# Patient Record
Sex: Male | Born: 1935 | Race: Black or African American | Hispanic: No | Marital: Married | Smoking: Former smoker
Health system: Southern US, Community
[De-identification: ages and names within clinical notes are randomized; demographics above are authoritative.]

## PROBLEM LIST (undated history)

## (undated) DIAGNOSIS — H5440 Blindness, one eye, unspecified eye: Secondary | ICD-10-CM

## (undated) DIAGNOSIS — D126 Benign neoplasm of colon, unspecified: Secondary | ICD-10-CM

## (undated) DIAGNOSIS — Z9289 Personal history of other medical treatment: Secondary | ICD-10-CM

## (undated) DIAGNOSIS — F329 Major depressive disorder, single episode, unspecified: Secondary | ICD-10-CM

## (undated) DIAGNOSIS — J189 Pneumonia, unspecified organism: Secondary | ICD-10-CM

## (undated) DIAGNOSIS — F419 Anxiety disorder, unspecified: Secondary | ICD-10-CM

## (undated) DIAGNOSIS — C61 Malignant neoplasm of prostate: Secondary | ICD-10-CM

## (undated) DIAGNOSIS — E785 Hyperlipidemia, unspecified: Secondary | ICD-10-CM

## (undated) DIAGNOSIS — I10 Essential (primary) hypertension: Secondary | ICD-10-CM

## (undated) DIAGNOSIS — M1990 Unspecified osteoarthritis, unspecified site: Secondary | ICD-10-CM

## (undated) DIAGNOSIS — K5900 Constipation, unspecified: Secondary | ICD-10-CM

## (undated) DIAGNOSIS — K219 Gastro-esophageal reflux disease without esophagitis: Secondary | ICD-10-CM

## (undated) DIAGNOSIS — E119 Type 2 diabetes mellitus without complications: Secondary | ICD-10-CM

## (undated) DIAGNOSIS — R008 Other abnormalities of heart beat: Secondary | ICD-10-CM

## (undated) DIAGNOSIS — F32A Depression, unspecified: Secondary | ICD-10-CM

## (undated) HISTORY — DX: Blindness, one eye, unspecified eye: H54.40

## (undated) HISTORY — PX: EXPLORATORY LAPAROTOMY: SUR591

## (undated) HISTORY — DX: Hyperlipidemia, unspecified: E78.5

## (undated) HISTORY — DX: Benign neoplasm of colon, unspecified: D12.6

## (undated) HISTORY — PX: COLONOSCOPY W/ BIOPSIES AND POLYPECTOMY: SHX1376

## (undated) HISTORY — DX: Anxiety disorder, unspecified: F41.9

## (undated) HISTORY — DX: Major depressive disorder, single episode, unspecified: F32.9

## (undated) HISTORY — DX: Depression, unspecified: F32.A

## (undated) HISTORY — DX: Essential (primary) hypertension: I10

## (undated) HISTORY — DX: Malignant neoplasm of prostate: C61

## (undated) HISTORY — PX: EYE SURGERY: SHX253

## (undated) HISTORY — DX: Other abnormalities of heart beat: R00.8

## (undated) SURGERY — TRABECULECTOMY
Anesthesia: Monitor Anesthesia Care | Laterality: Left

---
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
## 1973-01-22 DIAGNOSIS — Z9289 Personal history of other medical treatment: Secondary | ICD-10-CM

## 1973-01-22 HISTORY — DX: Personal history of other medical treatment: Z92.89

## 2003-01-23 DIAGNOSIS — C61 Malignant neoplasm of prostate: Secondary | ICD-10-CM

## 2003-01-23 HISTORY — DX: Malignant neoplasm of prostate: C61

## 2003-12-09 ENCOUNTER — Ambulatory Visit: Payer: Self-pay | Admitting: Internal Medicine

## 2004-06-14 ENCOUNTER — Ambulatory Visit: Payer: Self-pay | Admitting: Internal Medicine

## 2004-09-04 ENCOUNTER — Ambulatory Visit: Admission: RE | Admit: 2004-09-04 | Discharge: 2004-11-22 | Payer: Self-pay | Admitting: Radiation Oncology

## 2004-09-20 ENCOUNTER — Encounter: Admission: RE | Admit: 2004-09-20 | Discharge: 2004-09-20 | Payer: Self-pay | Admitting: Urology

## 2004-10-16 ENCOUNTER — Ambulatory Visit (HOSPITAL_COMMUNITY): Admission: RE | Admit: 2004-10-16 | Discharge: 2004-10-16 | Payer: Self-pay | Admitting: Urology

## 2004-10-16 ENCOUNTER — Ambulatory Visit (HOSPITAL_BASED_OUTPATIENT_CLINIC_OR_DEPARTMENT_OTHER): Admission: RE | Admit: 2004-10-16 | Discharge: 2004-10-16 | Payer: Self-pay | Admitting: Urology

## 2004-11-10 ENCOUNTER — Ambulatory Visit: Payer: Self-pay | Admitting: Internal Medicine

## 2004-11-27 ENCOUNTER — Encounter: Admission: RE | Admit: 2004-11-27 | Discharge: 2004-12-07 | Payer: Self-pay | Admitting: Internal Medicine

## 2005-10-16 ENCOUNTER — Ambulatory Visit: Payer: Self-pay | Admitting: Internal Medicine

## 2005-11-07 ENCOUNTER — Ambulatory Visit: Payer: Self-pay | Admitting: Internal Medicine

## 2005-12-31 ENCOUNTER — Ambulatory Visit: Payer: Self-pay | Admitting: Internal Medicine

## 2005-12-31 LAB — CONVERTED CEMR LAB
Chol/HDL Ratio, serum: 2.4
Cholesterol: 127 mg/dL (ref 0–200)
Creatinine,U: 124.2 mg/dL
HDL: 52.6 mg/dL (ref >39.0)
Hgb A1c MFr Bld: 7 % — ABNORMAL HIGH (ref 4.6–6.0)

## 2006-01-04 ENCOUNTER — Ambulatory Visit: Payer: Self-pay | Admitting: Internal Medicine

## 2006-01-16 ENCOUNTER — Ambulatory Visit: Payer: Self-pay | Admitting: Gastroenterology

## 2006-01-18 ENCOUNTER — Ambulatory Visit: Payer: Self-pay | Admitting: Gastroenterology

## 2006-01-18 ENCOUNTER — Encounter (INDEPENDENT_AMBULATORY_CARE_PROVIDER_SITE_OTHER): Payer: Self-pay | Admitting: Specialist

## 2006-08-15 ENCOUNTER — Ambulatory Visit: Payer: Self-pay | Admitting: Internal Medicine

## 2006-08-15 DIAGNOSIS — R634 Abnormal weight loss: Secondary | ICD-10-CM

## 2006-08-15 LAB — CONVERTED CEMR LAB
HDL goal, serum: 40 mg/dL
LDL Goal: 160 mg/dL

## 2006-08-19 ENCOUNTER — Encounter: Payer: Self-pay | Admitting: Internal Medicine

## 2006-08-21 LAB — CONVERTED CEMR LAB
ALT: 15 units/L (ref 0–53)
BUN: 15 mg/dL (ref 6–23)
Creatinine, Ser: 1.1 mg/dL (ref 0.4–1.5)
Creatinine,U: 145.5 mg/dL
Eosinophils Absolute: 0.1 10*3/uL (ref 0.0–0.6)
Free T4: 0.9 ng/dL (ref 0.6–1.6)
Hemoglobin: 13.6 g/dL (ref 13.0–17.0)
Hgb A1c MFr Bld: 6.9 % — ABNORMAL HIGH (ref 4.6–6.0)
MCV: 90.1 fL (ref 78.0–100.0)
Platelets: 140 10*3/uL — ABNORMAL LOW (ref 150–400)
Potassium: 4.3 meq/L (ref 3.5–5.1)
RBC: 4.58 M/uL (ref 4.22–5.81)
TSH: 0.78 microintl units/mL (ref 0.35–5.50)
WBC: 3.2 10*3/uL — ABNORMAL LOW (ref 4.5–10.5)

## 2006-08-26 ENCOUNTER — Encounter (INDEPENDENT_AMBULATORY_CARE_PROVIDER_SITE_OTHER): Payer: Self-pay | Admitting: *Deleted

## 2006-08-28 ENCOUNTER — Encounter: Payer: Self-pay | Admitting: Internal Medicine

## 2006-09-05 ENCOUNTER — Ambulatory Visit: Payer: Self-pay | Admitting: Internal Medicine

## 2006-09-05 LAB — CONVERTED CEMR LAB: LDL Goal: 100 mg/dL

## 2006-09-16 ENCOUNTER — Ambulatory Visit: Payer: Self-pay | Admitting: Internal Medicine

## 2006-09-16 ENCOUNTER — Emergency Department (HOSPITAL_COMMUNITY): Admission: EM | Admit: 2006-09-16 | Discharge: 2006-09-16 | Payer: Self-pay | Admitting: Emergency Medicine

## 2006-09-20 ENCOUNTER — Ambulatory Visit: Payer: Self-pay

## 2006-09-20 ENCOUNTER — Encounter: Payer: Self-pay | Admitting: Internal Medicine

## 2006-10-22 ENCOUNTER — Encounter: Payer: Self-pay | Admitting: Internal Medicine

## 2006-10-23 ENCOUNTER — Encounter: Payer: Self-pay | Admitting: Internal Medicine

## 2006-11-25 ENCOUNTER — Ambulatory Visit: Payer: Self-pay | Admitting: Internal Medicine

## 2006-11-25 LAB — CONVERTED CEMR LAB
ALT: 16 units/L (ref 0–53)
AST: 26 units/L (ref 0–37)
Direct LDL: 146.4 mg/dL
HDL: 59.5 mg/dL (ref >39.0)
Hgb A1c MFr Bld: 6.8 % — ABNORMAL HIGH (ref 4.6–6.0)
Total CHOL/HDL Ratio: 3.6

## 2006-12-02 ENCOUNTER — Ambulatory Visit: Payer: Self-pay | Admitting: Internal Medicine

## 2007-01-23 HISTORY — PX: REFRACTIVE SURGERY: SHX103

## 2007-01-31 ENCOUNTER — Telehealth (INDEPENDENT_AMBULATORY_CARE_PROVIDER_SITE_OTHER): Payer: Self-pay | Admitting: *Deleted

## 2007-02-05 ENCOUNTER — Encounter: Payer: Self-pay | Admitting: Internal Medicine

## 2007-02-25 ENCOUNTER — Ambulatory Visit: Payer: Self-pay | Admitting: Internal Medicine

## 2007-02-25 LAB — CONVERTED CEMR LAB
HDL: 58.1 mg/dL (ref >39.0)
LDL Cholesterol: 66 mg/dL (ref 0–99)
Total CHOL/HDL Ratio: 2.4
Triglycerides: 65 mg/dL (ref 0–149)
VLDL: 13 mg/dL (ref 0–40)

## 2007-03-04 ENCOUNTER — Ambulatory Visit: Payer: Self-pay | Admitting: Internal Medicine

## 2007-03-04 DIAGNOSIS — E782 Mixed hyperlipidemia: Secondary | ICD-10-CM

## 2007-03-13 ENCOUNTER — Encounter (INDEPENDENT_AMBULATORY_CARE_PROVIDER_SITE_OTHER): Payer: Self-pay | Admitting: *Deleted

## 2007-03-13 LAB — CONVERTED CEMR LAB
Creatinine,U: 138.2 mg/dL
Hgb A1c MFr Bld: 6.4 % — ABNORMAL HIGH (ref 4.6–6.0)
Microalb, Ur: 1.5 mg/dL (ref 0.0–1.9)

## 2007-04-08 ENCOUNTER — Telehealth (INDEPENDENT_AMBULATORY_CARE_PROVIDER_SITE_OTHER): Payer: Self-pay | Admitting: *Deleted

## 2007-05-21 ENCOUNTER — Telehealth (INDEPENDENT_AMBULATORY_CARE_PROVIDER_SITE_OTHER): Payer: Self-pay | Admitting: *Deleted

## 2007-05-22 ENCOUNTER — Ambulatory Visit: Payer: Self-pay | Admitting: Internal Medicine

## 2007-09-01 ENCOUNTER — Encounter (INDEPENDENT_AMBULATORY_CARE_PROVIDER_SITE_OTHER): Payer: Self-pay | Admitting: *Deleted

## 2007-09-01 ENCOUNTER — Ambulatory Visit: Payer: Self-pay | Admitting: Internal Medicine

## 2007-09-07 LAB — CONVERTED CEMR LAB
Albumin: 4.1 g/dL (ref 3.5–5.2)
Alkaline Phosphatase: 84 units/L (ref 39–117)
Bilirubin, Direct: 0.1 mg/dL (ref 0.0–0.3)
Eosinophils Absolute: 0.1 10*3/uL (ref 0.0–0.7)
Hemoglobin: 13.4 g/dL (ref 13.0–17.0)
MCV: 90.5 fL (ref 78.0–100.0)
Monocytes Absolute: 0.4 10*3/uL (ref 0.1–1.0)
Monocytes Relative: 9.7 % (ref 3.0–12.0)
Neutro Abs: 2.6 10*3/uL (ref 1.4–7.7)
Neutrophils Relative %: 68.3 % (ref 43.0–77.0)
RDW: 13.8 % (ref 11.5–14.6)
Total Bilirubin: 0.9 mg/dL (ref 0.3–1.2)

## 2007-09-08 ENCOUNTER — Encounter (INDEPENDENT_AMBULATORY_CARE_PROVIDER_SITE_OTHER): Payer: Self-pay | Admitting: *Deleted

## 2007-09-17 ENCOUNTER — Encounter: Payer: Self-pay | Admitting: Internal Medicine

## 2007-11-17 ENCOUNTER — Ambulatory Visit: Payer: Self-pay | Admitting: Internal Medicine

## 2008-02-02 ENCOUNTER — Ambulatory Visit: Payer: Self-pay | Admitting: Internal Medicine

## 2008-04-05 ENCOUNTER — Ambulatory Visit: Payer: Self-pay | Admitting: Internal Medicine

## 2008-04-11 LAB — CONVERTED CEMR LAB
BUN: 18 mg/dL (ref 6–23)
Creatinine, Ser: 1.1 mg/dL (ref 0.4–1.5)
Direct LDL: 153.6 mg/dL
Microalb, Ur: 0.6 mg/dL (ref 0.0–1.9)
Total CHOL/HDL Ratio: 3.8
VLDL: 13 mg/dL (ref 0–40)

## 2008-04-13 ENCOUNTER — Ambulatory Visit: Payer: Self-pay | Admitting: Internal Medicine

## 2008-06-22 ENCOUNTER — Ambulatory Visit: Payer: Self-pay | Admitting: Internal Medicine

## 2008-06-22 LAB — CONVERTED CEMR LAB
AST: 18 units/L (ref 0–37)
Alkaline Phosphatase: 96 units/L (ref 39–117)
Bilirubin, Direct: 0 mg/dL (ref 0.0–0.3)
HDL: 64.9 mg/dL (ref >39.00)
Hgb A1c MFr Bld: 6.7 % — ABNORMAL HIGH (ref 4.6–6.5)
Total Bilirubin: 0.9 mg/dL (ref 0.3–1.2)
Total CHOL/HDL Ratio: 3
Total Protein: 7.4 g/dL (ref 6.0–8.3)
Triglycerides: 76 mg/dL (ref 0.0–149.0)
VLDL: 15.2 mg/dL (ref 0.0–40.0)

## 2008-06-25 ENCOUNTER — Ambulatory Visit: Payer: Self-pay | Admitting: Internal Medicine

## 2008-06-25 LAB — CONVERTED CEMR LAB: BUN: 16 mg/dL (ref 6–23)

## 2008-06-28 ENCOUNTER — Encounter (INDEPENDENT_AMBULATORY_CARE_PROVIDER_SITE_OTHER): Payer: Self-pay | Admitting: *Deleted

## 2008-06-29 ENCOUNTER — Ambulatory Visit: Payer: Self-pay | Admitting: Cardiology

## 2008-07-01 ENCOUNTER — Encounter (INDEPENDENT_AMBULATORY_CARE_PROVIDER_SITE_OTHER): Payer: Self-pay | Admitting: *Deleted

## 2008-09-28 ENCOUNTER — Ambulatory Visit: Payer: Self-pay | Admitting: Internal Medicine

## 2008-10-03 LAB — CONVERTED CEMR LAB: Creatinine,U: 110.8 mg/dL

## 2008-10-04 ENCOUNTER — Encounter (INDEPENDENT_AMBULATORY_CARE_PROVIDER_SITE_OTHER): Payer: Self-pay | Admitting: *Deleted

## 2008-10-05 ENCOUNTER — Ambulatory Visit: Payer: Self-pay | Admitting: Internal Medicine

## 2008-10-05 DIAGNOSIS — Z8546 Personal history of malignant neoplasm of prostate: Secondary | ICD-10-CM | POA: Insufficient documentation

## 2008-10-05 DIAGNOSIS — Z860100 Personal history of colon polyps, unspecified: Secondary | ICD-10-CM | POA: Insufficient documentation

## 2008-10-10 LAB — CONVERTED CEMR LAB
ALT: 13 units/L (ref 0–53)
Alkaline Phosphatase: 83 units/L (ref 39–117)
BUN: 16 mg/dL (ref 6–23)
Bilirubin, Direct: 0 mg/dL (ref 0.0–0.3)
Creatinine, Ser: 1 mg/dL (ref 0.4–1.5)
Eosinophils Absolute: 0.1 10*3/uL (ref 0.0–0.7)
Free T4: 1.2 ng/dL (ref 0.6–1.6)
Hemoglobin: 13.4 g/dL (ref 13.0–17.0)
Lymphs Abs: 0.8 10*3/uL (ref 0.7–4.0)
MCHC: 32.1 g/dL (ref 30.0–36.0)
MCV: 91.3 fL (ref 78.0–100.0)
Neutrophils Relative %: 55 % (ref 43.0–77.0)
Potassium: 4.4 meq/L (ref 3.5–5.1)
RBC: 4.57 M/uL (ref 4.22–5.81)
RDW: 13 % (ref 11.5–14.6)
TSH: 0.61 microintl units/mL (ref 0.35–5.50)
Total Bilirubin: 0.8 mg/dL (ref 0.3–1.2)
Total Protein: 7.3 g/dL (ref 6.0–8.3)

## 2008-10-11 ENCOUNTER — Encounter (INDEPENDENT_AMBULATORY_CARE_PROVIDER_SITE_OTHER): Payer: Self-pay | Admitting: *Deleted

## 2008-10-19 ENCOUNTER — Ambulatory Visit: Payer: Self-pay | Admitting: Internal Medicine

## 2008-10-19 LAB — CONVERTED CEMR LAB
OCCULT 1: NEGATIVE
OCCULT 2: NEGATIVE
OCCULT 3: NEGATIVE

## 2008-10-20 ENCOUNTER — Encounter (INDEPENDENT_AMBULATORY_CARE_PROVIDER_SITE_OTHER): Payer: Self-pay | Admitting: *Deleted

## 2008-10-27 ENCOUNTER — Ambulatory Visit: Payer: Self-pay | Admitting: Internal Medicine

## 2009-03-25 ENCOUNTER — Ambulatory Visit: Payer: Self-pay | Admitting: Internal Medicine

## 2009-04-05 LAB — CONVERTED CEMR LAB
Basophils Relative: 1 % (ref 0.0–3.0)
Eosinophils Absolute: 0.1 10*3/uL (ref 0.0–0.7)
Eosinophils Relative: 3.3 % (ref 0.0–5.0)
HCT: 41.5 % (ref 39.0–52.0)
Hemoglobin: 12.9 g/dL — ABNORMAL LOW (ref 13.0–17.0)
Lymphocytes Relative: 25.7 % (ref 12.0–46.0)
MCV: 91.9 fL (ref 78.0–100.0)
Platelets: 127 10*3/uL — ABNORMAL LOW (ref 150.0–400.0)
RBC: 4.52 M/uL (ref 4.22–5.81)
RDW: 13.4 % (ref 11.5–14.6)
WBC: 3 10*3/uL — ABNORMAL LOW (ref 4.5–10.5)

## 2009-10-25 ENCOUNTER — Ambulatory Visit: Payer: Self-pay | Admitting: Internal Medicine

## 2009-10-28 LAB — CONVERTED CEMR LAB
Basophils Absolute: 0 10*3/uL (ref 0.0–0.1)
Basophils Relative: 1 % (ref 0.0–3.0)
Eosinophils Absolute: 0.2 10*3/uL (ref 0.0–0.7)
Folate: >20 ng/mL
Hgb A1c MFr Bld: 6.7 % — ABNORMAL HIGH (ref 4.6–6.5)
MCHC: 33.3 g/dL (ref 30.0–36.0)
MCV: 91.1 fL (ref 78.0–100.0)
Monocytes Absolute: 0.3 10*3/uL (ref 0.1–1.0)
Neutro Abs: 1.9 10*3/uL (ref 1.4–7.7)
Platelets: 122 10*3/uL — ABNORMAL LOW (ref 150.0–400.0)
Saturation Ratios: 18.9 % — ABNORMAL LOW (ref 20.0–50.0)
Transferrin: 241.5 mg/dL (ref 212.0–360.0)
Vitamin B-12: 892 pg/mL (ref 211–911)

## 2009-11-04 ENCOUNTER — Ambulatory Visit: Payer: Self-pay | Admitting: Internal Medicine

## 2009-11-04 DIAGNOSIS — D61818 Other pancytopenia: Secondary | ICD-10-CM

## 2009-11-04 DIAGNOSIS — E119 Type 2 diabetes mellitus without complications: Secondary | ICD-10-CM | POA: Insufficient documentation

## 2010-02-21 NOTE — Assessment & Plan Note (Signed)
Summary: Follow-up on Labs/scm   Vital Signs:  Patient profile:   77 year old male Weight:      172.6 pounds BMI:     25.58 Pulse rate:   72 / minute Resp:     16 per minute BP sitting:   118 / 60  (left arm) Cuff size:   large  Vitals Entered By: Shonna Chock CMA (November 04, 2009 4:42 PM) CC: Follow-up on labs (copy given), Type 2 diabetes mellitus follow-up, Heartburn   CC:  Follow-up on labs (copy given), Type 2 diabetes mellitus follow-up, and Heartburn.  History of Present Illness: Type 2 Diabetes Mellitus Follow-Up      This is a 77 year old man who presents for Type 2 diabetes mellitus follow-up.  The patient reports blurred vision due to Glaucoma and weight loss of 15# purposely through exercise, but denies polyuria, polydipsia, self managed hypoglycemia, and numbness of extremities.  The patient denies the following symptoms: neuropathic pain, chest pain, vomiting, orthostatic symptoms, poor wound healing, intermittent claudication, vision loss, and foot ulcer.  Since the last visit the patient reports poor dietary compliance, exercising regularly( see above), and not monitoring blood glucose.  Since the last visit, the patient reports having had eye care by an Ophthalmologist ( he is seen every 3 months for glaucoma).   A1c is stable @ 6.7%. Anemia:      The patient also presents  for anemia evaluation.  HCT was 38.9  on 10/25/2009. Seriallly from 2008 to now it has been 41.3, 39.8(2009), 41.7(2010), & 41.5 (20101)The patient denies acid reflux, epigastric pain, and trouble swallowing.  The patient denies the following alarm features: melena, hematemesis, and involuntary weight loss >5%.  He has had colonoscopies with polypectomy X 2(due 2012). Also serial CBCs have revealed chronically low WBC: 3200(2008), 1610(9604), 2700(2010)& 3000(2010) with normal differentials. Platelet counts during same period: 140,000; 119,000;110,000 & 127, 000 . Now platelet  # 122,000. B12, folate acid  & iron stores are normal.  Allergies: 1)  ! Amaryl (Glimepiride)  Review of Systems General:  Denies chills and sweats. ENT:  Denies nosebleeds. Resp:  Denies coughing up blood. GU:  Denies hematuria. Heme:  Denies abnormal bruising and bleeding.  Physical Exam  General:  Appears younger than age,well-nourished,in no acute distress; alert,appropriate and cooperative throughout examination Lungs:  Normal respiratory effort, chest expands symmetrically. Lungs are clear to auscultation, no crackles or wheezes. Heart:  regular rhythm, no murmur, no gallop, no rub, no JVD, no HJR, and bradycardia.  S4 with slurring Abdomen:  Bowel sounds positive,abdomen soft and non-tender without masses, organomegaly or hernias noted. Midline op scars Pulses:  R and L carotid,radial,dorsalis pedis and posterior tibial pulses are full and equal bilaterally Extremities:  No clubbing, cyanosis, edema. Deformed R great nail. Pes planus Neurologic:  alert & oriented X3 and sensation intact to light touch over feet.   Skin:  Intact without suspicious lesions or rashes Cervical Nodes:  No lymphadenopathy noted Axillary Nodes:  No palpable lymphadenopathy Psych:  memory intact for recent and remote, normally interactive, and good eye contact.     Impression & Recommendations:  Problem # 1:  DIABETES MELLITUS (ICD-250.00)  His updated medication list for this problem includes:    Metformin Hcl 1000 Mg Tabs (Metformin hcl) .Marland Kitchen... Take one tab bid    Aspirin Adult Low Strength 81 Mg Tbec (Aspirin) .Marland Kitchen... As needed    Lisinopril 10 Mg Tabs (Lisinopril) .Marland Kitchen... 1 once daily (report lip/tongue swelling)  Problem # 2:  PANCYTOPENIA (ICD-284.1)  Complete Medication List: 1)  Lumigan 0.03 % Soln (Bimatoprost) .... Use daily 2)  Cosopt 2-0.5 % Soln (Dorzolamide-timolol) .... Use daily 3)  Alphagan P 0.1 % Soln (Brimonidine tartrate) .Marland Kitchen.. 1 gtt in each eye two times a day 4)  Metformin Hcl 1000 Mg Tabs (Metformin  hcl) .... Take one tab bid 5)  Multivitamin  6)  Active C  7)  Viagra 100 Mg Tabs (Sildenafil citrate) .... Take as directed 8)  Tylenol 2 Tabs Bid  9)  Aspirin Adult Low Strength 81 Mg Tbec (Aspirin) .... As needed 10)  Multiple Vitamin Tabs (Multiple vitamin) .Marland Kitchen.. 1 by mouth once daily 11)  Vitamin D 1000 Unit Tabs (Cholecalciferol) .Marland Kitchen.. 1 by mouth once daily 12)  Vitamin C 1000 Mg Tabs (Ascorbic acid) .Marland Kitchen.. 1 by mouth once daily 13)  Vitamin B-6 100 Mg Tabs (Pyridoxine hcl) .Marland Kitchen.. 1 by mouth once daily 14)  Fish Oil 300 Mg Caps (Omega-3 fatty acids) .Marland Kitchen.. 1 by mouth once daily 15)  Excedrin Extra Strength 250-250-65 Mg Tabs (Aspirin-acetaminophen-caffeine) .... As needed 16)  Pravastatin Sodium 40 Mg Tabs (Pravastatin sodium) .Marland Kitchen.. 1 at bedtime 17)  Lisinopril 10 Mg Tabs (Lisinopril) .Marland Kitchen.. 1 once daily (report lip/tongue swelling)  Patient Instructions: 1)  Please schedule a follow-up appointment in 6 months. CBC & dif (284.1); 2)  Bun,Creat,K+(995.20); 3)  HbgA1C (250.00) 4)  Urine Microalbumin (250.00) 5)  Check your blood sugars regularly. If your readings are usually above : 150 or below 90 ON AVERAGE  you should contact our office. Please have a CBC & dif done with any acute infection.  Prescriptions: METFORMIN HCL 1000 MG  TABS (METFORMIN HCL) take one tab bid  #180 Each x 1   Entered and Authorized by:   Marga Melnick MD   Signed by:   Marga Melnick MD on 11/04/2009   Method used:   Print then Give to Patient   RxID:   [id number]    Orders Added: 1)  Est. Patient Level IV [56213]

## 2010-02-23 ENCOUNTER — Telehealth (INDEPENDENT_AMBULATORY_CARE_PROVIDER_SITE_OTHER): Payer: Self-pay | Admitting: *Deleted

## 2010-03-01 NOTE — Progress Notes (Signed)
Summary: NEEDS COLONOSCOPY  Phone Note Call from Patient Call back at Home Phone [phone]   Caller: Patient Summary of Call: Patient called to report that he gets a colonoscopy every five years and said 12/2009 marked his five year anniversary----Dr Russella Dar in this area is the doctor that performed the last colonoscopy---please set up a colonoscopy appointment and call him at [phone]  *****NEEDS APPOINTMENT ON A TUESDAY OR THURSDAY**** Initial call taken by: Jerolyn Shin,  February 23, 2010 9:23 AM  Follow-up for Phone Call        Since patient has had a colonscopy there before he can set up own appt. Patient is aware of this and phone number was given for patient to sch appt. He will call this morning to do so.  Follow-up by: Harold Barban,  February 23, 2010 9:28 AM

## 2010-03-07 ENCOUNTER — Encounter: Payer: Self-pay | Admitting: Internal Medicine

## 2010-03-07 ENCOUNTER — Ambulatory Visit (INDEPENDENT_AMBULATORY_CARE_PROVIDER_SITE_OTHER): Payer: Medicare Other | Admitting: Internal Medicine

## 2010-03-07 DIAGNOSIS — J069 Acute upper respiratory infection, unspecified: Secondary | ICD-10-CM

## 2010-03-07 DIAGNOSIS — J029 Acute pharyngitis, unspecified: Secondary | ICD-10-CM

## 2010-03-07 DIAGNOSIS — J209 Acute bronchitis, unspecified: Secondary | ICD-10-CM

## 2010-03-15 NOTE — Assessment & Plan Note (Signed)
Summary: SORE THROAT,COUGH/RH......   Vital Signs:  Patient profile:   77 year old male Weight:      176 pounds BMI:     26.08 Temp:     98.6 degrees F oral Pulse rate:   76 / minute Resp:     15 per minute BP sitting:   120 / 76  (left arm) Cuff size:   large  Vitals Entered By: Shonna Chock CMA (March 07, 2010 4:38 PM) CC: Sore throat(wores at night) and cough x 3 days, symptoms off/on x 2 weeks , URI symptoms   CC:  Sore throat(wores at night) and cough x 3 days, symptoms off/on x 2 weeks , and URI symptoms.  History of Present Illness:    Onset 2 weeks ago as ST which has progressed; he now reports dry cough, but denies purulent nasal discharge.  The patient denies fever (but taking antipyretics for the ST), dyspnea, and wheezing.  The patient also reports headache. in am  The patient denies the following risk factors for Strep sinusitis: bilateral facial pain, tooth pain, and tender adenopathy.  Rx: OTC cough drops,Tylenol, ASA  Current Medications (verified): 1)  Lumigan 0.03 %  Soln (Bimatoprost) .... Use Daily 2)  Cosopt 2-0.5 %  Soln (Dorzolamide-Timolol) .... Use Daily 3)  Alphagan P 0.1 %  Soln (Brimonidine Tartrate) .Marland Kitchen.. 1 Gtt in Each Eye Two Times A Day 4)  Metformin Hcl 1000 Mg  Tabs (Metformin Hcl) .... Take One Tab Bid 5)  Multivitamin 6)  Active C 7)  Viagra 100 Mg  Tabs (Sildenafil Citrate) .... Take As Directed 8)  Tylenol 2 Tabs Bid 9)  Aspirin Adult Low Strength 81 Mg  Tbec (Aspirin) .... As Needed 10)  Multiple Vitamin   Tabs (Multiple Vitamin) .Marland Kitchen.. 1 By Mouth Once Daily 11)  Vitamin D 1000 Unit  Tabs (Cholecalciferol) .Marland Kitchen.. 1 By Mouth Once Daily 12)  Vitamin C 1000 Mg  Tabs (Ascorbic Acid) .Marland Kitchen.. 1 By Mouth Once Daily 13)  Vitamin B-6 100 Mg  Tabs (Pyridoxine Hcl) .Marland Kitchen.. 1 By Mouth Once Daily 14)  Fish Oil 300 Mg  Caps (Omega-3 Fatty Acids) .Marland Kitchen.. 1 By Mouth Once Daily 15)  Excedrin Extra Strength 250-250-65 Mg Tabs (Aspirin-Acetaminophen-Caffeine) .... As  Needed  Allergies: 1)  ! Amaryl (Glimepiride)  Physical Exam  General:  well-nourished,in no acute distress; alert,appropriate and cooperative throughout examination Ears:  External ear exam shows no significant lesions or deformities.  Otoscopic examination reveals clear canals, tympanic membranes are intact bilaterally without bulging, retraction, inflammation or discharge. Hearing is grossly normal bilaterally. Nose:  External nasal examination shows no deformity or inflammation. Nasal mucosa are pink and moist without lesions or exudates. Mouth:  Oral mucosa and oropharynx without lesions or exudates.   marked pharyngeal erythema.   Lungs:  Normal respiratory effort, chest expands symmetrically. Lungs are clear to auscultation, no crackles or wheezes. Skin:  Intact without suspicious lesions or rashes. Skin damp  Cervical Nodes:  No lymphadenopathy noted Axillary Nodes:  No palpable lymphadenopathy   Impression & Recommendations:  Problem # 1:  BRONCHITIS-ACUTE (ICD-466.0)  His updated medication list for this problem includes:    Azithromycin 250 Mg Tabs (Azithromycin) .Marland Kitchen... As per pack    Hydromet 5-1.5 Mg/4ml Syrp (Hydrocodone-homatropine) .Marland Kitchen... 1 tsp every 6 hrs as needed  Problem # 2:  URI (ICD-465.9)  His updated medication list for this problem includes:    Aspirin Adult Low Strength 81 Mg Tbec (Aspirin) .Marland Kitchen... As needed  Hydromet 5-1.5 Mg/67ml Syrp (Hydrocodone-homatropine) .Marland Kitchen... 1 tsp every 6 hrs as needed  Complete Medication List: 1)  Lumigan 0.03 % Soln (Bimatoprost) .... Use daily 2)  Cosopt 2-0.5 % Soln (Dorzolamide-timolol) .... Use daily 3)  Alphagan P 0.1 % Soln (Brimonidine tartrate) .Marland Kitchen.. 1 gtt in each eye two times a day 4)  Metformin Hcl 1000 Mg Tabs (Metformin hcl) .... Take one tab bid 5)  Multivitamin  6)  Active C  7)  Viagra 100 Mg Tabs (Sildenafil citrate) .... Take as directed 8)  Tylenol 2 Tabs Bid  9)  Aspirin Adult Low Strength 81 Mg Tbec  (Aspirin) .... As needed 10)  Multiple Vitamin Tabs (Multiple vitamin) .Marland Kitchen.. 1 by mouth once daily 11)  Vitamin D 1000 Unit Tabs (Cholecalciferol) .Marland Kitchen.. 1 by mouth once daily 12)  Vitamin C 1000 Mg Tabs (Ascorbic acid) .Marland Kitchen.. 1 by mouth once daily 13)  Vitamin B-6 100 Mg Tabs (Pyridoxine hcl) .Marland Kitchen.. 1 by mouth once daily 14)  Fish Oil 300 Mg Caps (Omega-3 fatty acids) .Marland Kitchen.. 1 by mouth once daily 15)  Excedrin Extra Strength 250-250-65 Mg Tabs (Aspirin-acetaminophen-caffeine) .... As needed 16)  Azithromycin 250 Mg Tabs (Azithromycin) .... As per pack 17)  Hydromet 5-1.5 Mg/81ml Syrp (Hydrocodone-homatropine) .Marland Kitchen.. 1 tsp every 6 hrs as needed  Other Orders: Rapid Strep (13086)  Patient Instructions: 1)  Neti pot once daily as needed for head congestion. 2)  Drink as much NON dairy  fluid as you can tolerate for the next few days. Prescriptions: HYDROMET 5-1.5 MG/5ML SYRP (HYDROCODONE-HOMATROPINE) 1 tsp every 6 hrs as needed  #120 cc x 0   Entered and Authorized by:   Marga Melnick MD   Signed by:   Marga Melnick MD on 03/07/2010   Method used:   Printed then faxed to ...       [facility] [phone]* (retail)       [address]       [place], [place]  [address]       Ph: [phone]       Fax: [phone]   RxID:   [id number] AZITHROMYCIN 250 MG TABS (AZITHROMYCIN) as per pack  #1 x 0   Entered and Authorized by:   Marga Melnick MD   Signed by:   Marga Melnick MD on 03/07/2010   Method used:   Electronically to        [facility] [phone]* (retail)       [address]       [place], [place]  [address]       Ph: [phone]       Fax: [phone]   RxID:   [id number]    Orders Added: 1)  Rapid Strep [83151] 2)  Est. Patient Level III [76160]    Laboratory Results    Other Tests  Rapid Strep: negative
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
## 2010-06-09 NOTE — Op Note (Signed)
NAMEMANUS, WEEDMAN             ACCOUNT NO.:  [id number]   MEDICAL RECORD NO.:  [id number]          PATIENT TYPE:  AMB   LOCATION:  [facility]                         FACILITY:  [facility]   PHYSICIAN:  Lindaann Slough, M.D.  DATE OF BIRTH:  1933/04/11   DATE OF PROCEDURE:  10/16/2004  DATE OF DISCHARGE:                                 OPERATIVE REPORT   PREOPERATIVE DIAGNOSIS:  Carcinoma of prostate.   POSTOPERATIVE DIAGNOSIS:  Carcinoma of prostate.   PROCEDURE DONE:  I-125 seeds implantation and cystoscopy.   SURGEON:  1.  Dr. Brunilda Payor  2.  Dr. Dayton Scrape   ANESTHESIA:  General.   INDICATION:  The patient is a 77 year old male, who was found by biopsy to  have adenocarcinoma of prostate, Gleason score 6.  PSA on June 27, 2004, was  4.17.  Treatment options were discussed with the patient, and he chose to  have seeds implantation.  He is scheduled today for the procedure.   Under general anesthesia, the patient was prepped and draped and placed in  the dorsal lithotomy position.  The planning was done intraoperatively.  When Dr. Dayton Scrape was satisfied with the dosimetry, a total of 27 needles were  passed to the prostate, and a total of 60 seeds were implanted.  The dosage  per seed is 0.52 mCi per seed.  There is a good seed distribution.  Then the  Foley catheter that was previously placed in the bladder was removed.  A  flexible cystoscope was then inserted in the bladder.  There is no evidence  of seeds in the urethra nor in the bladder.  The bladder mucosa is normal.  There is no stone or tumor in the bladder, and the ureteral orifices are in  normal position and shape with clear efflux.  The cystoscope was then  removed.  A #16 Foley catheter was then inserted in the bladder.   The patient tolerated the procedure well and left the OR in satisfactory  condition to postanesthesia care unit.      Lindaann Slough, M.D.  Electronically Signed     MN/MEDQ  D:  10/16/2004  T:   10/16/2004  Job:  [id number]   cc:   Maryln Gottron, M.D.  Fax: [phone]

## 2010-06-09 NOTE — Letter (Signed)
January 04, 2006    Venita Lick. Russella Dar, MD, FACG  [address]  [place], [place] [address]   RE:  Dylan Orozco, Dylan Orozco  MRN:  [id number]  /  DOB:  November 26, 1933   Dear Judie Petit:   Would you be kind enough to see Dr. Lois Huxley for rectal bleeding?  He  had been out of town and had changed his diet significantly, and  developed significant constipation.  He finally employed the [employer]  enema, and produced 20 pounds of stool.  This was associated with  blood on and in the stool as well.   You performed a colonoscopy on him in October 2004, which revealed  internal hemorrhoids.   Bowel sounds were active with no evidence of surgical abdomen.  There  was faint pink mucoid material @ rectal exam  strongly positive on  hemoccult testing . His vital signs were stable with a pulse of 68,  respiratory rate of 14, and blood pressure 120/88.  Hemoglobin was 13.8.   At this visit, he also asked to review his diabetic status and lipids.  Labs from December the 12th were reviewed.  His lipids are well-  controlled, but diabetes is poorly controlled with a  hemoglobin A1c of 7.  His metformin was increased to 500 mg twice a day,  and his dietary non-compliance and it's associated risk discussed.   I appreciate you evaluating him.    Sincerely,      Titus Dubin. Alwyn Ren, MD,FACP,FCCP  Electronically Signed    [facility]  DD: 01/05/2006  DT: 01/05/2006  Job #: [id number]

## 2010-09-13 ENCOUNTER — Other Ambulatory Visit: Payer: Self-pay | Admitting: Internal Medicine

## 2010-09-13 NOTE — Telephone Encounter (Signed)
Patient Instructions: 1)  Please schedule a follow-up appointment in 6 months. CBC & dif (284.1); 2)  Bun,Creat,K+(995.20); 3)  HbgA1C (250.00) 4)  Urine Microalbumin (250.00) Copied from 11/04/2009

## 2010-09-15 ENCOUNTER — Other Ambulatory Visit: Payer: Self-pay | Admitting: Internal Medicine

## 2010-09-18 ENCOUNTER — Other Ambulatory Visit: Payer: Self-pay | Admitting: Internal Medicine

## 2010-09-18 NOTE — Telephone Encounter (Signed)
Patient Instructions: 1)  Please schedule a follow-up appointment in 6 months. CBC & dif (284.1); 2)  Bun,Creat,K+(995.20); 3)  HbgA1C (250.00) 4)  Urine Microalbumin (250.00)

## 2010-11-03 LAB — CBC
Hemoglobin: 13.1
MCHC: 32.1
MCV: 89.2
Platelets: 130 — ABNORMAL LOW
RBC: 4.57
RDW: 12.5
WBC: 3.6 — ABNORMAL LOW

## 2010-11-03 LAB — POCT CARDIAC MARKERS
Myoglobin, poc: 64.6
Operator id: 4708

## 2010-11-03 LAB — BASIC METABOLIC PANEL
Creatinine, Ser: 1.12
GFR calc non Af Amer: >60
Glucose, Bld: 105 — ABNORMAL HIGH
Potassium: 4.4

## 2010-11-03 LAB — PROTIME-INR
INR: 0.9
Prothrombin Time: 12.7

## 2010-11-03 LAB — DIFFERENTIAL
Basophils Relative: 2 — ABNORMAL HIGH
Eosinophils Relative: 6 — ABNORMAL HIGH
Lymphs Abs: 0.9
Neutro Abs: 2.1
Neutrophils Relative %: 57

## 2010-11-03 LAB — CK TOTAL AND CKMB (NOT AT ARMC): Relative Index: INVALID

## 2010-11-03 LAB — TROPONIN I: Troponin I: 0.02

## 2010-11-23 ENCOUNTER — Ambulatory Visit (INDEPENDENT_AMBULATORY_CARE_PROVIDER_SITE_OTHER): Payer: Medicare Other

## 2010-11-23 DIAGNOSIS — Z23 Encounter for immunization: Secondary | ICD-10-CM

## 2011-01-24 ENCOUNTER — Encounter: Payer: Self-pay | Admitting: Internal Medicine

## 2011-01-24 ENCOUNTER — Ambulatory Visit (INDEPENDENT_AMBULATORY_CARE_PROVIDER_SITE_OTHER): Payer: Medicare Other | Admitting: Internal Medicine

## 2011-01-24 DIAGNOSIS — E119 Type 2 diabetes mellitus without complications: Secondary | ICD-10-CM

## 2011-01-24 DIAGNOSIS — D61818 Other pancytopenia: Secondary | ICD-10-CM

## 2011-01-24 DIAGNOSIS — H401190 Primary open-angle glaucoma, unspecified eye, stage unspecified: Secondary | ICD-10-CM | POA: Insufficient documentation

## 2011-01-24 DIAGNOSIS — J209 Acute bronchitis, unspecified: Secondary | ICD-10-CM

## 2011-01-24 DIAGNOSIS — Z8546 Personal history of malignant neoplasm of prostate: Secondary | ICD-10-CM

## 2011-01-24 MED ORDER — HYDROCODONE-HOMATROPINE 5-1.5 MG/5ML PO SYRP: 5.0000 mL | ORAL_SOLUTION | Freq: Four times a day (QID) | ORAL | Status: AC | PRN

## 2011-01-24 MED ORDER — AZITHROMYCIN 250 MG PO TABS: ORAL_TABLET | ORAL | Status: AC

## 2011-01-24 NOTE — Assessment & Plan Note (Signed)
A1c &  lipids have not been checked recently. His last A1c was 6.7 in October 2011.

## 2011-01-24 NOTE — Progress Notes (Signed)
  Subjective:    Patient ID: Dylan Orozco, male    DOB: 1933-05-20, 78 y.o.   MRN: [id number]  HPI COUGH Onset:12/25 day as rhinitis & sneezing Trigger:ill tablemate Course:to severe cough 12/26 Treatment/efficacy:dayquil with some benefit Associated symptoms/signs:  URI symptoms: No facial pain, frontal headaches, nasal purulence, dental pain,sorethroat, or ear ache/otic discharge Extrinsic symptoms: No itchy eyes, sneezing, or angioedema now Infectious symptoms: No fever, chills, sweats, and purulent secretions Chest symptoms: No pleuritic pain, sputum production, hemoptysis, or wheezing. DOE GI symptoms: No dyspepsia, dysphagia, reflux symptoms Occupational/environmental exposures:no Smoking history:quit 1986 ACE inhibitor administration:no Past medical history/family history pulmonary disease: bro had asthma    Review of Systems     Objective:   Physical Exam General appearance:good health ;well nourished; no acute distress or increased work of breathing is present.  No  lymphadenopathy about the head, neck, or axilla noted.   Eyes: No conjunctival inflammation or lid edema is present.   Ears:  External ear exam shows no significant lesions or deformities.  Otoscopic examination reveals some wax bilaterally Nose:  External nasal examination shows no deformity or inflammation. Nasal mucosa are pink and moist without lesions or exudates. No septal dislocation or deviation.No obstruction to airflow.   Oral exam: Dental hygiene is good; lips and gums are healthy appearing.There is no oropharyngeal erythema or exudate noted.     Heart:  Normal rate and regular rhythm. S1 and S2 normal without gallop, click, rub or other extra sounds. Grade 1/2 systolic murmur  Lungs:Chest clear to auscultation; no wheezes, rhonchi,rales ,or rubs present.No increased work of breathing.    Extremities:  No cyanosis, edema  noted . Slight clubbing; pes planus   Skin: Warm & dry          Assessment & Plan:   #1 acute bronchitis Plan: See orders and recommendations

## 2011-01-24 NOTE — Assessment & Plan Note (Signed)
In October 2011 white blood count was 3200; hematocrit 38.9; and platelet count 122,000.

## 2011-01-24 NOTE — Patient Instructions (Signed)
Please  schedule fasting Labs : BMET,Lipids, hepatic panel, CBC & dif, TSH, A1c, urine microalbumin. PLEASE BRING THESE INSTRUCTIONS TO FOLLOW UP  LAB APPOINTMENT.This will guarantee correct labs are drawn, eliminating need for repeat blood sampling ( needle sticks ! ). Diagnoses /Codes: 250.00, 211.3,272.4

## 2011-01-25 ENCOUNTER — Other Ambulatory Visit: Payer: Self-pay | Admitting: Internal Medicine

## 2011-01-25 DIAGNOSIS — D126 Benign neoplasm of colon, unspecified: Secondary | ICD-10-CM

## 2011-01-25 DIAGNOSIS — E785 Hyperlipidemia, unspecified: Secondary | ICD-10-CM

## 2011-01-25 DIAGNOSIS — E119 Type 2 diabetes mellitus without complications: Secondary | ICD-10-CM

## 2011-01-26 ENCOUNTER — Other Ambulatory Visit: Payer: Self-pay | Admitting: Internal Medicine

## 2011-01-26 ENCOUNTER — Other Ambulatory Visit (INDEPENDENT_AMBULATORY_CARE_PROVIDER_SITE_OTHER): Payer: Medicare Other

## 2011-01-26 DIAGNOSIS — D126 Benign neoplasm of colon, unspecified: Secondary | ICD-10-CM

## 2011-01-26 DIAGNOSIS — E785 Hyperlipidemia, unspecified: Secondary | ICD-10-CM

## 2011-01-26 DIAGNOSIS — E119 Type 2 diabetes mellitus without complications: Secondary | ICD-10-CM

## 2011-01-26 LAB — CBC WITH DIFFERENTIAL/PLATELET
Basophils Absolute: 0 10*3/uL (ref 0.0–0.1)
Basophils Relative: 0.7 % (ref 0.0–3.0)
Eosinophils Absolute: 0.1 10*3/uL (ref 0.0–0.7)
Hemoglobin: 13.1 g/dL (ref 13.0–17.0)
Lymphs Abs: 1.2 10*3/uL (ref 0.7–4.0)
MCHC: 33.2 g/dL (ref 30.0–36.0)
MCV: 90 fl (ref 78.0–100.0)
Monocytes Absolute: 0.3 10*3/uL (ref 0.1–1.0)
Neutro Abs: 1.5 10*3/uL (ref 1.4–7.7)
RBC: 4.38 Mil/uL (ref 4.22–5.81)
RDW: 14.3 % (ref 11.5–14.6)

## 2011-01-26 LAB — LIPID PANEL
HDL: 57 mg/dL (ref >39.00)
VLDL: 10.2 mg/dL (ref 0.0–40.0)

## 2011-01-26 LAB — BASIC METABOLIC PANEL
BUN: 17 mg/dL (ref 6–23)
CO2: 27 mEq/L (ref 19–32)
Calcium: 9.1 mg/dL (ref 8.4–10.5)
GFR: 92.41 mL/min (ref >60.00)
Glucose, Bld: 113 mg/dL — ABNORMAL HIGH (ref 70–99)
Potassium: 4.2 mEq/L (ref 3.5–5.1)
Sodium: 142 mEq/L (ref 135–145)

## 2011-01-26 LAB — LDL CHOLESTEROL, DIRECT: Direct LDL: 144 mg/dL

## 2011-01-26 LAB — MICROALBUMIN / CREATININE URINE RATIO: Microalb, Ur: 0.9 mg/dL (ref 0.0–1.9)

## 2011-01-26 LAB — HEPATIC FUNCTION PANEL
Alkaline Phosphatase: 109 U/L (ref 39–117)
Bilirubin, Direct: 0 mg/dL (ref 0.0–0.3)
Total Bilirubin: 0.6 mg/dL (ref 0.3–1.2)

## 2011-02-02 ENCOUNTER — Encounter: Payer: Self-pay | Admitting: Gastroenterology

## 2011-02-05 ENCOUNTER — Ambulatory Visit (INDEPENDENT_AMBULATORY_CARE_PROVIDER_SITE_OTHER): Payer: Medicare Other | Admitting: Internal Medicine

## 2011-02-05 ENCOUNTER — Encounter: Payer: Self-pay | Admitting: Internal Medicine

## 2011-02-05 DIAGNOSIS — E119 Type 2 diabetes mellitus without complications: Secondary | ICD-10-CM

## 2011-02-05 DIAGNOSIS — E782 Mixed hyperlipidemia: Secondary | ICD-10-CM

## 2011-02-05 MED ORDER — PRAVASTATIN SODIUM 20 MG PO TABS: 20.0000 mg | ORAL_TABLET | Freq: Every day | ORAL | Status: DC

## 2011-02-05 MED ORDER — RAMIPRIL 2.5 MG PO CAPS: 2.5000 mg | ORAL_CAPSULE | Freq: Every day | ORAL | Status: DC

## 2011-02-05 NOTE — Patient Instructions (Addendum)
Please review Dr Gildardo Griffes book Eat, Drink & Be Healthy for dietary cholesterol information. .  Blood Pressure Goal  Ideally is an AVERAGE < 135/85. This AVERAGE should be calculated from @ least 5-7 BP readings taken @ different times of day on different days of week. You should not respond to isolated BP readings , but rather the AVERAGE for that week.  Please  schedule fasting Labs in 10-11 weeks : BMET,Lipids, hepatic panel, A1c,CK. PLEASE BRING THESE INSTRUCTIONS TO FOLLOW UP  LAB APPOINTMENT.This will guarantee correct labs are drawn, eliminating need for repeat blood sampling ( needle sticks ! ). Diagnoses /Codes: 250.00,272.4,995.20.  Ramipril is  an agent at that helps protect the kidneys from diabetes damage as well as helping control BP. Please stop it immediately if you have any numbness tingling or swelling of lips or tongue. This is an extremely rare adverse reaction.  Use either the prescription cough syrup or Robitussin-DM at bedtime as needed. Report fever, shortness of breath, or purulent secretions as discussed.

## 2011-02-05 NOTE — Assessment & Plan Note (Addendum)
Adequate control as documented by A1c of 6.9%.  He has no past medical history of hypertension. He should be on an ACE inhibitor prophylactically. Blood pressure goals discussed.

## 2011-02-05 NOTE — Assessment & Plan Note (Signed)
Pravastatin 20 mg at bedtime recommended with followup labs after 10 weeks.

## 2011-02-05 NOTE — Progress Notes (Signed)
  Subjective:    Patient ID: Dylan Orozco, male    DOB: 1933-07-05, 78 y.o.   MRN: [id number]  HPI Dyslipidemia assessment:    Family history of premature CAD/ MI: brother MI @ 47  Nutrition: no, increased egg intke .  Exercise: walking stairs 10-15X 3-4 X/ week . Diabetes : A1c 6.9% . HTN: usually 135/75-80. Smoking history  : 1995 .    Meds compliance: He is unsure why he is no longer: a statin   Lab results reviewed & risk discussed    Review of Systems ROS: fatigue: no; chest pain : no ;claudication: no; palpitations:no; abd pain/bowel changes: pain only with constipation ; myalgias:no;  syncope : no ; memory loss:no;skin changes: no.  He has some residual nonproductive cough at bedtime. He denies any fever, chills, sweats, or purulent secretions.     Objective:   Physical Exam He appears healthy and well-nourished; he is in no acute distress  No carotid bruits are present.  Heart rhythm and rate are normal with no significant murmurs or gallops. S4 with slurring at  LSB     Chest is clear with no increased work of breathing  There is no evidence of aortic aneurysm or renal artery bruits  He has no clubbing or edema.   Pedal pulses are intact   No ischemic skin changes are present         Assessment & Plan:

## 2011-03-06 ENCOUNTER — Ambulatory Visit (AMBULATORY_SURGERY_CENTER): Payer: Medicare Other | Admitting: *Deleted

## 2011-03-06 VITALS — Ht 69.5 in | Wt 170.0 lb

## 2011-03-06 DIAGNOSIS — Z1211 Encounter for screening for malignant neoplasm of colon: Secondary | ICD-10-CM

## 2011-03-06 MED ORDER — PEG-KCL-NACL-NASULF-NA ASC-C 100 G PO SOLR: ORAL | Status: DC

## 2011-03-20 ENCOUNTER — Ambulatory Visit (AMBULATORY_SURGERY_CENTER): Payer: Medicare Other | Admitting: Gastroenterology

## 2011-03-20 ENCOUNTER — Encounter: Payer: Self-pay | Admitting: Gastroenterology

## 2011-03-20 DIAGNOSIS — D126 Benign neoplasm of colon, unspecified: Secondary | ICD-10-CM

## 2011-03-20 DIAGNOSIS — Z860100 Personal history of colon polyps, unspecified: Secondary | ICD-10-CM

## 2011-03-20 DIAGNOSIS — Z1211 Encounter for screening for malignant neoplasm of colon: Secondary | ICD-10-CM

## 2011-03-20 DIAGNOSIS — Z800 Family history of malignant neoplasm of digestive organs: Secondary | ICD-10-CM

## 2011-03-20 MED ORDER — DEXTROSE 5 % IV SOLN: INTRAVENOUS | Status: DC

## 2011-03-20 NOTE — Op Note (Signed)
[facility] [address] [place], [place]  [address]  COLONOSCOPY PROCEDURE REPORT  PATIENT:  Dylan Orozco, Dylan Orozco  MR#:  [id number] BIRTHDATE:  1933/09/07, 77 yrs. old  GENDER:  male ENDOSCOPIST:  Judie Petit T. Russella Dar, MD, [facility]  PROCEDURE DATE:  03/20/2011 PROCEDURE:  Colonoscopy with snare polypectomy ASA CLASS:  Class II INDICATIONS:  1) surveillance and high-risk screening  2) family history of colon cancer: sister at 75  3) history of pre-cancerous (adenomatous) colon polyps: 2007 MEDICATIONS:   These medications were titrated to patient response per physician's verbal order, Fentanyl 75 mcg IV, Versed 7 mg IV DESCRIPTION OF PROCEDURE:   After the risks benefits and alternatives of the procedure were thoroughly explained, informed consent was obtained.  Digital rectal exam was performed and revealed no abnormalities.   The LB [id number]-[id number] endoscope was introduced through the anus and advanced to the cecum, which was identified by both the appendix and ileocecal valve, without limitations.  The quality of the prep was adequate, using MoviPrep.  The instrument was then slowly withdrawn as the colon was fully examined. <<PROCEDUREIMAGES>> FINDINGS:  A sessile polyp was found in the mid transverse colon. It was 6 mm in size. Polyp was snared without cautery. Retrieval was successful.  A sessile polyp was found in the descending colon. It was 5 mm in size. Polyp was snared without cautery. Retrieval was successful.  Otherwise normal colonoscopy without other polyps, masses, vascular ectasias, or inflammatory changes. Retroflexed views in the rectum revealed no abnormalities.    The time to cecum =  4.5  minutes. The scope was then withdrawn (time =  13  min) from the patient and the procedure completed.  COMPLICATIONS:  None  ENDOSCOPIC IMPRESSION: 1) 6 mm sessile polyp in the mid transverse colon 2) 5 mm sessile polyp in the descending colon  RECOMMENDATIONS: 1)  Await pathology results 2) Repeat Colonoscopy in 5 years with a more extensive bowel prep  Myrtie Leuthold T. Russella Dar, MD, [facility]  n. eSIGNED:   Venita Lick. Karthik Whittinghill at 03/20/2011 09:40 AM  Hillary Bow, [phone]

## 2011-03-20 NOTE — Patient Instructions (Signed)
YOU HAD AN ENDOSCOPIC PROCEDURE TODAY AT THE [facility]: Refer to the procedure report that was given to you for any specific questions about what was found during the examination.  If the procedure report does not answer your questions, please call your gastroenterologist to clarify.  If you requested that your care partner not be given the details of your procedure findings, then the procedure report has been included in a sealed envelope for you to review at your convenience later.  YOU SHOULD EXPECT: Some feelings of bloating in the abdomen. Passage of more gas than usual.  Walking can help get rid of the air that was put into your GI tract during the procedure and reduce the bloating. If you had a lower endoscopy (such as a colonoscopy or flexible sigmoidoscopy) you may notice spotting of blood in your stool or on the toilet paper. If you underwent a bowel prep for your procedure, then you may not have a normal bowel movement for a few days.  DIET: Your first meal following the procedure should be a light meal and then it is ok to progress to your normal diet.  A half-sandwich or bowl of soup is an example of a good first meal.  Heavy or fried foods are harder to digest and may make you feel nauseous or bloated.  Likewise meals heavy in dairy and vegetables can cause extra gas to form and this can also increase the bloating.  Drink plenty of fluids but you should avoid alcoholic beverages for 24 hours.  ACTIVITY: Your care partner should take you home directly after the procedure.  You should plan to take it easy, moving slowly for the rest of the day.  You can resume normal activity the day after the procedure however you should NOT DRIVE or use heavy machinery for 24 hours (because of the sedation medicines used during the test).    SYMPTOMS TO REPORT IMMEDIATELY: A gastroenterologist can be reached at any hour.  During normal business hours, 8:30 AM to 5:00 PM Monday through Friday,  call [phone].  After hours and on weekends, please call the GI answering service at [phone] who will take a message and have the physician on call contact you.   Following lower endoscopy (colonoscopy or flexible sigmoidoscopy):  Excessive amounts of blood in the stool  Significant tenderness or worsening of abdominal pains  Swelling of the abdomen that is new, acute  Fever of 100F or higher  Following upper endoscopy (EGD)  Vomiting of blood or coffee ground material  New chest pain or pain under the shoulder blades  Painful or persistently difficult swallowing  New shortness of breath  Fever of 100F or higher  Black, tarry-looking stools  FOLLOW UP: If any biopsies were taken you will be contacted by phone or by letter within the next 1-3 weeks.  Call your gastroenterologist if you have not heard about the biopsies in 3 weeks.  Our staff will call the home number listed on your records the next business day following your procedure to check on you and address any questions or concerns that you may have at that time regarding the information given to you following your procedure. This is a courtesy call and so if there is no answer at the home number and we have not heard from you through the emergency physician on call, we will assume that you have returned to your regular daily activities without incident.  SIGNATURES/CONFIDENTIALITY: You and/or your care   partner have signed paperwork which will be entered into your electronic medical record.  These signatures attest to the fact that that the information above on your After Visit Summary has been reviewed and is understood.  Full responsibility of the confidentiality of this discharge information lies with you and/or your care-partner.   Teaching materials Given:  Polyps  Please follow all discharge instructions given to you by the recovery room nurse. If you have any questions or problems after discharge please call us  at one of the numbers above. You will receive a phone call in the am to see how you are doing and answer any questions you may have. Thank you for choosing [facility] for your health care needs.

## 2011-03-20 NOTE — Progress Notes (Signed)
Patient did not experience any of the following events: a burn prior to discharge; a fall within the facility; wrong site/side/patient/procedure/implant event; or a hospital transfer or hospital admission upon discharge from the facility. (G8907) Patient did not have preoperative order for IV antibiotic SSI prophylaxis. (G8918)  

## 2011-03-21 ENCOUNTER — Telehealth: Payer: Self-pay | Admitting: *Deleted

## 2011-03-21 NOTE — Telephone Encounter (Signed)
  Follow up Call-  Call back number 03/20/2011  Post procedure Call Back phone  # [phone]  Permission to leave phone message Yes     Patient questions:  Do you have a fever, pain , or abdominal swelling? no Pain Score  0 *  Have you tolerated food without any problems? yes  Have you been able to return to your normal activities? yes  Do you have any questions about your discharge instructions: Diet   no Medications  no Follow up visit  no  Do you have questions or concerns about your Care? no  Actions: * If pain score is 4 or above: No action needed, pain <4.

## 2011-03-22 ENCOUNTER — Telehealth: Payer: Self-pay | Admitting: Internal Medicine

## 2011-03-22 NOTE — Telephone Encounter (Signed)
Patient had rectal pain last night "in the middle of the night".  He reports did not last long.  Denies any other GI complaints.  He is having BMs, eating well, denies diarrhea, constipation, rectal bleeding, or abdominal pain.  He is advised can try sitz bath if rectal pain returns.  He is asked to call back for any questions or continued problems.

## 2011-03-22 NOTE — Telephone Encounter (Signed)
I agree

## 2011-03-26 ENCOUNTER — Encounter: Payer: Self-pay | Admitting: Gastroenterology

## 2011-04-16 ENCOUNTER — Other Ambulatory Visit (INDEPENDENT_AMBULATORY_CARE_PROVIDER_SITE_OTHER): Payer: Medicare Other

## 2011-04-16 DIAGNOSIS — E785 Hyperlipidemia, unspecified: Secondary | ICD-10-CM

## 2011-04-16 DIAGNOSIS — E119 Type 2 diabetes mellitus without complications: Secondary | ICD-10-CM

## 2011-04-16 DIAGNOSIS — T887XXA Unspecified adverse effect of drug or medicament, initial encounter: Secondary | ICD-10-CM

## 2011-04-16 DIAGNOSIS — Z79899 Other long term (current) drug therapy: Secondary | ICD-10-CM

## 2011-04-16 LAB — BASIC METABOLIC PANEL
CO2: 27 mEq/L (ref 19–32)
Calcium: 9.3 mg/dL (ref 8.4–10.5)
Glucose, Bld: 117 mg/dL — ABNORMAL HIGH (ref 70–99)
Potassium: 4.2 mEq/L (ref 3.5–5.1)
Sodium: 141 mEq/L (ref 135–145)

## 2011-04-16 LAB — CK: Total CK: 55 U/L (ref 7–232)

## 2011-04-16 LAB — LIPID PANEL
HDL: 65.5 mg/dL (ref >39.00)
Total CHOL/HDL Ratio: 3
VLDL: 12 mg/dL (ref 0.0–40.0)

## 2011-04-16 LAB — HEMOGLOBIN A1C: Hgb A1c MFr Bld: 6.8 % — ABNORMAL HIGH (ref 4.6–6.5)

## 2011-04-16 LAB — HEPATIC FUNCTION PANEL: Total Bilirubin: 0.3 mg/dL (ref 0.3–1.2)

## 2011-05-12 ENCOUNTER — Other Ambulatory Visit: Payer: Self-pay | Admitting: Internal Medicine

## 2011-06-05 ENCOUNTER — Other Ambulatory Visit: Payer: Self-pay | Admitting: Internal Medicine

## 2011-06-06 ENCOUNTER — Other Ambulatory Visit: Payer: Self-pay | Admitting: Internal Medicine

## 2011-07-04 ENCOUNTER — Encounter: Payer: Self-pay | Admitting: Internal Medicine

## 2011-07-04 ENCOUNTER — Ambulatory Visit (INDEPENDENT_AMBULATORY_CARE_PROVIDER_SITE_OTHER): Payer: Medicare Other | Admitting: Internal Medicine

## 2011-07-04 ENCOUNTER — Telehealth: Payer: Self-pay | Admitting: Internal Medicine

## 2011-07-04 VITALS — BP 108/66 | HR 65 | Temp 97.5°F | Resp 14 | Ht 68.75 in | Wt 161.5 lb

## 2011-07-04 DIAGNOSIS — R42 Dizziness and giddiness: Secondary | ICD-10-CM

## 2011-07-04 DIAGNOSIS — E119 Type 2 diabetes mellitus without complications: Secondary | ICD-10-CM

## 2011-07-04 DIAGNOSIS — E782 Mixed hyperlipidemia: Secondary | ICD-10-CM

## 2011-07-04 DIAGNOSIS — R1310 Dysphagia, unspecified: Secondary | ICD-10-CM

## 2011-07-04 DIAGNOSIS — K5900 Constipation, unspecified: Secondary | ICD-10-CM

## 2011-07-04 DIAGNOSIS — I959 Hypotension, unspecified: Secondary | ICD-10-CM

## 2011-07-04 LAB — CBC WITH DIFFERENTIAL/PLATELET
Basophils Relative: 1.1 % (ref 0.0–3.0)
Eosinophils Absolute: 0.1 10*3/uL (ref 0.0–0.7)
Lymphs Abs: 0.7 10*3/uL (ref 0.7–4.0)
MCHC: 32.2 g/dL (ref 30.0–36.0)
MCV: 91.1 fl (ref 78.0–100.0)
Monocytes Absolute: 0.3 10*3/uL (ref 0.1–1.0)
Neutrophils Relative %: 61.3 % (ref 43.0–77.0)
Platelets: 112 10*3/uL — ABNORMAL LOW (ref 150.0–400.0)

## 2011-07-04 LAB — BASIC METABOLIC PANEL
BUN: 16 mg/dL (ref 6–23)
Calcium: 9.1 mg/dL (ref 8.4–10.5)
GFR: 86.36 mL/min (ref >60.00)
Potassium: 4.1 mEq/L (ref 3.5–5.1)
Sodium: 141 mEq/L (ref 135–145)

## 2011-07-04 LAB — HEMOGLOBIN A1C: Hgb A1c MFr Bld: 6.7 % — ABNORMAL HIGH (ref 4.6–6.5)

## 2011-07-04 MED ORDER — RANITIDINE HCL 150 MG PO TABS: 150.0000 mg | ORAL_TABLET | Freq: Two times a day (BID) | ORAL | Status: DC

## 2011-07-04 NOTE — Assessment & Plan Note (Signed)
Ramipril was stopped because of dizziness. His creatinine was 1.0 and GFR 96.77 in March of this year. His blood pressure remains low of the ACE inhibitor.

## 2011-07-04 NOTE — Telephone Encounter (Signed)
Went over AVS with patient prior to sending to lab; patient has already left office; in the future, we would need to know information as this before patient is told Ok to leave. Thanks/SLS

## 2011-07-04 NOTE — Assessment & Plan Note (Addendum)
He stopped  pravastatin because of constipation. On the medication his lipids were at goal. Specifically his LDL was 89. Prior to the pravastatin it was 144.

## 2011-07-04 NOTE — Telephone Encounter (Signed)
Patient is in lab and wants to know if Dr.Hopper intends to take him off of the Ramipril all together, please review and advise.  Thanks

## 2011-07-04 NOTE — Patient Instructions (Addendum)
Miralax every 3 rd day as needed for constipation. Please try to go on My Chart within the next 24 hours to allow me to release the results directly to you.

## 2011-07-04 NOTE — Progress Notes (Signed)
Subjective:    Patient ID: Dylan Orozco, male    DOB: 1933-06-14, 78 y.o.   MRN: [id number]  HPI He describes constipation for the past 3 weeks unresponsive to Metamucil and only partially responsive to fleets enema. He also has had anorexia with decreased appetite for several weeks with an associated weight loss of 7 #. He stopped his pravastatin questioning that it was causing constipation. He also stopped his ramipril because of dizziness. He is unsure whether the ramipril  or one of his glaucoma eyedrops caused dizziness  Past medical history/family history/social history were all reviewed and updated. Adenomatous polyp April 2013    Review of Systems HYPERTENSION: Disease Monitoring: Blood pressure -122/79 average Chest pain, palpitations- no       Dyspnea- no Medications: Compliance- see above ; dizziness better off ACE-I  Syncope- no    Edema- minor  DIABETES: Disease Monitoring: Blood Sugar-  FBS not checked , checked when dizzy  Polyuria/phagia/dipsia- no       Visual problems- blurred Medications: Compliance- yes  Hypoglycemic symptoms- random glucose 65 @ 11:15 am fasting  HYPERLIPIDEMIA: Disease Monitoring: See symptoms for Hypertension Medications: Compliance- see above Abd pain, bowel changes- no melena , rectal bleeding, diarrhea . He denies significant dyspepsia but does have pill dysphagia intermittently  Muscle aches- no  Nausea/Vomiting:   Hematemesis:no Fever/Chills:no Dysuria/ hematuria/pyuria: no         Objective:   Physical Exam Gen.: Thin but healthy and well-nourished in appearance. Alert, appropriate and cooperative throughout exam. Head: Normocephalic without obvious abnormalities;  Head shaven Eyes: No corneal or conjunctival inflammation noted. Pupils equal round reactive to light and accommodation. Fundal exam is benign without hemorrhages, exudate, papilledema. Extraocular motion intact. Anisocoria, right pupil larger than the  left. Ptosis on the right. Unsustained lateral nystagmus. Ears: External  ear exam reveals no significant lesions or deformities. Canals with wax bilaterally . Nose: External nasal exam reveals no deformity or inflammation. Nasal mucosa are pink and moist. No lesions or exudates noted.  Mouth: Oral mucosa and oropharynx reveal no lesions or exudates. Teeth in good repair. Neck: No deformities, masses, or tenderness noted. Range of motion slightly reduced bilaterally.  Lungs: Normal respiratory effort; chest expands symmetrically. Lungs are clear to auscultation without rales, wheezes, or increased work of breathing. Heart: Normal rate and rhythm. Normal S1 and S2. No gallop, click, or rub. S4 w/o murmur. Abdomen: Bowel sounds normal; abdomen soft and nontender. No masses, organomegaly or hernias noted.                                                                                 Musculoskeletal/extremities:  No  cyanosis, edema, or deformity noted. Clubbing suggested. Tone & strength  normal.Joints normal. Nail health  good. Vascular: Carotid, radial artery, dorsalis pedis and  posterior tibial pulses are full and equal. No bruits present. Neurologic: Alert and oriented x3. Deep tendon reflexes symmetrical and 0-1/2 + @ knees. Gait is slow and slightly broad-based. Romberg testing and finger to nose testing normal         Skin: Intact without suspicious lesions or rashes. Lymph: No cervical, axillary lymphadenopathy present. Psych: Mood and affect are  normal. Normally interactive                                                                                       Assessment & Plan:  #1 constipation; I doubt this is from the statin. MiraLax every third day would be recommended.  #2 dizziness in the context of relative hypotension of ACE inhibitor.  #3 anorexia  #4 pill dysphagia  #5 see problem update & assessments  Plan: See orders and recommendations

## 2011-09-06 ENCOUNTER — Telehealth: Payer: Self-pay | Admitting: Internal Medicine

## 2011-09-06 DIAGNOSIS — H409 Unspecified glaucoma: Secondary | ICD-10-CM

## 2011-09-06 NOTE — Telephone Encounter (Signed)
Pt stated he has been seeing a specialist for a while in [place] for Glaucoma but wanted to go somewhere closer, so that specialist referred him to another Glaucoma specialist in [place] BUT the [place] Specialist is requiring a referral from patients Primary Care Provider  Patient has appt on 8.26.13 @ 930am Dr. Chalmers Guest of [facility]  CB# dor pt [phone]

## 2011-09-07 NOTE — Telephone Encounter (Signed)
Pt aware.

## 2011-09-07 NOTE — Telephone Encounter (Signed)
Referral OK

## 2011-11-07 ENCOUNTER — Ambulatory Visit (INDEPENDENT_AMBULATORY_CARE_PROVIDER_SITE_OTHER): Payer: Medicare Other | Admitting: *Deleted

## 2011-11-07 DIAGNOSIS — Z23 Encounter for immunization: Secondary | ICD-10-CM

## 2011-11-20 ENCOUNTER — Encounter (HOSPITAL_COMMUNITY): Payer: Self-pay | Admitting: Pharmacy Technician

## 2011-11-26 ENCOUNTER — Ambulatory Visit (HOSPITAL_BASED_OUTPATIENT_CLINIC_OR_DEPARTMENT_OTHER): Admit: 2011-11-26 | Payer: Self-pay | Admitting: Ophthalmology

## 2011-11-26 ENCOUNTER — Encounter (HOSPITAL_BASED_OUTPATIENT_CLINIC_OR_DEPARTMENT_OTHER): Payer: Self-pay

## 2011-11-26 SURGERY — INSERTION OF MINI SHUNT
Anesthesia: Monitor Anesthesia Care | Site: Eye | Laterality: Left

## 2011-11-27 ENCOUNTER — Other Ambulatory Visit: Payer: Self-pay | Admitting: Ophthalmology

## 2011-11-27 MED ORDER — TETRACAINE HCL 0.5 % OP SOLN: 1.0000 [drp] | OPHTHALMIC | Status: DC

## 2011-11-28 ENCOUNTER — Encounter (HOSPITAL_COMMUNITY)
Admission: RE | Admit: 2011-11-28 | Discharge: 2011-11-28 | Disposition: A | Discharge status: Home / Self Care | Payer: Medicare Other | Source: Ambulatory Visit | Attending: Ophthalmology | Admitting: Ophthalmology

## 2011-11-28 ENCOUNTER — Encounter (HOSPITAL_COMMUNITY): Payer: Self-pay

## 2011-11-28 HISTORY — DX: Personal history of other medical treatment: Z92.89

## 2011-11-28 HISTORY — DX: Unspecified osteoarthritis, unspecified site: M19.90

## 2011-11-28 HISTORY — DX: Constipation, unspecified: K59.00

## 2011-11-28 HISTORY — DX: Gastro-esophageal reflux disease without esophagitis: K21.9

## 2011-11-28 LAB — BASIC METABOLIC PANEL
BUN: 20 mg/dL (ref 6–23)
CO2: 18 mEq/L — ABNORMAL LOW (ref 19–32)
Chloride: 111 mEq/L (ref 96–112)
Glucose, Bld: 190 mg/dL — ABNORMAL HIGH (ref 70–99)
Potassium: 4 mEq/L (ref 3.5–5.1)

## 2011-11-28 LAB — CBC
HCT: 42 % (ref 39.0–52.0)
Hemoglobin: 13.5 g/dL (ref 13.0–17.0)
MCHC: 32.1 g/dL (ref 30.0–36.0)
WBC: 3.5 10*3/uL — ABNORMAL LOW (ref 4.0–10.5)

## 2011-11-28 LAB — SURGICAL PCR SCREEN: Staphylococcus aureus: NEGATIVE

## 2011-11-28 NOTE — Pre-Procedure Instructions (Signed)
20 STU MINOTTI  11/28/2011   Your procedure is scheduled on:  Monday December 03, 2011  Report to [facility] at 8:15 AM.  Call this number if you have problems the morning of surgery: [phone]   Remember:   Do not eat food or drink :After Midnight.      Take these medicines the morning of surgery with A SIP OF WATER: zantac,    Do not wear jewelry, make-up or nail polish.  Do not wear lotions, powders, or perfumes.  Do not shave 48 hours prior to surgery. Men may shave face and neck.  Do not bring valuables to the hospital.  Contacts, dentures or bridgework may not be worn into surgery.  Leave suitcase in the car. After surgery it may be brought to your room.  For patients admitted to the hospital, checkout time is 11:00 AM the day of discharge.   Patients discharged the day of surgery will not be allowed to drive home.  Name and phone number of your driver: family / friend  Special Instructions: Shower using CHG 2 nights before surgery and the night before surgery.  If you shower the day of surgery use CHG.  Use special wash - you have one bottle of CHG for all showers.  You should use approximately 1/3 of the bottle for each shower.   Please read over the following fact sheets that you were given: Pain Booklet, Coughing and Deep Breathing, MRSA Information and Surgical Site Infection Prevention

## 2011-11-28 NOTE — Pre-Procedure Instructions (Signed)
20 Dylan Orozco  11/28/2011   Your procedure is scheduled on:  Monday December 03, 2011  Report to [facility] at 8:15 AM.  Call this number if you have problems the morning of surgery: [phone]   Remember:   Do not eat food or drink :After Midnight.      Take these medicines the morning of surgery with A SIP OF WATER: Zantac.   Do not wear jewelry, make-up or nail polish.  Do not wear lotions, powders, or perfumes.  Do not shave 48 hours prior to surgery. Men may shave face and neck.  Do not bring valuables to the hospital.  Contacts, dentures or bridgework may not be worn into surgery.  Leave suitcase in the car. After surgery it may be brought to your room.  For patients admitted to the hospital, checkout time is 11:00 AM the day of discharge.   Patients discharged the day of surgery will not be allowed to drive home.  Name and phone number of your driver: family / friend  Special Instructions: Shower using CHG 2 nights before surgery and the night before surgery.  If you shower the day of surgery use CHG.  Use special wash - you have one bottle of CHG for all showers.  You should use approximately 1/3 of the bottle for each shower.   Please read over the following fact sheets that you were given: Pain Booklet, Coughing and Deep Breathing, MRSA Information and Surgical Site Infection Prevention

## 2011-12-02 MED ORDER — GATIFLOXACIN 0.5 % OP SOLN
1.0000 [drp] | OPHTHALMIC | Status: AC
  Administered 2011-12-03 (×3): 1 [drp] via OPHTHALMIC

## 2011-12-03 ENCOUNTER — Ambulatory Visit (HOSPITAL_COMMUNITY): Payer: Medicare Other | Admitting: Anesthesiology

## 2011-12-03 ENCOUNTER — Ambulatory Visit (HOSPITAL_COMMUNITY)
Admission: RE | Admit: 2011-12-03 | Discharge: 2011-12-03 | Disposition: A | Discharge status: Home / Self Care | Payer: Medicare Other | Source: Ambulatory Visit | Attending: Ophthalmology | Admitting: Ophthalmology

## 2011-12-03 ENCOUNTER — Encounter (HOSPITAL_COMMUNITY)
Admission: RE | Disposition: A | Discharge status: Home / Self Care | Payer: Self-pay | Source: Ambulatory Visit | Attending: Ophthalmology

## 2011-12-03 ENCOUNTER — Encounter (HOSPITAL_COMMUNITY): Payer: Self-pay | Admitting: Anesthesiology

## 2011-12-03 ENCOUNTER — Other Ambulatory Visit: Payer: Self-pay | Admitting: Ophthalmology

## 2011-12-03 ENCOUNTER — Encounter (HOSPITAL_COMMUNITY): Payer: Self-pay | Admitting: *Deleted

## 2011-12-03 ENCOUNTER — Encounter (HOSPITAL_COMMUNITY): Payer: Self-pay | Admitting: Certified Registered Nurse Anesthetist

## 2011-12-03 DIAGNOSIS — H2510 Age-related nuclear cataract, unspecified eye: Secondary | ICD-10-CM | POA: Insufficient documentation

## 2011-12-03 DIAGNOSIS — H4011X0 Primary open-angle glaucoma, stage unspecified: Secondary | ICD-10-CM | POA: Insufficient documentation

## 2011-12-03 DIAGNOSIS — M129 Arthropathy, unspecified: Secondary | ICD-10-CM | POA: Insufficient documentation

## 2011-12-03 DIAGNOSIS — I10 Essential (primary) hypertension: Secondary | ICD-10-CM | POA: Insufficient documentation

## 2011-12-03 DIAGNOSIS — H409 Unspecified glaucoma: Secondary | ICD-10-CM | POA: Insufficient documentation

## 2011-12-03 DIAGNOSIS — E119 Type 2 diabetes mellitus without complications: Secondary | ICD-10-CM | POA: Insufficient documentation

## 2011-12-03 DIAGNOSIS — K219 Gastro-esophageal reflux disease without esophagitis: Secondary | ICD-10-CM | POA: Insufficient documentation

## 2011-12-03 DIAGNOSIS — Z01812 Encounter for preprocedural laboratory examination: Secondary | ICD-10-CM | POA: Insufficient documentation

## 2011-12-03 HISTORY — PX: MINI SHUNT INSERTION: SHX5337

## 2011-12-03 LAB — GLUCOSE, CAPILLARY: Glucose-Capillary: 153 mg/dL — ABNORMAL HIGH (ref 70–99)

## 2011-12-03 SURGERY — INSERTION OF MINI SHUNT
Anesthesia: Monitor Anesthesia Care | Site: Eye | Laterality: Left | Wound class: Clean

## 2011-12-03 MED ORDER — HYALURONIDASE OVINE 200 UNIT/ML IJ SOLN
INTRAMUSCULAR | Status: DC | PRN
  Administered 2011-12-03: 150 [IU] via SUBCUTANEOUS

## 2011-12-03 MED ORDER — LACTATED RINGERS IV SOLN: INTRAVENOUS | Status: DC

## 2011-12-03 MED ORDER — LIDOCAINE HCL 2 % IJ SOLN
INTRAMUSCULAR | Status: AC
  Filled 2011-12-03: qty 20

## 2011-12-03 MED ORDER — TETRACAINE HCL 0.5 % OP SOLN
OPHTHALMIC | Status: AC
  Filled 2011-12-03: qty 2

## 2011-12-03 MED ORDER — FENTANYL CITRATE 0.05 MG/ML IJ SOLN: 25.0000 ug | INTRAMUSCULAR | Status: DC | PRN

## 2011-12-03 MED ORDER — PROPOFOL 10 MG/ML IV BOLUS
INTRAVENOUS | Status: DC | PRN
  Administered 2011-12-03: 50 mg via INTRAVENOUS
  Administered 2011-12-03: 20 mg via INTRAVENOUS

## 2011-12-03 MED ORDER — HYDROCODONE-ACETAMINOPHEN 5-500 MG PO TABS: 1.0000 | ORAL_TABLET | Freq: Four times a day (QID) | ORAL | Status: DC | PRN

## 2011-12-03 MED ORDER — SODIUM CHLORIDE 0.9 % IV SOLN
INTRAVENOUS | Status: DC | PRN
  Administered 2011-12-03: 10:00:00 via INTRAVENOUS

## 2011-12-03 MED ORDER — GATIFLOXACIN 0.5 % OP SOLN
OPHTHALMIC | Status: AC
  Administered 2011-12-03: 1 [drp] via OPHTHALMIC
  Filled 2011-12-03: qty 2.5

## 2011-12-03 MED ORDER — SODIUM CHLORIDE 0.9 % IV SOLN
INTRAVENOUS | Status: DC
  Administered 2011-12-03: 10:00:00 via INTRAVENOUS

## 2011-12-03 MED ORDER — PROVISC 10 MG/ML IO SOLN
INTRAOCULAR | Status: DC | PRN
  Administered 2011-12-03: .85 mL via INTRAOCULAR

## 2011-12-03 MED ORDER — BUPIVACAINE HCL (PF) 0.75 % IJ SOLN
INTRAMUSCULAR | Status: DC | PRN
  Administered 2011-12-03: 10 mL

## 2011-12-03 MED ORDER — FLUORESCEIN SODIUM 1 MG OP STRP
ORAL_STRIP | OPHTHALMIC | Status: DC | PRN
  Administered 2011-12-03: 1 via OPHTHALMIC

## 2011-12-03 MED ORDER — EPINEPHRINE HCL 1 MG/ML IJ SOLN
INTRAMUSCULAR | Status: AC
  Filled 2011-12-03: qty 1

## 2011-12-03 MED ORDER — BSS IO SOLN
INTRAOCULAR | Status: DC | PRN
  Administered 2011-12-03: 80 mL via INTRAOCULAR

## 2011-12-03 MED ORDER — MITOMYCIN-C INJECTION USE IN OR ONLY (0.4 MG/ML)
1.0000 mL | INTRAVENOUS | Status: AC
  Administered 2011-12-03: 0.5 mL via OPHTHALMIC
  Filled 2011-12-03: qty 1

## 2011-12-03 MED ORDER — TRIAMCINOLONE ACETONIDE 40 MG/ML IJ SUSP
INTRAMUSCULAR | Status: AC
  Filled 2011-12-03: qty 1

## 2011-12-03 MED ORDER — ONDANSETRON HCL 4 MG/2ML IJ SOLN: 4.0000 mg | Freq: Four times a day (QID) | INTRAMUSCULAR | Status: DC | PRN

## 2011-12-03 MED ORDER — TRIAMCINOLONE ACETONIDE 40 MG/ML IJ SUSP
INTRAMUSCULAR | Status: DC | PRN
  Administered 2011-12-03: .1 mL

## 2011-12-03 MED ORDER — ACETYLCHOLINE CHLORIDE 1:100 IO SOLR
INTRAOCULAR | Status: AC
  Filled 2011-12-03: qty 1

## 2011-12-03 MED ORDER — LIDOCAINE-EPINEPHRINE 2 %-1:100000 IJ SOLN
INTRAMUSCULAR | Status: AC
  Filled 2011-12-03: qty 1

## 2011-12-03 MED ORDER — ATROPINE SULFATE 1 % OP SOLN
OPHTHALMIC | Status: AC
  Filled 2011-12-03: qty 2

## 2011-12-03 MED ORDER — OXYCODONE HCL 5 MG/5ML PO SOLN: 5.0000 mg | Freq: Once | ORAL | Status: DC | PRN

## 2011-12-03 MED ORDER — LIDOCAINE-EPINEPHRINE 2 %-1:100000 IJ SOLN
INTRAMUSCULAR | Status: DC | PRN
  Administered 2011-12-03: 10 mL

## 2011-12-03 MED ORDER — OXYCODONE HCL 5 MG PO TABS: 5.0000 mg | ORAL_TABLET | Freq: Once | ORAL | Status: DC | PRN

## 2011-12-03 MED ORDER — BSS IO SOLN
INTRAOCULAR | Status: AC
  Filled 2011-12-03: qty 500

## 2011-12-03 MED ORDER — TOBRAMYCIN-DEXAMETHASONE 0.3-0.1 % OP OINT
TOPICAL_OINTMENT | OPHTHALMIC | Status: AC
  Filled 2011-12-03: qty 3.5

## 2011-12-03 MED ORDER — HEMOSTATIC AGENTS (NO CHARGE) OPTIME
TOPICAL | Status: DC | PRN
  Administered 2011-12-03: 1 via TOPICAL

## 2011-12-03 MED ORDER — TOBRAMYCIN 0.3 % OP OINT
TOPICAL_OINTMENT | OPHTHALMIC | Status: DC | PRN
  Administered 2011-12-03: 1 via OPHTHALMIC

## 2011-12-03 MED ORDER — BSS IO SOLN
INTRAOCULAR | Status: DC | PRN
  Administered 2011-12-03: 15 mL via INTRAOCULAR

## 2011-12-03 SURGICAL SUPPLY — 44 items
APPLICATOR COTTON TIP 6IN STRL (MISCELLANEOUS) ×2 IMPLANT
APPLICATOR DR MATTHEWS STRL (MISCELLANEOUS) IMPLANT
BLADE EYE CATARACT 19 1.4 BEAV (BLADE) ×2 IMPLANT
BLADE STAB KNIFE 45DEG (BLADE) ×2 IMPLANT
BLADE SURG 15 STRL LF DISP TIS (BLADE) IMPLANT
BLADE SURG 15 STRL SS (BLADE)
CANISTER SUCTION 2500CC (MISCELLANEOUS) IMPLANT
CLOTH BEACON ORANGE TIMEOUT ST (SAFETY) ×2 IMPLANT
CORDS BIPOLAR (ELECTRODE) ×2 IMPLANT
DRAPE OPHTHALMIC 40X48 W POUCH (DRAPES) ×2 IMPLANT
DRAPE RETRACTOR (MISCELLANEOUS) ×2 IMPLANT
ERASER HMR WETFIELD 23G BP (MISCELLANEOUS) ×2 IMPLANT
GLOVE BIO SURGEON STRL SZ8 (GLOVE) ×2 IMPLANT
GLOVE BIO SURGEON STRL SZ8.5 (GLOVE) ×2 IMPLANT
GLOVE ECLIPSE 7.0 STRL STRAW (GLOVE) ×2 IMPLANT
GOWN PREVENTION PLUS XLARGE (GOWN DISPOSABLE) ×2 IMPLANT
GOWN STRL NON-REIN LRG LVL3 (GOWN DISPOSABLE) ×2 IMPLANT
KIT ROOM TURNOVER OR (KITS) ×2 IMPLANT
KNIFE GRIESHABER SHARP 2.5MM (MISCELLANEOUS) ×2 IMPLANT
MARKER SKIN DUAL TIP RULER LAB (MISCELLANEOUS) IMPLANT
NEEDLE 22X1 1/2 (OR ONLY) (NEEDLE) IMPLANT
NEEDLE 25GX 5/8IN NON SAFETY (NEEDLE) ×2 IMPLANT
NEEDLE HYPO 23GX1 LL BLUE HUB (NEEDLE) IMPLANT
NEEDLE HYPO 30X.5 LL (NEEDLE) IMPLANT
NS IRRIG 1000ML POUR BTL (IV SOLUTION) ×2 IMPLANT
PACK CATARACT CUSTOM (CUSTOM PROCEDURE TRAY) ×2 IMPLANT
PAD ARMBOARD 7.5X6 YLW CONV (MISCELLANEOUS) ×4 IMPLANT
SHUNT EXPRS GLAUCOMA MINI P200 (Intraocular Lens) ×2 IMPLANT
SPEAR EYE SURG WECK-CEL (MISCELLANEOUS) IMPLANT
SPECIMEN JAR SMALL (MISCELLANEOUS) IMPLANT
SPONGE SURGIFOAM ABS GEL 12-7 (HEMOSTASIS) ×2 IMPLANT
SUT ETHILON 10 0 CS140 6 (SUTURE) IMPLANT
SUT ETHILON 9 0 BV100 4 (SUTURE) ×2 IMPLANT
SUT ETHILON 9 0 TG140 8 (SUTURE) IMPLANT
SUT MERSILENE 6 0 S14 DA (SUTURE) IMPLANT
SUT SILK 6 0 G 6 (SUTURE) ×2 IMPLANT
SUT VICRYL 8 0 TG140 8 (SUTURE) ×2 IMPLANT
SUT VICRYL 9-0 (SUTURE) IMPLANT
SYR 50ML SLIP (SYRINGE) ×2 IMPLANT
SYR TB 1ML LUER SLIP (SYRINGE) IMPLANT
TOWEL OR 17X24 6PK STRL BLUE (TOWEL DISPOSABLE) ×4 IMPLANT
TUBE CONNECTING 12X1/4 (SUCTIONS) ×2 IMPLANT
WATER STERILE IRR 1000ML POUR (IV SOLUTION) ×2 IMPLANT
WIPE INSTRUMENT VISIWIPE 73X73 (MISCELLANEOUS) ×2 IMPLANT

## 2011-12-03 NOTE — H&P (Signed)
  History and physical  Chief complaint: Very blurred vision left eye with uncontrolled elevated intraocular pressure.  Review of systems: Negative  Exam Visual acuity OD 20/50, OS hand motion  Motility: Full versions Pupils: 4 mm normal reaction +4 Marcus Gunn pupil left eye  Slit-lamp examination Conjunctiva: Quiet each eye +1 injection OS  Cornea arcus each eye with mild microcystic edema OS  anterior chamber: Deep and quiet each eye  Lens 1-2 nuclear sclerosis OD, 2+ nuclear sclerosis OS  Intraocular pressure OD 20 mmHg, OS 44 mmHg  Fundus optic nerve OD 45-50% cupping macula flat periphery normal, OS temporal round loss very thin remaining nasal rim macula clear periphery normal  HEENT: Neck supple throat clear  Lungs: clear to os the patient and percussion  Heart: Regular in rate no murmurs  Abdomen: Normal bowel sounds soft  Extremities: Normal Skin normal Neurologic: nerves II through XII intact  Assessment: Uncontrolled primary open-angle glaucoma with severe vision loss. The risks and benefits of glaucoma surgery have been explained to the patient as well as a very poor prognosis of any visual recovery in chart the pressure remained very high with difficulty taken all the medications plan: Recommend express mini tube with mitomycin-C left eye to control the glaucoma.  Rory Samauri Kellenberger Junior M.D.

## 2011-12-03 NOTE — Transfer of Care (Signed)
Immediate Anesthesia Transfer of Care Note  Patient: Dylan Orozco  Procedure(s) Performed: Procedure(s) (LRB) with comments: INSERTION OF MINI SHUNT (Left) - trabeculectomy with insertion of mini shunt and mitomycin C.  Patient Location: Short Stay  Anesthesia Type:MAC  Level of Consciousness: awake, alert , oriented and patient cooperative  Airway & Oxygen Therapy: Patient Spontanous Breathing  Post-op Assessment: Report given to PACU RN  Post vital signs: Reviewed and stable  Complications: No apparent anesthesia complications

## 2011-12-03 NOTE — Preoperative (Signed)
Beta Blockers   Reason not to administer Beta Blockers:Not Applicable 

## 2011-12-03 NOTE — Progress Notes (Signed)
Dr. Harlon Flor called regarding pain prescription. States he will call same in to pharmacy.

## 2011-12-03 NOTE — Anesthesia Preprocedure Evaluation (Signed)
Anesthesia Evaluation  Patient identified by MRN, date of birth, ID band Patient awake    Reviewed: Allergy & Precautions, H&P , NPO status , Patient's Chart, lab work & pertinent test results  Airway Mallampati: II  Neck ROM: full    Dental   Pulmonary former smoker,          Cardiovascular hypertension,     Neuro/Psych    GI/Hepatic GERD-  ,  Endo/Other  diabetes, Type 2  Renal/GU      Musculoskeletal  (+) Arthritis -,   Abdominal   Peds  Hematology   Anesthesia Other Findings   Reproductive/Obstetrics                           Anesthesia Physical Anesthesia Plan  ASA: III  Anesthesia Plan: MAC   Post-op Pain Management:    Induction: Intravenous  Airway Management Planned: Nasal Cannula  Additional Equipment:   Intra-op Plan:   Post-operative Plan:   Informed Consent: I have reviewed the patients History and Physical, chart, labs and discussed the procedure including the risks, benefits and alternatives for the proposed anesthesia with the patient or authorized representative who has indicated his/her understanding and acceptance.     Plan Discussed with: CRNA and Surgeon  Anesthesia Plan Comments:         Anesthesia Quick Evaluation

## 2011-12-03 NOTE — Anesthesia Postprocedure Evaluation (Signed)
Anesthesia Post Note  Patient: Dylan Orozco  Procedure(s) Performed: Procedure(s) (LRB): INSERTION OF MINI SHUNT (Left)  Anesthesia type: MAC  Patient location: PACU  Post pain: Pain level controlled and Adequate analgesia  Post assessment: Post-op Vital signs reviewed, Patient's Cardiovascular Status Stable and Respiratory Function Stable  Last Vitals:  Filed Vitals:   12/03/11 1148  BP: 159/97  Pulse: 57  Temp: 36.5 C  Resp: 16    Post vital signs: Reviewed and stable  Level of consciousness: awake, alert  and oriented  Complications: No apparent anesthesia complications

## 2011-12-03 NOTE — Op Note (Signed)
Preoperative diagnosis: Uncontrolled glaucoma left eye Postoperative diagnosis: Same Procedure express glaucoma shunt device with mitomycin-C left eye Complications: None Anesthesia: 2% Xylocaine with epinephrine a 50-50 mixture 0.75% Marcaine with ampule Wydase Procedure the patient was taken to the operating room his face was prepped and draped in the usual sterile fashion after the aforementioned local anesthetic agent had been given with a peribulbar block and pressure had been applied to the eye. Following this with the microscope in position in the surgeon sitting at the 12:00 position a 6-0 nylon suture was passed through clear cornea to infraducted the eye fixate with a hemostat following this using a Hoskins forceps and a shot Wescott scissors incision was made at the superior limbus extending with a blunt Wescott scissors posteriorly bleeding was controlled with cautery a Tooke blade was used to recess T9 fibers a 45 blade was used to fashion a limbal half thickness scleral flap the flap was elevated using degrees however forceps and the 5700 blade following this mitomycin-C 0.4 mg per cc was placed on a Gelfoam sponge and allowed to stay under the conjunctiva for 2 minutes. The sponge was removed and the eye was irrigated with 40 cc of balanced salt solution. Following this a paracentesis tract was formed at the 3:30 position injecting BSS into the anterior chamber. A 25-gauge needle was attached to Provisc the scleral flap was elevated and the anterior chamber was entered under the scleral flap at the limbus with the tip entering the eye the anterior chamber remained formed small amount Provisc was injected in the eye then the mini shunt was examined and noted to have no defects the mini shunt is an express glaucoma filtration device lot #[id number] SN #[id number] it was injected and placed in the anterior chamber without any occlusion 3 interrupted 10-0 nylon sutures were placed to secure the scleral  flap and 9-0 Vicryl on a BV 100 needle was used to suture the conjunctiva BSS was injected in the anterior chamber the bleb elevated and was Seidel negative. Conjunctival injection of Kenalog 4 mg was given in the inferior nasal quadrant. Topical TobraDex ointment was applied to the eye a patch and Fox U. were placed and the patient returned to recovery area stable condition [employer].D.

## 2011-12-04 ENCOUNTER — Encounter (HOSPITAL_COMMUNITY): Payer: Self-pay | Admitting: Ophthalmology

## 2012-01-11 ENCOUNTER — Encounter: Payer: Self-pay | Admitting: Internal Medicine

## 2012-01-11 ENCOUNTER — Ambulatory Visit (INDEPENDENT_AMBULATORY_CARE_PROVIDER_SITE_OTHER): Payer: Medicare Other | Admitting: Internal Medicine

## 2012-01-11 VITALS — BP 128/80 | HR 72 | Temp 98.0°F | Wt 162.4 lb

## 2012-01-11 DIAGNOSIS — H6120 Impacted cerumen, unspecified ear: Secondary | ICD-10-CM

## 2012-01-11 DIAGNOSIS — J209 Acute bronchitis, unspecified: Secondary | ICD-10-CM

## 2012-01-11 DIAGNOSIS — H6123 Impacted cerumen, bilateral: Secondary | ICD-10-CM

## 2012-01-11 DIAGNOSIS — H9190 Unspecified hearing loss, unspecified ear: Secondary | ICD-10-CM

## 2012-01-11 MED ORDER — AZITHROMYCIN 250 MG PO TABS: ORAL_TABLET | ORAL | Status: DC

## 2012-01-11 NOTE — Progress Notes (Signed)
  Subjective:    Patient ID: Dylan Orozco, male    DOB: [birth year], 78 y.o.   MRN: [id number]  HPI  Symptoms began 10-14 days ago as pain & itching in his ears. Using Q-tips to remove wax actually aggravated the symptoms. He has noted hearing acuity loss  He also noted some right lateral maxillary sinus discomfort as well as sore throat. He has had white sputum production; it becomes more gray later in the day.  No Rx to date    Review of Systems He denies associated fever, chills, sweats, or frontal headache. He's had no purulent nasal discharge     Objective:   Physical Exam General appearance:good health ;well nourished; no acute distress or increased work of breathing is present.  No  lymphadenopathy about the head, neck, or axilla noted.   Eyes: No conjunctival inflammation or lid edema is present.   Ears:  External ear exam shows no significant lesions or deformities.  Otoscopic examination reveals impactions bilaterally . Decreased hearing R > L  Nose:  External nasal examination shows no deformity or inflammation. Nasal mucosa are pink and moist without lesions or exudates. No septal dislocation or deviation.No obstruction to airflow.   Oral exam: Dental hygiene is good; lips and gums are healthy appearing.There is no oropharyngeal erythema or exudate noted.   Neck:  No deformities,  masses, or tenderness noted.      Heart:  Normal rate and regular rhythm. S1 and S2 normal without gallop,  click, rub or other extra sounds. Grade 1/2 over 6 systolic murmur   Lungs:Chest clear to auscultation; no wheezes, rhonchi,rales ,or rubs present.No increased work of breathing.  Decreased BS  Extremities:  No cyanosis, edema, or clubbing  noted    Skin: Warm & dry           Assessment & Plan:  #1 acute bronchitis w/o bronchospasm Plan: See orders and recommendations

## 2012-01-11 NOTE — Patient Instructions (Addendum)
Please do not use Q-tips as we discussed. Should wax build up occur, please put 2-3 drops of mineral oil in the ear at night and cover the canal with a  cotton ball. In the morning fill the canal with hydrogen peroxide & leave  for 10-15 minutes. Following this shower and use the thinnest washrag available to wick out the wax.  

## 2012-01-12 ENCOUNTER — Other Ambulatory Visit: Payer: Self-pay | Admitting: Internal Medicine

## 2012-05-23 ENCOUNTER — Encounter: Payer: Self-pay | Admitting: Podiatry

## 2012-05-23 ENCOUNTER — Ambulatory Visit (INDEPENDENT_AMBULATORY_CARE_PROVIDER_SITE_OTHER): Payer: Medicare Other | Admitting: Podiatry

## 2012-05-23 VITALS — BP 144/86 | HR 58 | Ht 69.5 in | Wt 162.0 lb

## 2012-05-23 DIAGNOSIS — M25579 Pain in unspecified ankle and joints of unspecified foot: Secondary | ICD-10-CM

## 2012-05-23 DIAGNOSIS — B351 Tinea unguium: Secondary | ICD-10-CM

## 2012-05-23 NOTE — Progress Notes (Signed)
Subjective: 79 y.o. year old male patient presents complaining of painful nails.  HgA1C was 6.8 last December 2013.  Patient requests toe nails trimmed. Right great toe nail is very thick and painful.   Patient Summary List & History reviewed for allergies, medications, medical problems and surgical history.  Review of Systems - General ROS: negative for - chills, fatigue, fever, night sweats, sleep disturbance, weight gain or weight loss Ophthalmic ROS: Lost left eye sight from Glaucoma. ENT ROS: negative Allergy and Immunology ROS: Hayfever with pollens. Endocrine ROS: negative Respiratory ROS: no cough, shortness of breath, or wheezing Cardiovascular ROS: no chest pain or dyspnea on exertion Gastrointestinal ROS: no abdominal pain, change in bowel habits, or black or bloody stools Genito-Urinary ROS: no dysuria, trouble voiding, or hematuria Musculoskeletal ROS: Stiff joint in body in the morning when getting up.   Objective: Dermatologic:  Thick deformed nail right hallux. Hypertrophic nail with mycosis x 10.  Vascular: Posterior tibial pulses palpable bilateral. Dorsalis pedis pulses not palpable bilateral. Both feet are cool on forefoot area.  No trophic change or color change or edema noted.  Orthopedic:  Pes planus with ankle pronation bilateral. Mild contracture lesser digits bilateral.  Neurologic:  All sensory testing elicit normal response (Monofilament, Vibratory, and ankle DTR).  Assessment: Dystrophic mycotic nails x 10. NIDDM under control.  Treatment: All mycotic nails debrided.  Return in 3 months or sooner as needed.

## 2012-07-23 ENCOUNTER — Other Ambulatory Visit (INDEPENDENT_AMBULATORY_CARE_PROVIDER_SITE_OTHER): Payer: Medicare Other

## 2012-07-23 DIAGNOSIS — E1110 Type 2 diabetes mellitus with ketoacidosis without coma: Secondary | ICD-10-CM

## 2012-07-23 DIAGNOSIS — E1310 Other specified diabetes mellitus with ketoacidosis without coma: Secondary | ICD-10-CM

## 2012-08-22 ENCOUNTER — Ambulatory Visit (INDEPENDENT_AMBULATORY_CARE_PROVIDER_SITE_OTHER): Payer: Medicare Other | Admitting: Podiatry

## 2012-08-22 VITALS — BP 169/95 | HR 66 | Ht 69.0 in | Wt 162.0 lb

## 2012-08-22 DIAGNOSIS — B351 Tinea unguium: Secondary | ICD-10-CM

## 2012-08-22 DIAGNOSIS — M25579 Pain in unspecified ankle and joints of unspecified foot: Secondary | ICD-10-CM

## 2012-08-22 NOTE — Progress Notes (Signed)
79 year old presents requesting toe nails trimmed.  Recently lost left eye sight from Glaucoma secondary to diabetes. His recent HgA1c was 6.8.  Objective: Pedal pulses are not palpable. Thick dystrophic nails x 10. No new problems.  Assessment: Onychomycosis x 10. Type II DM under control.  Plan: Palliation prn. Debrided all nails x 10.

## 2012-08-27 ENCOUNTER — Other Ambulatory Visit: Payer: Self-pay

## 2012-09-02 ENCOUNTER — Ambulatory Visit (INDEPENDENT_AMBULATORY_CARE_PROVIDER_SITE_OTHER): Payer: Medicare Other | Admitting: Internal Medicine

## 2012-09-02 ENCOUNTER — Encounter: Payer: Self-pay | Admitting: Internal Medicine

## 2012-09-02 VITALS — BP 162/90 | HR 77 | Temp 98.3°F | Resp 12 | Ht 69.0 in | Wt 165.0 lb

## 2012-09-02 DIAGNOSIS — Z860100 Personal history of colon polyps, unspecified: Secondary | ICD-10-CM

## 2012-09-02 DIAGNOSIS — E119 Type 2 diabetes mellitus without complications: Secondary | ICD-10-CM

## 2012-09-02 DIAGNOSIS — Z23 Encounter for immunization: Secondary | ICD-10-CM

## 2012-09-02 DIAGNOSIS — E782 Mixed hyperlipidemia: Secondary | ICD-10-CM

## 2012-09-02 DIAGNOSIS — Z0000 Encounter for general adult medical examination without abnormal findings: Secondary | ICD-10-CM

## 2012-09-02 DIAGNOSIS — I10 Essential (primary) hypertension: Secondary | ICD-10-CM

## 2012-09-02 LAB — LIPID PANEL
Cholesterol: 223 mg/dL — ABNORMAL HIGH (ref 0–200)
Total CHOL/HDL Ratio: 3
Triglycerides: 56 mg/dL (ref 0.0–149.0)
VLDL: 11.2 mg/dL (ref 0.0–40.0)

## 2012-09-02 LAB — HEPATIC FUNCTION PANEL
ALT: 20 U/L (ref 0–53)
AST: 29 U/L (ref 0–37)
Bilirubin, Direct: 0 mg/dL (ref 0.0–0.3)
Total Bilirubin: 0.6 mg/dL (ref 0.3–1.2)

## 2012-09-02 LAB — BASIC METABOLIC PANEL
BUN: 14 mg/dL (ref 6–23)
Calcium: 9.5 mg/dL (ref 8.4–10.5)
Creatinine, Ser: 1.2 mg/dL (ref 0.4–1.5)
GFR: 77.66 mL/min (ref >60.00)
Glucose, Bld: 176 mg/dL — ABNORMAL HIGH (ref 70–99)

## 2012-09-02 LAB — CBC WITH DIFFERENTIAL/PLATELET
Basophils Absolute: 0 10*3/uL (ref 0.0–0.1)
Eosinophils Relative: 2.6 % (ref 0.0–5.0)
Monocytes Relative: 8.8 % (ref 3.0–12.0)
Neutrophils Relative %: 57.3 % (ref 43.0–77.0)
Platelets: 106 10*3/uL — ABNORMAL LOW (ref 150.0–400.0)
RDW: 13.9 % (ref 11.5–14.6)
WBC: 3.6 10*3/uL — ABNORMAL LOW (ref 4.5–10.5)

## 2012-09-02 LAB — TSH: TSH: 0.62 u[IU]/mL (ref 0.35–5.50)

## 2012-09-02 MED ORDER — LOSARTAN POTASSIUM 50 MG PO TABS: 50.0000 mg | ORAL_TABLET | Freq: Every day | ORAL | Status: DC

## 2012-09-02 NOTE — Patient Instructions (Addendum)
Minimal Blood Pressure Goal= AVERAGE < 140/90;  Ideal is an AVERAGE < 135/85. This AVERAGE should be calculated from @ least 5-7 BP readings taken @ different times of day on different days of week. You should not respond to isolated BP readings , but rather the AVERAGE for that week .Please bring your  blood pressure cuff to office visits to verify that it is reliable.It  can also be checked against the blood pressure device at the pharmacy. Finger or wrist cuffs are not dependable; an arm cuff is. Exercise at least 30-45 minutes a day,  3-4 days a week.  Eat a low-fat diet with lots of fruits and vegetables, up to 7-9 servings per day. This would eliminate the need for vitamin supplements. Consume less than 40 grams of sugar (preferably ZERO) per day from foods & drinks with High Fructose Corn Sugar as #1,2,3 or # 4 on label. Health Care Power of Attorney & Living Will. Complete these if not in place ; these place you in charge of your health care decisions.

## 2012-09-02 NOTE — Progress Notes (Signed)
Subjective:    Patient ID: Dylan Orozco, male    DOB: [birth year], 79 y.o.   MRN: [id number]  HPI Medicare Wellness Visit:  Psychosocial & medical history were reviewed as required by Medicare (abuse,antisocial behavioral risks,firearm risk).  Social history: caffeine: 1 cup / day 3-4 X/ week , alcohol: rarely  ,  tobacco use:  Quit 1995 Exercise : 30 min 2-3 X / week as treadmill w/o symptoms Home & personal  safety / fall risk:no Limitations of activities of daily living:no Seatbelt  and smoke alarm use:yes Power of Attorney/Living Will status : needed Ophthalmology exam status :current Hearing evaluation status:not current Orientation :oriented X 3 Memory & recall :good Spelling  testing:good Active depression / anxiety:denied Foreign travel history : 57 [place] Immunization status for Shingles /Flu/ PNA/ tetanus : Shingles needed Transfusion history:  1975 Preventive health surveillance status of colonoscopy as per protocol/ WUJ:[id number] Dental care:  Seldom; brushes> 2X/day Chart reviewed &  Updated. Active issues reviewed & addressed.      Review of Systems HYPERTENSION: Disease Monitoring: creat 1.16 ; GFR 69 11/13 Blood pressure range-no monitor Chest pain, palpitations- no       Dyspnea- no Medications: Compliance- no meds now Diet: "increased potato chips" & carbs  Lightheadedness,Syncope- no    Edema- no  DIABETES: Disease Monitoring:A1c 7.4% on 07/23/12 Blood Sugar ranges-no monitor  Polyuria/phagia/dipsia- polyuria due to increased fluids; nocturia X 2      Visual problems-glaucoma ; Dr Eulah Pont Medications: Compliance- no meds  Hypoglycemic symptoms- no  HYPERLIPIDEMIA: Disease Monitoring: See symptoms for Hypertension Medications: Compliance- no statin Abd pain, bowel changes- occasional pain with constipation   Muscle aches- in am , resolution with mobilization        Objective:   Physical Exam Gen.: Thin but healthy and well-nourished  in appearance. Alert, appropriate and cooperative throughout exam.Appears MUCH younger than stated age  Head: Normocephalic without obvious abnormalities; goatee ; head shaven.  Eyes: No corneal or conjunctival inflammation noted. Blind OS; decreased vision OD even with lens Ears: External  ear exam reveals no significant lesions or deformities. Canals clear .TMs normal. Hearing is grossly decreased bilaterally. Nose: External nasal exam reveals no deformity or inflammation. Nasal mucosa are pink and moist. No lesions or exudates noted.  Mouth: Oral mucosa and oropharynx reveal no lesions or exudates. Teeth in good repair. Neck: No deformities, masses, or tenderness noted. Range of motion decreased. Thyroid small. Lungs: Normal respiratory effort; chest expands symmetrically. Lungs are clear to auscultation without rales, wheezes, or increased work of breathing. Heart: Normal rate and rhythm. Accentuated  S1 ; normal S2. No gallop, click, or rub. No murmur. Abdomen: Bowel sounds normal; abdomen soft and nontender. No masses, organomegaly or hernias noted. Genitalia:As per Dr Brunilda Payor                                  Musculoskeletal/extremities: No deformity or scoliosis noted of  the thoracic or lumbar spine.   No  cyanosis, edema, or significant extremity  deformity noted. Range of motion slightly decreased in lower extremities .Tone & strength  Normal. Joints normal . Chronic toe nail changes. Clubbing of fingernails.Pes planus Able to lie down & sit up w/o help. Negative SLR bilaterally Vascular: Carotid, radial artery,  and  posterior tibial pulses are full and equal. Decreased DPP.No bruits present. Neurologic: Alert and oriented x3. Deep tendon reflexes symmetrical and normal.   Light  touch normal over feet.     Skin: Intact without suspicious lesions or rashes.Feet dry Lymph: No cervical, axillary lymphadenopathy present. Psych: Mood and affect are normal. Normally interactive                                                                                         Assessment & Plan:  #1 Medicare Wellness Exam; criteria met ; data entered #2 Problem List/Diagnoses reviewed; DM control fair; blood pressure uncontrolled #3 BP risk discussed Plan:  Assessments made/ Orders entered

## 2012-09-03 ENCOUNTER — Ambulatory Visit: Payer: Medicare Other

## 2012-09-03 DIAGNOSIS — R748 Abnormal levels of other serum enzymes: Secondary | ICD-10-CM

## 2012-09-03 LAB — GAMMA GT: GGT: 13 U/L (ref 7–51)

## 2012-10-21 ENCOUNTER — Ambulatory Visit (INDEPENDENT_AMBULATORY_CARE_PROVIDER_SITE_OTHER): Payer: Medicare Other

## 2012-10-21 DIAGNOSIS — Z23 Encounter for immunization: Secondary | ICD-10-CM

## 2012-11-28 ENCOUNTER — Ambulatory Visit: Payer: Medicare Other | Admitting: Podiatry

## 2013-02-11 ENCOUNTER — Ambulatory Visit (INDEPENDENT_AMBULATORY_CARE_PROVIDER_SITE_OTHER): Payer: Medicare Other | Admitting: Podiatry

## 2013-02-11 ENCOUNTER — Encounter: Payer: Self-pay | Admitting: Podiatry

## 2013-02-11 VITALS — BP 134/79 | HR 86 | Ht 69.0 in | Wt 165.0 lb

## 2013-02-11 DIAGNOSIS — B351 Tinea unguium: Secondary | ICD-10-CM

## 2013-02-11 DIAGNOSIS — M79609 Pain in unspecified limb: Secondary | ICD-10-CM

## 2013-02-11 DIAGNOSIS — M79673 Pain in unspecified foot: Secondary | ICD-10-CM

## 2013-02-11 NOTE — Patient Instructions (Signed)
Seen for hypertrophic nails. All nails debrided. Return in 3 months or as needed.  

## 2013-02-11 NOTE — Progress Notes (Signed)
Subjective: 80 year old presents requesting toe nails trimmed.  Left big toe hurts in shoes with thick toe nails.   Objective: Pedal pulses are faintly palpable.  Thick dystrophic nails x 10.  Very thick and enlarged right great toe nail with pain.  No new problems.   Assessment: Onychomycosis x 10.  Type II DM under control.   Plan: Palliation prn.  Debrided all nails x 10.

## 2013-03-05 ENCOUNTER — Other Ambulatory Visit: Payer: Self-pay | Admitting: Internal Medicine

## 2013-03-09 NOTE — Telephone Encounter (Signed)
Rx sent to the pharmacy by e-script.//AB/CMA 

## 2013-07-06 ENCOUNTER — Ambulatory Visit (INDEPENDENT_AMBULATORY_CARE_PROVIDER_SITE_OTHER): Payer: Medicare Other | Admitting: Internal Medicine

## 2013-07-06 ENCOUNTER — Encounter: Payer: Self-pay | Admitting: Internal Medicine

## 2013-07-06 VITALS — BP 169/95 | HR 60 | Temp 97.9°F | Wt 166.0 lb

## 2013-07-06 DIAGNOSIS — R82998 Other abnormal findings in urine: Secondary | ICD-10-CM

## 2013-07-06 DIAGNOSIS — I10 Essential (primary) hypertension: Secondary | ICD-10-CM

## 2013-07-06 DIAGNOSIS — R300 Dysuria: Secondary | ICD-10-CM

## 2013-07-06 LAB — POCT URINALYSIS DIPSTICK
Bilirubin, UA: NEGATIVE
GLUCOSE UA: NEGATIVE
Ketones, UA: NEGATIVE
Leukocytes, UA: NEGATIVE
NITRITE UA: NEGATIVE
Protein, UA: NEGATIVE
SPEC GRAV UA: 1.015
UROBILINOGEN UA: NEGATIVE
pH, UA: 6

## 2013-07-06 MED ORDER — LOSARTAN POTASSIUM 25 MG PO TABS: 25.0000 mg | ORAL_TABLET | Freq: Every day | ORAL | Status: DC
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
## 2013-07-06 NOTE — Progress Notes (Signed)
Pre visit review using our clinic review tool, if applicable. No additional management support is needed unless otherwise documented below in the visit note. 

## 2013-07-06 NOTE — Patient Instructions (Signed)
Call if your urinary symptoms get worse  Restart a blood pressure medication call losartan, I sent a new prescription for 25 mg tablets, take one tablet daily  Come back in 3 weeks for a checkup regards your blood pressure

## 2013-07-06 NOTE — Assessment & Plan Note (Addendum)
Was on losartan 50 mg daily, self discontinued d/t dizziness (BP at the time?). BP today is elevated. Plan: Restart low dose of losartan, reassess in 2 weeks

## 2013-07-07 ENCOUNTER — Telehealth: Payer: Self-pay | Admitting: Internal Medicine

## 2013-07-07 LAB — URINALYSIS, ROUTINE W REFLEX MICROSCOPIC
BILIRUBIN URINE: NEGATIVE
HGB URINE DIPSTICK: NEGATIVE
KETONES UR: NEGATIVE
LEUKOCYTES UA: NEGATIVE
Nitrite: NEGATIVE
RBC / HPF: NONE SEEN (ref 0–?)
Specific Gravity, Urine: <=1.005 — AB (ref 1.000–1.030)
Total Protein, Urine: NEGATIVE
UROBILINOGEN UA: 0.2 (ref 0.0–1.0)
Urine Glucose: NEGATIVE
WBC, UA: NONE SEEN (ref 0–?)
pH: 7 (ref 5.0–8.0)

## 2013-07-07 NOTE — Telephone Encounter (Signed)
Relevant patient education assigned to patient using Emmi. ° °

## 2013-07-09 LAB — URINE CULTURE
COLONY COUNT: NO GROWTH
ORGANISM ID, BACTERIA: NO GROWTH

## 2013-07-28 ENCOUNTER — Encounter: Payer: Self-pay | Admitting: Internal Medicine

## 2013-07-28 ENCOUNTER — Ambulatory Visit (INDEPENDENT_AMBULATORY_CARE_PROVIDER_SITE_OTHER): Payer: Medicare Other | Admitting: Internal Medicine

## 2013-07-28 VITALS — BP 161/80 | HR 65 | Temp 98.4°F | Wt 164.0 lb

## 2013-07-28 DIAGNOSIS — E119 Type 2 diabetes mellitus without complications: Secondary | ICD-10-CM

## 2013-07-28 DIAGNOSIS — I10 Essential (primary) hypertension: Secondary | ICD-10-CM

## 2013-07-28 LAB — COMPREHENSIVE METABOLIC PANEL
ALT: 16 U/L (ref 0–53)
AST: 24 U/L (ref 0–37)
Albumin: 3.9 g/dL (ref 3.5–5.2)
Alkaline Phosphatase: 107 U/L (ref 39–117)
BUN: 15 mg/dL (ref 6–23)
CALCIUM: 9.7 mg/dL (ref 8.4–10.5)
CHLORIDE: 102 meq/L (ref 96–112)
CO2: 26 meq/L (ref 19–32)
CREATININE: 0.9 mg/dL (ref 0.4–1.5)
GFR: 99.74 mL/min (ref >60.00)
Glucose, Bld: 136 mg/dL — ABNORMAL HIGH (ref 70–99)
Potassium: 4.1 mEq/L (ref 3.5–5.1)
Sodium: 136 mEq/L (ref 135–145)
Total Bilirubin: 0.6 mg/dL (ref 0.2–1.2)
Total Protein: 7.4 g/dL (ref 6.0–8.3)

## 2013-07-28 LAB — HEMOGLOBIN A1C: Hgb A1c MFr Bld: 7.4 % — ABNORMAL HIGH (ref 4.6–6.5)

## 2013-07-28 NOTE — Assessment & Plan Note (Addendum)
History of diabetes,   no recent A1c, currently taking no medications. Plan: A1c, further advice w/ results

## 2013-07-28 NOTE — Patient Instructions (Signed)
Get your blood work before you leave   Next visit is for routine check up regards your blood sugar , blood pressure, fasting in 2-3 months  Please make an appointment    Increase Losartan to 50 mg every day, call for a prescription when needed    Check the  blood pressure 2 or 3 times a  week be sure it is between 110/60 and 140/85. Ideal blood pressure is 120/80. If it is consistently higher or lower, let me know

## 2013-07-28 NOTE — Progress Notes (Signed)
Pre-visit discussion using our clinic review tool. No additional management support is needed unless otherwise documented below in the visit note.  

## 2013-07-28 NOTE — Assessment & Plan Note (Signed)
Recently we started losartan 25 mg, he is tolerating it well, BP still in the 160s. Plan: Increase losartan to 50 mg, check a BMP, continue ambulatory BPs, follow up in 2 or 3 months
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
## 2013-07-31 MED ORDER — METFORMIN HCL 850 MG PO TABS: ORAL_TABLET | ORAL | Status: DC

## 2013-07-31 NOTE — Addendum Note (Signed)
Addended by: Peggyann Shoals on: 07/31/2013 03:15 PM   Modules accepted: Orders

## 2013-08-21 ENCOUNTER — Ambulatory Visit: Payer: Medicare Other | Admitting: Podiatry

## 2013-08-24 ENCOUNTER — Ambulatory Visit (INDEPENDENT_AMBULATORY_CARE_PROVIDER_SITE_OTHER): Payer: Medicare Other | Admitting: Podiatry

## 2013-08-24 ENCOUNTER — Encounter: Payer: Self-pay | Admitting: Podiatry

## 2013-08-24 VITALS — BP 162/95 | HR 73

## 2013-08-24 DIAGNOSIS — M79609 Pain in unspecified limb: Secondary | ICD-10-CM

## 2013-08-24 DIAGNOSIS — M79673 Pain in unspecified foot: Secondary | ICD-10-CM

## 2013-08-24 DIAGNOSIS — B351 Tinea unguium: Secondary | ICD-10-CM

## 2013-08-24 NOTE — Progress Notes (Signed)
Subjective:  80 year old male presents requesting toe nails trimmed.  He has eye problem with glaucoma and sight is gradually coming back on left eye.  Left big toe hurts in shoes with thick toe nails.   Objective: Pedal pulses are not palpable.  Thick dystrophic nails x 10.  Very thick and enlarged right great toe nail with pain.  No new problems.   Assessment: Onychomycosis x 10.  Type II DM under control.   Plan: Palliation prn.  Debrided all nails x 10. Return in 3 months or as needed.

## 2013-08-24 NOTE — Patient Instructions (Signed)
Seen for hypertrophic nails. All nails debrided. Return in 3 months or as needed.  

## 2013-09-02 ENCOUNTER — Encounter: Payer: Self-pay | Admitting: Gastroenterology

## 2013-09-10 ENCOUNTER — Encounter: Payer: Self-pay | Admitting: Internal Medicine

## 2013-09-10 ENCOUNTER — Ambulatory Visit (INDEPENDENT_AMBULATORY_CARE_PROVIDER_SITE_OTHER): Payer: Medicare Other | Admitting: Internal Medicine

## 2013-09-10 VITALS — BP 142/87 | HR 89 | Temp 98.0°F | Ht 69.0 in | Wt 158.5 lb

## 2013-09-10 DIAGNOSIS — I10 Essential (primary) hypertension: Secondary | ICD-10-CM

## 2013-09-10 DIAGNOSIS — E782 Mixed hyperlipidemia: Secondary | ICD-10-CM

## 2013-09-10 DIAGNOSIS — Z0000 Encounter for general adult medical examination without abnormal findings: Secondary | ICD-10-CM | POA: Insufficient documentation

## 2013-09-10 DIAGNOSIS — Z23 Encounter for immunization: Secondary | ICD-10-CM

## 2013-09-10 DIAGNOSIS — E119 Type 2 diabetes mellitus without complications: Secondary | ICD-10-CM

## 2013-09-10 LAB — CBC WITH DIFFERENTIAL/PLATELET
BASOS ABS: 0 10*3/uL (ref 0.0–0.1)
Basophils Relative: 0.9 % (ref 0.0–3.0)
EOS ABS: 0.1 10*3/uL (ref 0.0–0.7)
Eosinophils Relative: 2.3 % (ref 0.0–5.0)
HCT: 39.9 % (ref 39.0–52.0)
Hemoglobin: 12.9 g/dL — ABNORMAL LOW (ref 13.0–17.0)
LYMPHS PCT: 27.4 % (ref 12.0–46.0)
Lymphs Abs: 0.8 10*3/uL (ref 0.7–4.0)
MCHC: 32.4 g/dL (ref 30.0–36.0)
MCV: 90.1 fl (ref 78.0–100.0)
Monocytes Absolute: 0.3 10*3/uL (ref 0.1–1.0)
Monocytes Relative: 8.5 % (ref 3.0–12.0)
Neutro Abs: 1.8 10*3/uL (ref 1.4–7.7)
Neutrophils Relative %: 60.9 % (ref 43.0–77.0)
Platelets: 114 10*3/uL — ABNORMAL LOW (ref 150.0–400.0)
RBC: 4.43 Mil/uL (ref 4.22–5.81)
RDW: 13.9 % (ref 11.5–15.5)
WBC: 3 10*3/uL — ABNORMAL LOW (ref 4.0–10.5)

## 2013-09-10 LAB — ALT: ALT: 10 U/L (ref 0–53)

## 2013-09-10 LAB — LIPID PANEL
CHOL/HDL RATIO: 4
Cholesterol: 243 mg/dL — ABNORMAL HIGH (ref 0–200)
HDL: 66.6 mg/dL (ref >39.00)
LDL Cholesterol: 163 mg/dL — ABNORMAL HIGH (ref 0–99)
NonHDL: 176.4
TRIGLYCERIDES: 69 mg/dL (ref 0.0–149.0)
VLDL: 13.8 mg/dL (ref 0.0–40.0)

## 2013-09-10 LAB — AST: AST: 19 U/L (ref 0–37)

## 2013-09-10 NOTE — Assessment & Plan Note (Signed)
Seems  well-controlled with losartan, last BMP normal. No change

## 2013-09-10 NOTE — Progress Notes (Signed)
Pre-visit discussion using our clinic review tool. No additional management support is needed unless otherwise documented below in the visit note.  

## 2013-09-10 NOTE — Patient Instructions (Signed)
Get your blood work before you leave   Don't forget your flu shot !    Next visit is for routine check up regards your blood sugar in 2 months   No need to come back fasting Please make an appointment     Fall Prevention and Home Safety Falls cause injuries and can affect all age groups. It is possible to use preventive measures to significantly decrease the likelihood of falls. There are many simple measures which can make your home safer and prevent falls. OUTDOORS  Repair cracks and edges of walkways and driveways.  Remove high doorway thresholds.  Trim shrubbery on the main path into your home.  Have good outside lighting.  Clear walkways of tools, rocks, debris, and clutter.  Check that handrails are not broken and are securely fastened. Both sides of steps should have handrails.  Have leaves, snow, and ice cleared regularly.  Use sand or salt on walkways during winter months.  In the garage, clean up grease or oil spills. BATHROOM  Install night lights.  Install grab bars by the toilet and in the tub and shower.  Use non-skid mats or decals in the tub or shower.  Place a plastic non-slip stool in the shower to sit on, if needed.  Keep floors dry and clean up all water on the floor immediately.  Remove soap buildup in the tub or shower on a regular basis.  Secure bath mats with non-slip, double-sided rug tape.  Remove throw rugs and tripping hazards from the floors. BEDROOMS  Install night lights.  Make sure a bedside light is easy to reach.  Do not use oversized bedding.  Keep a telephone by your bedside.  Have a firm chair with side arms to use for getting dressed.  Remove throw rugs and tripping hazards from the floor. KITCHEN  Keep handles on pots and pans turned toward the center of the stove. Use back burners when possible.  Clean up spills quickly and allow time for drying.  Avoid walking on wet floors.  Avoid hot utensils and  knives.  Position shelves so they are not too high or low.  Place commonly used objects within easy reach.  If necessary, use a sturdy step stool with a grab bar when reaching.  Keep electrical cables out of the way.  Do not use floor polish or wax that makes floors slippery. If you must use wax, use non-skid floor wax.  Remove throw rugs and tripping hazards from the floor. STAIRWAYS  Never leave objects on stairs.  Place handrails on both sides of stairways and use them. Fix any loose handrails. Make sure handrails on both sides of the stairways are as long as the stairs.  Check carpeting to make sure it is firmly attached along stairs. Make repairs to worn or loose carpet promptly.  Avoid placing throw rugs at the top or bottom of stairways, or properly secure the rug with carpet tape to prevent slippage. Get rid of throw rugs, if possible.  Have an electrician put in a light switch at the top and bottom of the stairs. OTHER FALL PREVENTION TIPS  Wear low-heel or rubber-soled shoes that are supportive and fit well. Wear closed toe shoes.  When using a stepladder, make sure it is fully opened and both spreaders are firmly locked. Do not climb a closed stepladder.  Add color or contrast paint or tape to grab bars and handrails in your home. Place contrasting color strips on first and last  steps.  Learn and use mobility aids as needed. Install an electrical emergency response system.  Turn on lights to avoid dark areas. Replace light bulbs that burn out immediately. Get light switches that glow.  Arrange furniture to create clear pathways. Keep furniture in the same place.  Firmly attach carpet with non-skid or double-sided tape.  Eliminate uneven floor surfaces.  Select a carpet pattern that does not visually hide the edge of steps.  Be aware of all pets. OTHER HOME SAFETY TIPS  Set the water temperature for 120 F (48.8 C).  Keep emergency numbers on or near the  telephone.  Keep smoke detectors on every level of the home and near sleeping areas. Document Released: 12/29/2001 Document Revised: 07/10/2011 Document Reviewed: 03/30/2011 [facility] Patient Information 2015 [place], [place]. This information is not intended to replace advice given to you by your health care provider. Make sure you discuss any questions you have with your health care provider.
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
## 2013-09-10 NOTE — Assessment & Plan Note (Signed)
Previously on pravachol, self discontinued due to constipation. Today he reports he still has some constipation. Plan: Check FLP

## 2013-09-10 NOTE — Assessment & Plan Note (Signed)
Based on last A1c he started metformin, he is tolerating it well, no ambulatory CBGs. Has noted some weight loss however there is no red flag symptoms such as headache, fever, chills or stomach pain. Plan: Check LFTs, return to the office 2 months, to reassess her A1c and weight loss

## 2013-09-10 NOTE — Assessment & Plan Note (Addendum)
Td? Will assess on RTC pnm 23--2014 prevnar--today Flu shot recommended  Last Cscope 02-2011, 2 polyps, next 2018 PSAs per Dr Janice Norrie Labs Diet-exercise discussed  Palpable Ao--- get a u/s

## 2013-09-14 MED ORDER — ATORVASTATIN CALCIUM 20 MG PO TABS: 20.0000 mg | ORAL_TABLET | Freq: Every day | ORAL | Status: DC

## 2013-09-14 NOTE — Addendum Note (Signed)
Addended by: Wilfrid Lund on: 09/14/2013 11:46 AM   Modules accepted: Orders

## 2013-09-15 ENCOUNTER — Telehealth: Payer: Self-pay

## 2013-09-15 DIAGNOSIS — R0989 Other specified symptoms and signs involving the circulatory and respiratory systems: Secondary | ICD-10-CM

## 2013-09-15 NOTE — Telephone Encounter (Signed)
Abdominal US duplex per Cardio. Order entered

## 2013-09-22 ENCOUNTER — Ambulatory Visit (HOSPITAL_BASED_OUTPATIENT_CLINIC_OR_DEPARTMENT_OTHER)
Admission: RE | Admit: 2013-09-22 | Discharge: 2013-09-22 | Disposition: A | Discharge status: Home / Self Care | Payer: Medicare Other | Source: Ambulatory Visit | Attending: Internal Medicine | Admitting: Internal Medicine

## 2013-09-22 DIAGNOSIS — R0989 Other specified symptoms and signs involving the circulatory and respiratory systems: Secondary | ICD-10-CM | POA: Diagnosis not present

## 2013-09-22 DIAGNOSIS — I77811 Abdominal aortic ectasia: Secondary | ICD-10-CM | POA: Insufficient documentation

## 2013-10-15 ENCOUNTER — Encounter: Payer: Self-pay | Admitting: Internal Medicine

## 2013-10-15 ENCOUNTER — Ambulatory Visit (INDEPENDENT_AMBULATORY_CARE_PROVIDER_SITE_OTHER): Payer: Medicare Other | Admitting: Internal Medicine

## 2013-10-15 VITALS — BP 132/78 | HR 78 | Temp 97.7°F | Wt 159.1 lb

## 2013-10-15 DIAGNOSIS — D61818 Other pancytopenia: Secondary | ICD-10-CM

## 2013-10-15 DIAGNOSIS — E119 Type 2 diabetes mellitus without complications: Secondary | ICD-10-CM

## 2013-10-15 DIAGNOSIS — K5900 Constipation, unspecified: Secondary | ICD-10-CM | POA: Insufficient documentation

## 2013-10-15 DIAGNOSIS — E782 Mixed hyperlipidemia: Secondary | ICD-10-CM

## 2013-10-15 DIAGNOSIS — Z23 Encounter for immunization: Secondary | ICD-10-CM

## 2013-10-15 DIAGNOSIS — I10 Essential (primary) hypertension: Secondary | ICD-10-CM

## 2013-10-15 LAB — LIPID PANEL
Cholesterol: 166 mg/dL (ref 0–200)
HDL: 60.4 mg/dL (ref >39.00)
LDL Cholesterol: 95 mg/dL (ref 0–99)
NONHDL: 105.6
TRIGLYCERIDES: 55 mg/dL (ref 0.0–149.0)
Total CHOL/HDL Ratio: 3
VLDL: 11 mg/dL (ref 0.0–40.0)

## 2013-10-15 LAB — AST: AST: 22 U/L (ref 0–37)

## 2013-10-15 LAB — ALT: ALT: 16 U/L (ref 0–53)

## 2013-10-15 LAB — HEMOGLOBIN A1C: Hgb A1c MFr Bld: 6.6 % — ABNORMAL HIGH (ref 4.6–6.5)

## 2013-10-15 NOTE — Assessment & Plan Note (Signed)
Patient restarted Lipitor, good compliance and tolerance, check labs

## 2013-10-15 NOTE — Assessment & Plan Note (Signed)
WBCs , platelets stable over time

## 2013-10-15 NOTE — Patient Instructions (Signed)
Get your blood work before you leave   MiraLax 17 g of fluids every day to prevent constipation   Please come back to the office in 4 months for a physical exam no fasting    Stop by the front desk and schedule the visit      Diabetes and Foot Care Diabetes may cause you to have problems because of poor blood supply (circulation) to your feet and legs. This may cause the skin on your feet to become thinner, break easier, and heal more slowly. Your skin may become dry, and the skin may peel and crack. You may also have nerve damage in your legs and feet causing decreased feeling in them. You may not notice minor injuries to your feet that could lead to infections or more serious problems. Taking care of your feet is one of the most important things you can do for yourself.  HOME CARE INSTRUCTIONS  Wear shoes at all times, even in the house. Do not go barefoot. Bare feet are easily injured.  Check your feet daily for blisters, cuts, and redness. If you cannot see the bottom of your feet, use a mirror or ask someone for help.  Wash your feet with warm water (do not use hot water) and mild soap. Then pat your feet and the areas between your toes until they are completely dry. Do not soak your feet as this can dry your skin.  Apply a moisturizing lotion or petroleum jelly (that does not contain alcohol and is unscented) to the skin on your feet and to dry, brittle toenails. Do not apply lotion between your toes.  Trim your toenails straight across. Do not dig under them or around the cuticle. File the edges of your nails with an emery board or nail file.  Do not cut corns or calluses or try to remove them with medicine.  Wear clean socks or stockings every day. Make sure they are not too tight. Do not wear knee-high stockings since they may decrease blood flow to your legs.  Wear shoes that fit properly and have enough cushioning. To break in new shoes, wear them for just a few hours a day. This  prevents you from injuring your feet. Always look in your shoes before you put them on to be sure there are no objects inside.  Do not cross your legs. This may decrease the blood flow to your feet.  If you find a minor scrape, cut, or break in the skin on your feet, keep it and the skin around it clean and dry. These areas may be cleansed with mild soap and water. Do not cleanse the area with peroxide, alcohol, or iodine.  When you remove an adhesive bandage, be sure not to damage the skin around it.  If you have a wound, look at it several times a day to make sure it is healing.  Do not use heating pads or hot water bottles. They may burn your skin. If you have lost feeling in your feet or legs, you may not know it is happening until it is too late.  Make sure your health care provider performs a complete foot exam at least annually or more often if you have foot problems. Report any cuts, sores, or bruises to your health care provider immediately. SEEK MEDICAL CARE IF:   You have an injury that is not healing.  You have cuts or breaks in the skin.  You have an ingrown nail.  You  notice redness on your legs or feet.  You feel burning or tingling in your legs or feet.  You have pain or cramps in your legs and feet.  Your legs or feet are numb.  Your feet always feel cold. SEEK IMMEDIATE MEDICAL CARE IF:   There is increasing redness, swelling, or pain in or around a wound.  There is a red line that goes up your leg.  Pus is coming from a wound.  You develop a fever or as directed by your health care provider.  You notice a bad smell coming from an ulcer or wound. Document Released: 01/06/2000 Document Revised: 09/10/2012 Document Reviewed: 06/17/2012 [facility] Patient Information 2015 [place], [place]. This information is not intended to replace advice given to you by your health care provider. Make sure you discuss any questions you have with your health care provider.
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
## 2013-10-15 NOTE — Progress Notes (Signed)
Pre visit review using our clinic review tool, if applicable. No additional management support is needed unless otherwise documented below in the visit note. 

## 2013-10-15 NOTE — Assessment & Plan Note (Signed)
Based on last A1c, started metformin, no side effects: Check A1c today. Feet exam w/o neuropathy, feet care was discussed nevertheless

## 2013-10-15 NOTE — Assessment & Plan Note (Signed)
Ongoing mild constipation, at some point patient felt he was related to Lipitor but symptoms have not increased w/ re- initiation of Lipitor. Plan: Continue taking drinking plenty of fluids and start MiraLax

## 2013-10-23 ENCOUNTER — Other Ambulatory Visit: Payer: Self-pay

## 2013-10-23 MED ORDER — LOSARTAN POTASSIUM 50 MG PO TABS
50.0000 mg | ORAL_TABLET | Freq: Every day | ORAL | Status: DC
Start: 2013-10-23 — End: 2014-05-10

## 2013-11-30 ENCOUNTER — Other Ambulatory Visit: Payer: Self-pay | Admitting: Internal Medicine

## 2014-01-19 ENCOUNTER — Other Ambulatory Visit: Payer: Self-pay | Admitting: Internal Medicine

## 2014-01-27 ENCOUNTER — Encounter: Payer: Self-pay | Admitting: Internal Medicine

## 2014-01-27 ENCOUNTER — Ambulatory Visit (INDEPENDENT_AMBULATORY_CARE_PROVIDER_SITE_OTHER): Payer: Medicare Other | Admitting: Internal Medicine

## 2014-01-27 VITALS — BP 155/93 | HR 77 | Temp 97.7°F | Ht 69.0 in | Wt 153.0 lb

## 2014-01-27 DIAGNOSIS — J069 Acute upper respiratory infection, unspecified: Secondary | ICD-10-CM

## 2014-01-27 MED ORDER — AZITHROMYCIN 250 MG PO TABS: ORAL_TABLET | ORAL | Status: DC

## 2014-01-27 NOTE — Progress Notes (Signed)
Pre visit review using our clinic review tool, if applicable. No additional management support is needed unless otherwise documented below in the visit note. 
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
## 2014-01-27 NOTE — Patient Instructions (Signed)
Rest, fluids , tylenol If  cough, take Robitussin DM   as needed  Use OTC Nasocort or Flonase : 2 nasal sprays on each side of the nose daily until you feel better claritin 10 mg 1 a day for 10-14 days, may cause drowsiness   Take the antibiotic as prescribed  (zithromax) if no better next week  Call if not gradually better over the next  10 days Call anytime if the symptoms are severe

## 2014-02-16 ENCOUNTER — Ambulatory Visit: Payer: Medicare Other | Admitting: Internal Medicine

## 2014-03-01 ENCOUNTER — Encounter: Payer: Self-pay | Admitting: Podiatry

## 2014-03-01 ENCOUNTER — Ambulatory Visit (INDEPENDENT_AMBULATORY_CARE_PROVIDER_SITE_OTHER): Payer: Medicare Other | Admitting: Podiatry

## 2014-03-01 VITALS — BP 166/94 | HR 72 | Ht 69.0 in | Wt 153.0 lb

## 2014-03-01 DIAGNOSIS — M79606 Pain in leg, unspecified: Secondary | ICD-10-CM

## 2014-03-01 DIAGNOSIS — B351 Tinea unguium: Secondary | ICD-10-CM

## 2014-03-01 NOTE — Patient Instructions (Signed)
Seen for hypertrophic nails. All nails debrided. Return in 3 months or as needed.  

## 2014-03-01 NOTE — Progress Notes (Signed)
Subjective:  81 year old male presents requesting toe nails trimmed. Left big toe hurts in shoes with thick toe nails.   Objective: Pedal pulses are not palpable.  Thick dystrophic nails x 10.  Very thick and enlarged right great toe nail with pain.  No new problems.   Assessment: Onychomycosis x 10.  Type II DM under control.   Plan: Palliation prn.  Debrided all nails x 10. Return in 3 months or as needed.

## 2014-03-23 HISTORY — PX: CATARACT EXTRACTION: SUR2

## 2014-04-02 ENCOUNTER — Other Ambulatory Visit: Payer: Self-pay | Admitting: Internal Medicine

## 2014-05-10 ENCOUNTER — Other Ambulatory Visit: Payer: Self-pay

## 2014-05-10 ENCOUNTER — Other Ambulatory Visit: Payer: Self-pay | Admitting: Internal Medicine

## 2014-05-20 ENCOUNTER — Other Ambulatory Visit: Payer: Self-pay

## 2014-05-27 ENCOUNTER — Telehealth: Payer: Self-pay | Admitting: Internal Medicine

## 2014-05-27 NOTE — Telephone Encounter (Signed)
FYI

## 2014-05-27 NOTE — Telephone Encounter (Signed)
°  Relation to pt: self Call back number: [phone]   Reason for call:  Pt stop taking metformin for the last 2 days due to weight loss (20lbs). Pt wold like to discuss side effects.

## 2014-05-28 NOTE — Telephone Encounter (Signed)
Pt has not been seen since 09/2013, per Dr. Larose Kells he would like for Pt to come for an office visit to discuss. Okay to overbook per Dr. Larose Kells.

## 2014-05-28 NOTE — Telephone Encounter (Signed)
Last seen for diabetes 10/15/2013. Please schedule office visit for next week; overbook okay

## 2014-06-01 ENCOUNTER — Encounter: Payer: Self-pay | Admitting: Internal Medicine

## 2014-06-01 ENCOUNTER — Ambulatory Visit (HOSPITAL_BASED_OUTPATIENT_CLINIC_OR_DEPARTMENT_OTHER)
Admission: RE | Admit: 2014-06-01 | Discharge: 2014-06-01 | Disposition: A | Discharge status: Home / Self Care | Payer: Medicare Other | Source: Ambulatory Visit | Attending: Internal Medicine | Admitting: Internal Medicine

## 2014-06-01 ENCOUNTER — Other Ambulatory Visit: Payer: Self-pay

## 2014-06-01 ENCOUNTER — Ambulatory Visit (INDEPENDENT_AMBULATORY_CARE_PROVIDER_SITE_OTHER): Payer: Medicare Other | Admitting: Internal Medicine

## 2014-06-01 VITALS — BP 129/78 | HR 83 | Temp 97.7°F | Ht 69.0 in | Wt 144.4 lb

## 2014-06-01 DIAGNOSIS — R634 Abnormal weight loss: Secondary | ICD-10-CM

## 2014-06-01 DIAGNOSIS — I499 Cardiac arrhythmia, unspecified: Secondary | ICD-10-CM

## 2014-06-01 DIAGNOSIS — R918 Other nonspecific abnormal finding of lung field: Secondary | ICD-10-CM | POA: Insufficient documentation

## 2014-06-01 DIAGNOSIS — E119 Type 2 diabetes mellitus without complications: Secondary | ICD-10-CM | POA: Diagnosis not present

## 2014-06-01 LAB — GLUCOSE, POCT (MANUAL RESULT ENTRY): POC Glucose: 114 mg/dl — AB (ref 70–99)

## 2014-06-01 MED ORDER — LOSARTAN POTASSIUM 50 MG PO TABS: 50.0000 mg | ORAL_TABLET | Freq: Every day | ORAL | Status: DC

## 2014-06-01 MED ORDER — ATORVASTATIN CALCIUM 20 MG PO TABS: 20.0000 mg | ORAL_TABLET | Freq: Every day | ORAL | Status: DC

## 2014-06-01 NOTE — Patient Instructions (Signed)
Get your blood work before you leave   Stop by the first floor and get the XR     Come back to the office in 4 weeks   for a routine check up

## 2014-06-01 NOTE — Progress Notes (Signed)
Pre visit review using our clinic review tool, if applicable. No additional management support is needed unless otherwise documented below in the visit note. 

## 2014-06-01 NOTE — Telephone Encounter (Signed)
Pt returned call and appt scheduled for 06/01/14 2:45pm with Dr. Larose Kells.

## 2014-06-01 NOTE — Telephone Encounter (Signed)
LVM advising pt to schedule a follow up appointment
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
## 2014-06-02 LAB — COMPREHENSIVE METABOLIC PANEL
ALBUMIN: 4.1 g/dL (ref 3.5–5.2)
ALT: 46 U/L (ref 0–53)
AST: 59 U/L — ABNORMAL HIGH (ref 0–37)
Alkaline Phosphatase: 106 U/L (ref 39–117)
BUN: 19 mg/dL (ref 6–23)
CHLORIDE: 106 meq/L (ref 96–112)
CO2: 27 meq/L (ref 19–32)
Calcium: 9.7 mg/dL (ref 8.4–10.5)
Creatinine, Ser: 0.9 mg/dL (ref 0.40–1.50)
GFR: 104.64 mL/min (ref >60.00)
Glucose, Bld: 112 mg/dL — ABNORMAL HIGH (ref 70–99)
Potassium: 4.5 mEq/L (ref 3.5–5.1)
Sodium: 138 mEq/L (ref 135–145)
TOTAL PROTEIN: 7.3 g/dL (ref 6.0–8.3)
Total Bilirubin: 0.3 mg/dL (ref 0.2–1.2)

## 2014-06-02 LAB — CBC WITH DIFFERENTIAL/PLATELET
BASOS PCT: 0.6 % (ref 0.0–3.0)
Basophils Absolute: 0 10*3/uL (ref 0.0–0.1)
EOS PCT: 3.6 % (ref 0.0–5.0)
Eosinophils Absolute: 0.1 10*3/uL (ref 0.0–0.7)
HEMATOCRIT: 39.5 % (ref 39.0–52.0)
HEMOGLOBIN: 12.7 g/dL — AB (ref 13.0–17.0)
LYMPHS ABS: 0.8 10*3/uL (ref 0.7–4.0)
Lymphocytes Relative: 20.5 % (ref 12.0–46.0)
MCHC: 32.2 g/dL (ref 30.0–36.0)
MCV: 90 fl (ref 78.0–100.0)
MONOS PCT: 11.8 % (ref 3.0–12.0)
Monocytes Absolute: 0.5 10*3/uL (ref 0.1–1.0)
NEUTROS ABS: 2.6 10*3/uL (ref 1.4–7.7)
Neutrophils Relative %: 63.5 % (ref 43.0–77.0)
PLATELETS: 125 10*3/uL — AB (ref 150.0–400.0)
RBC: 4.39 Mil/uL (ref 4.22–5.81)
RDW: 14.1 % (ref 11.5–15.5)
WBC: 4 10*3/uL (ref 4.0–10.5)

## 2014-06-02 LAB — HEMOGLOBIN A1C: Hgb A1c MFr Bld: 6.4 % (ref 4.6–6.5)

## 2014-06-02 LAB — TSH: TSH: 0.43 u[IU]/mL (ref 0.35–4.50)

## 2014-06-02 NOTE — Assessment & Plan Note (Signed)
Weight loss, Documented weight loss of 20 pounds in the last 2 years. Review of systems does not point to any particular etiology, he is a former smoker. He is also diabetic, CBG today 114. Related to metformin? He states that metformin decrease his appetite Plan: CMP, CBC, TSH. Chest x-ray Return to the office in one month.

## 2014-06-02 NOTE — Assessment & Plan Note (Signed)
Diabetes, Self discontinue metformin, reports poor appetite with it. Check A1c, further advice for results. Januvia?

## 2014-06-03 ENCOUNTER — Telehealth: Payer: Self-pay | Admitting: Internal Medicine

## 2014-06-03 DIAGNOSIS — R634 Abnormal weight loss: Secondary | ICD-10-CM

## 2014-06-03 NOTE — Telephone Encounter (Signed)
Call  patient: Chest x-ray looks okay, we do need to repeat view with nipple marker, please enter order. Has mild anemia, please add iron and ferritin. Follow-up in one month to reassess weight loss.

## 2014-06-04 ENCOUNTER — Ambulatory Visit (HOSPITAL_BASED_OUTPATIENT_CLINIC_OR_DEPARTMENT_OTHER)
Admission: RE | Admit: 2014-06-04 | Discharge: 2014-06-04 | Disposition: A | Discharge status: Home / Self Care | Payer: Medicare Other | Source: Ambulatory Visit | Attending: Internal Medicine | Admitting: Internal Medicine

## 2014-06-04 ENCOUNTER — Other Ambulatory Visit (INDEPENDENT_AMBULATORY_CARE_PROVIDER_SITE_OTHER): Payer: Medicare Other

## 2014-06-04 ENCOUNTER — Other Ambulatory Visit: Payer: Self-pay | Admitting: Internal Medicine

## 2014-06-04 DIAGNOSIS — R634 Abnormal weight loss: Secondary | ICD-10-CM

## 2014-06-04 DIAGNOSIS — D649 Anemia, unspecified: Secondary | ICD-10-CM

## 2014-06-04 DIAGNOSIS — R938 Abnormal findings on diagnostic imaging of other specified body structures: Secondary | ICD-10-CM | POA: Diagnosis present

## 2014-06-04 LAB — FERRITIN: FERRITIN: 117.7 ng/mL (ref 22.0–322.0)

## 2014-06-04 LAB — IRON: IRON: 98 ug/dL (ref 42–165)

## 2014-06-04 NOTE — Telephone Encounter (Signed)
Pt returning your call please call [phone]

## 2014-06-04 NOTE — Telephone Encounter (Signed)
LMOM informing Pt to return call.  

## 2014-06-04 NOTE — Telephone Encounter (Signed)
Spoke with Dylan Orozco, informed him of chest x-ray results. Asked if he could return to radiology to repeat for nipple markers. Dylan Orozco verbalized understanding and stated he would return to radiology as soon as he can.

## 2014-06-04 NOTE — Telephone Encounter (Signed)
Add on sent to main lab for iron and ferritin.

## 2014-06-07 ENCOUNTER — Encounter: Payer: Self-pay | Admitting: Podiatry

## 2014-06-07 ENCOUNTER — Ambulatory Visit (INDEPENDENT_AMBULATORY_CARE_PROVIDER_SITE_OTHER): Payer: Medicare Other | Admitting: Podiatry

## 2014-06-07 VITALS — BP 173/90 | HR 61 | Ht 69.0 in | Wt 144.0 lb

## 2014-06-07 DIAGNOSIS — M79606 Pain in leg, unspecified: Secondary | ICD-10-CM

## 2014-06-07 DIAGNOSIS — B351 Tinea unguium: Secondary | ICD-10-CM

## 2014-06-07 NOTE — Patient Instructions (Signed)
Seen for hypertrophic nails. All nails debrided. Return in 3 months or as needed.  

## 2014-06-07 NOTE — Progress Notes (Signed)
Subjective:  81 year old male presents requesting toe nails trimmed.  Left big toe hurts in shoes with thick toe nails.   Objective: Pedal pulses are not palpable.  Thick dystrophic nails x 10.  Very thick and enlarged right great toe nail with pain.  No new problems.   Assessment: Onychomycosis x 10.  Dystrophic nails both great toe nails.  Type II DM under control.   Plan: Palliation prn.  Debrided all nails x 10. Return in 3 months or as needed.

## 2014-06-24 ENCOUNTER — Telehealth: Payer: Self-pay | Admitting: Internal Medicine

## 2014-06-24 NOTE — Telephone Encounter (Signed)
Agree with plan.  Patient scheduled with Dr. Larose Kells.

## 2014-06-24 NOTE — Telephone Encounter (Signed)
[facility] - Client TELEPHONE ADVICE RECORD [facility] Patient Name: Dylan Orozco DOB: September 25, 1933 Initial Comment Caller states bp 168/98, home health nurse said he needed to contact dr Nurse Assessment Nurse: Donalynn Furlong, RN, Myna Hidalgo Date/Time Eilene Ghazi Time): 06/24/2014 11:36:17 AM Confirm and document reason for call. If symptomatic, describe symptoms. ---Caller states bp 168/98, home health nurse said he needed to contact dr Pt had taken blood pressure med within 30 min prior to blood pressure being taken by visiting nurse. No c/o headache, visual changes, numbness, tingling etc. Pt has some BLE edema this morning . "It's not that bad, just a little bit" Has the patient traveled out of the country within the last 30 days? ---No Does the patient require triage? ---Yes Related visit to physician within the last 2 weeks? ---No Does the PT have any chronic conditions? (i.e. diabetes, asthma, etc.) ---Yes List chronic conditions. ---high blood pressure, diabetes, Guidelines Guideline Title Affirmed Question Affirmed Notes High Blood Pressure BP # 160/100 Final Disposition User See PCP When Office is Open (within 3 days) Donalynn Furlong, RN, Myna Hidalgo Comments TC back to pt to update to 6/3 8:45 am appt with Dr Larose Kells

## 2014-06-25 ENCOUNTER — Encounter: Payer: Self-pay | Admitting: Internal Medicine

## 2014-06-25 ENCOUNTER — Other Ambulatory Visit: Payer: Self-pay

## 2014-06-25 ENCOUNTER — Ambulatory Visit (INDEPENDENT_AMBULATORY_CARE_PROVIDER_SITE_OTHER): Payer: Medicare Other | Admitting: Internal Medicine

## 2014-06-25 VITALS — BP 150/92 | HR 70 | Temp 98.1°F | Ht 69.0 in | Wt 149.2 lb

## 2014-06-25 DIAGNOSIS — R634 Abnormal weight loss: Secondary | ICD-10-CM | POA: Diagnosis not present

## 2014-06-25 DIAGNOSIS — I10 Essential (primary) hypertension: Secondary | ICD-10-CM | POA: Diagnosis not present

## 2014-06-25 MED ORDER — LOSARTAN POTASSIUM 50 MG PO TABS: 100.0000 mg | ORAL_TABLET | Freq: Every day | ORAL | Status: DC
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
## 2014-06-25 NOTE — Patient Instructions (Addendum)
Increase losartan 50 mg to 2 tablets daily  Check the  blood pressure 2 or 3 times a week Be sure your blood pressure is between 110/65 and  145/85.  if it is consistently higher or lower, let me know    Start Prilosec 20 mg one tablet daily

## 2014-06-25 NOTE — Progress Notes (Signed)
Pre visit review using our clinic review tool, if applicable. No additional management support is needed unless otherwise documented below in the visit note. 

## 2014-06-26 NOTE — Assessment & Plan Note (Signed)
BP elevated yesterday at home and today. Increase losartan to 100 mg

## 2014-06-26 NOTE — Assessment & Plan Note (Signed)
Weight loss Recently seen with weight loss, labs and x-rays were unremarkable. Today he reports cough with swallowing, hoarseness. No classic heartburn. Weight has increased but I am somewhat concerned about weight loss/hoarseness/cough when he swallows. Plan: Swallow study Refer to GI: EGD? Barium swallow? Prilosec OTC Follow-up one month

## 2014-06-28 ENCOUNTER — Encounter: Payer: Self-pay | Admitting: Gastroenterology

## 2014-06-30 ENCOUNTER — Other Ambulatory Visit (HOSPITAL_COMMUNITY): Payer: Self-pay | Admitting: Internal Medicine

## 2014-06-30 DIAGNOSIS — R1314 Dysphagia, pharyngoesophageal phase: Secondary | ICD-10-CM

## 2014-07-05 ENCOUNTER — Ambulatory Visit (HOSPITAL_COMMUNITY)
Admission: RE | Admit: 2014-07-05 | Discharge: 2014-07-05 | Disposition: A | Discharge status: Home / Self Care | Payer: Medicare Other | Source: Ambulatory Visit | Attending: Internal Medicine | Admitting: Internal Medicine

## 2014-07-05 DIAGNOSIS — R1310 Dysphagia, unspecified: Secondary | ICD-10-CM | POA: Insufficient documentation

## 2014-07-05 DIAGNOSIS — R1314 Dysphagia, pharyngoesophageal phase: Secondary | ICD-10-CM
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
## 2014-07-09 ENCOUNTER — Telehealth: Payer: Self-pay | Admitting: Internal Medicine

## 2014-07-09 DIAGNOSIS — R4702 Dysphasia: Secondary | ICD-10-CM

## 2014-07-09 NOTE — Telephone Encounter (Signed)
Advise patient, I review his swallow study, he has significant problems and needs to follow the recommendation for a mechanical soft diet per speech pathology. The problem seems to be chronic he does have some degree of aspiration. Also, it is unclear while he has difficulty swallowing, in addition to see GI I recommend neurology referral, please arrange, DX dysphasia

## 2014-07-09 NOTE — Telephone Encounter (Signed)
Spoke with Pt, informed him of barium swallow results. Informed him of Dr. Larose Kells recommendations regarding seeing a neurologist. Pt agreed, I informed him that I would place a referral to neurology and he should hear from them sometimes early next week to schedule an appt. Pt informed that next Tuesday he will be very busy because his daughter is having surgery, I informed him if he lets the scheduler know they can schedule around his daughter's surgery. Pt verbalized understanding.

## 2014-07-30 ENCOUNTER — Encounter: Payer: Self-pay | Admitting: Internal Medicine

## 2014-07-30 ENCOUNTER — Ambulatory Visit (INDEPENDENT_AMBULATORY_CARE_PROVIDER_SITE_OTHER): Payer: Medicare Other | Admitting: Internal Medicine

## 2014-07-30 VITALS — BP 126/76 | HR 62 | Temp 97.8°F | Ht 69.0 in | Wt 147.4 lb

## 2014-07-30 DIAGNOSIS — R634 Abnormal weight loss: Secondary | ICD-10-CM

## 2014-07-30 DIAGNOSIS — E119 Type 2 diabetes mellitus without complications: Secondary | ICD-10-CM | POA: Diagnosis not present

## 2014-07-30 DIAGNOSIS — I10 Essential (primary) hypertension: Secondary | ICD-10-CM

## 2014-07-30 LAB — BASIC METABOLIC PANEL
BUN: 20 mg/dL (ref 6–23)
CO2: 28 mEq/L (ref 19–32)
Calcium: 9.4 mg/dL (ref 8.4–10.5)
Chloride: 107 mEq/L (ref 96–112)
Creatinine, Ser: 0.98 mg/dL (ref 0.40–1.50)
GFR: 94.81 mL/min (ref >60.00)
Glucose, Bld: 179 mg/dL — ABNORMAL HIGH (ref 70–99)
Potassium: 3.7 mEq/L (ref 3.5–5.1)
Sodium: 142 mEq/L (ref 135–145)

## 2014-07-30 NOTE — Assessment & Plan Note (Signed)
Losartan dose increased to 100 mg last month, BPs are very good, check a BMP
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
## 2014-07-30 NOTE — Assessment & Plan Note (Signed)
Not taking metformin, reports his appetite has increased since then

## 2014-07-30 NOTE — Patient Instructions (Signed)
Get your blood work before you leave    

## 2014-07-30 NOTE — Assessment & Plan Note (Addendum)
Since the last time he was here, the swallow study showed significant problems, he has modify how he eats a he's not choking. GI and neurology eval (for dysphagia) pending. He reports his appetite has increased since we stopped metformin, still he has lost 2 pounds. Review of systems is positive for cough, last chest x-ray negative.

## 2014-07-30 NOTE — Progress Notes (Signed)
Pre visit review using our clinic review tool, if applicable. No additional management support is needed unless otherwise documented below in the visit note. 

## 2014-08-25 ENCOUNTER — Ambulatory Visit: Payer: Medicare Other | Admitting: Neurology

## 2014-08-25 ENCOUNTER — Encounter: Payer: Self-pay | Admitting: Gastroenterology

## 2014-08-25 ENCOUNTER — Encounter: Payer: Self-pay | Admitting: Neurology

## 2014-08-25 ENCOUNTER — Telehealth: Payer: Self-pay | Admitting: Neurology

## 2014-08-25 ENCOUNTER — Ambulatory Visit (INDEPENDENT_AMBULATORY_CARE_PROVIDER_SITE_OTHER): Payer: Medicare Other | Admitting: Gastroenterology

## 2014-08-25 ENCOUNTER — Other Ambulatory Visit (INDEPENDENT_AMBULATORY_CARE_PROVIDER_SITE_OTHER): Payer: Medicare Other

## 2014-08-25 VITALS — BP 170/90 | HR 71 | Ht 68.5 in | Wt 150.0 lb

## 2014-08-25 VITALS — BP 150/90 | HR 72 | Ht 68.5 in | Wt 146.2 lb

## 2014-08-25 DIAGNOSIS — R1314 Dysphagia, pharyngoesophageal phase: Secondary | ICD-10-CM | POA: Diagnosis not present

## 2014-08-25 DIAGNOSIS — H5442 Blindness, left eye, normal vision right eye: Secondary | ICD-10-CM

## 2014-08-25 DIAGNOSIS — G20C Parkinsonism, unspecified: Secondary | ICD-10-CM

## 2014-08-25 DIAGNOSIS — H02402 Unspecified ptosis of left eyelid: Secondary | ICD-10-CM

## 2014-08-25 DIAGNOSIS — R258 Other abnormal involuntary movements: Secondary | ICD-10-CM | POA: Diagnosis not present

## 2014-08-25 DIAGNOSIS — R059 Cough, unspecified: Secondary | ICD-10-CM

## 2014-08-25 DIAGNOSIS — H5440 Blindness, one eye, unspecified eye: Secondary | ICD-10-CM

## 2014-08-25 DIAGNOSIS — R292 Abnormal reflex: Secondary | ICD-10-CM

## 2014-08-25 DIAGNOSIS — R2689 Other abnormalities of gait and mobility: Secondary | ICD-10-CM

## 2014-08-25 DIAGNOSIS — Z79899 Other long term (current) drug therapy: Secondary | ICD-10-CM

## 2014-08-25 DIAGNOSIS — G20 Parkinson's disease: Secondary | ICD-10-CM

## 2014-08-25 DIAGNOSIS — R05 Cough: Secondary | ICD-10-CM | POA: Diagnosis not present

## 2014-08-25 DIAGNOSIS — R634 Abnormal weight loss: Secondary | ICD-10-CM | POA: Diagnosis not present

## 2014-08-25 LAB — VITAMIN B12: VITAMIN B 12: 1435 pg/mL — AB (ref 211–911)

## 2014-08-25 NOTE — Telephone Encounter (Signed)
Dylan Orozco from [place] needs prior authorization for an MRI he is having/Dawn

## 2014-08-25 NOTE — Patient Instructions (Addendum)
Start eating 3 full meals a day along with a Boost supplement in between those meals.   Today your blood pressure was elevated. Please follow up with your Primary Care Provider for blood pressure management.  Thank you for choosing me and [facility].  Pricilla Riffle. Dagoberto Ligas., MD., Marval Regal
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
## 2014-08-25 NOTE — Patient Instructions (Signed)
1. MRI brain wo contrast 2. Check blood work 3. Return to clinic in 4 weeks

## 2014-08-25 NOTE — Progress Notes (Signed)
    History of Present Illness: This is a 81 year old male referred by Dylan Branch, MD for the evaluation of weight loss, decreased appetite, dysphagia, cough. His symptoms have been worsening over the course of several months reviewing the chart he has had a gradual 20 pound weight loss in June 2015. Patient feels metformin may have contributed to his weight loss and poor appetite and since discontinuing the medication his appetite has improved and he states she's gained 2 pounds. He does note frequent coughing and choking. Modified barium swallow study was performed which showed Moderate pharyngeal phase dysphagia; Moderate cervical esophageal phase dysphagia. His last colonoscopy was in 2013 showing only small Dylan polyps. States he took Nexium for several weeks without any change in symptoms. Denies abdominal pain, constipation, diarrhea, change in stool caliber, melena, hematochezia, nausea, vomiting, reflux symptoms, chest pain.   Review of Systems: Pertinent positive and negative review of systems were noted in the above HPI section. All other review of systems were otherwise negative.  Current Medications, Allergies, Past Medical History, Past Surgical History, Family History and Social History were reviewed in [employer] record.  Physical Exam: General: Well developed, well nourished, no acute distress Head: Normocephalic and atraumatic Eyes:  sclerae anicteric, EOMI Ears: Normal auditory acuity Mouth: No deformity or lesions Neck: Supple, no masses or thyromegaly Lungs: Clear throughout to auscultation Heart: Regular rate and rhythm; no murmurs, rubs or bruits Abdomen: Soft, non tender and non distended. No masses, hepatosplenomegaly or hernias noted. Normal Bowel sounds Musculoskeletal: Symmetrical with no gross deformities  Skin: No lesions on visible extremities Pulses:  Normal pulses noted Extremities: No clubbing, cyanosis, edema or deformities  noted Neurological: Alert oriented x 4, grossly nonfocal. Shuffling gait.  Cervical Nodes:  No significant cervical adenopathy Inguinal Nodes: No significant inguinal adenopathy Psychological:  Alert and cooperative. Normal mood and affect  Assessment and Recommendations:  1. Weight loss, decreased appetite, pharyngeal dysphagia, cough. A component of his symptoms might be a metformin side effect. I'm concerned about an underlying neurologic disorder leading to his swallowing dysfunction and other symptoms. Possibly Parkinson's disease. He is advised to eat 3 full meals a day with Boost or similar nutritional supplement tid in between meals. Advised to follow-up with Dr. Larose Kells for further evaluation. If he fails to gain weight and no clear cause for his symptoms is uncovered consider further evaluation with abdominal pelvic CT and EGD.  cc: Dylan Branch, MD [address] [place], [place] [address]

## 2014-08-27 ENCOUNTER — Ambulatory Visit: Payer: Medicare Other | Admitting: Neurology

## 2014-08-27 NOTE — Telephone Encounter (Signed)
PA done.  Faxed Jennifer #.

## 2014-08-28 ENCOUNTER — Ambulatory Visit
Admission: RE | Admit: 2014-08-28 | Discharge: 2014-08-28 | Disposition: A | Discharge status: Home / Self Care | Payer: Medicare Other | Source: Ambulatory Visit | Attending: Neurology | Admitting: Neurology

## 2014-08-28 DIAGNOSIS — R258 Other abnormal involuntary movements: Secondary | ICD-10-CM

## 2014-08-28 DIAGNOSIS — R2689 Other abnormalities of gait and mobility: Secondary | ICD-10-CM

## 2014-08-28 DIAGNOSIS — G20 Parkinson's disease: Secondary | ICD-10-CM

## 2014-08-28 DIAGNOSIS — H5440 Blindness, one eye, unspecified eye: Secondary | ICD-10-CM

## 2014-08-28 DIAGNOSIS — R292 Abnormal reflex: Secondary | ICD-10-CM

## 2014-08-28 DIAGNOSIS — R1314 Dysphagia, pharyngoesophageal phase: Secondary | ICD-10-CM

## 2014-08-28 LAB — ZINC: Zinc: 68 ug/dL (ref 60–130)

## 2014-08-28 LAB — COPPER, SERUM: Copper: 110 ug/dL (ref 70–175)

## 2014-08-31 ENCOUNTER — Other Ambulatory Visit: Payer: Self-pay | Admitting: Internal Medicine

## 2014-09-08 ENCOUNTER — Telehealth: Payer: Self-pay | Admitting: Internal Medicine

## 2014-09-08 NOTE — Telephone Encounter (Signed)
pre visit letter mailed 09/02/14 °

## 2014-09-15 ENCOUNTER — Ambulatory Visit (INDEPENDENT_AMBULATORY_CARE_PROVIDER_SITE_OTHER): Payer: Medicare Other | Admitting: Neurology

## 2014-09-15 ENCOUNTER — Encounter: Payer: Self-pay | Admitting: Neurology

## 2014-09-15 VITALS — BP 136/84 | HR 73 | Wt 151.4 lb

## 2014-09-15 DIAGNOSIS — G20 Parkinson's disease: Secondary | ICD-10-CM | POA: Diagnosis not present

## 2014-09-15 DIAGNOSIS — G3184 Mild cognitive impairment, so stated: Secondary | ICD-10-CM | POA: Diagnosis not present

## 2014-09-15 DIAGNOSIS — R2689 Other abnormalities of gait and mobility: Secondary | ICD-10-CM | POA: Diagnosis not present

## 2014-09-15 MED ORDER — CARBIDOPA-LEVODOPA 25-100 MG PO TABS: ORAL_TABLET | ORAL | Status: DC

## 2014-09-15 NOTE — Patient Instructions (Addendum)
1.  Start Carbidopa Levodopa as follows:   AM   Afternoon   PM Week 1 0.5 tab  0.5 tab   0.5 tab Week 2 0.5 tab  0.5 tab   1 tab Week 3 0.5 tab  1 tab   1 tab Week 4 1 tab  1 tab   1 tab  Take before meals and avoid taking with any protein products.   2.  Please call my office if you would like to start therapy for your walking  3.  Encouraged to stay active  4.  Return to clinic in 2 months

## 2014-09-15 NOTE — Progress Notes (Signed)
Follow-up Visit   Date: 09/15/2014    Dylan Orozco MRN: [id number] DOB: [birth year]   Interim History: Dylan Orozco is a 81 y.o. right-handed African American male with hypertension, diabetes mellitus type II, glaucoma with left eye blindness, history or prostate cancer, hyperlipidemia returning to the clinic for follow-up of parkinsonism.  The patient was accompanied to the clinic by wife who also provides collateral information.    History of present illness: Starting since June 2015, patient has had 20lb unintentional weight loss which he initially attributed to metformin causing reduced appetite and weight loss. Since stopping this medication, he has gained 2lb. He also complains of frequent choking spells with solids, moreso than liquids. He has noticed that his voice has becomes slower and lower. His movements are slower. Denies problems with sense of smell, vivid dreams or constipation. He feels that he is stooped when walking. His hand writing has become much smaller and not as neat as it previously was. Denies any tremor.   Walking is much slower and he is shuffling his feet. Balance and stability is also a problem, but fortunately he has not had any falls and continues to walk independently. No numbness/tingling of the feet.   He also complains of mild memory problems and word finding difficulty. He is managing his own finances. He does not drive very much.   He underwent modified barium swallow which showed moderate pharyngreal and esophageal dysphagia. He was evaluated by Dr. Fuller Plan today for the same complaints who agreed with neurology consultation recommended by his PCP for dysphagia.  UPDATE 09/15/2014:  Overall, he feels better because his appetite has increased and he has not lost any more weight.  He denies any problems with swallowing, but complains of increased sneezing, mucus, and sore throat.  No more choking spells.  Denies any falls.    He has  noticed problems with his short-term memory such that he can forget things.  He mostly has word-finding difficulties, especially with names.  He manages his own finances without difficulty. He is not forgetting appointments or missing any bills, in fact, he reports having FICO of 800.  Driving has been significant limited because of his vision, but he does not report getting lost.   Medications:  Current Outpatient Prescriptions on File Prior to Visit  Medication Sig Dispense Refill  . AcetaZOLAMIDE CRYS Place 1 drop into both eyes 2 (two) times daily. 1 drop in each eye in morning and 1 drop in each eye in evening    . ALPHAGAN P 0.1 % SOLN Place 1 drop into the right eye 2 (two) times daily.    Marland Kitchen aspirin 81 MG tablet Take 81 mg by mouth daily.    Marland Kitchen atorvastatin (LIPITOR) 20 MG tablet Take 1 tablet (20 mg total) by mouth daily. 30 tablet 6  . fish oil-omega-3 fatty acids 1000 MG capsule Take 1 g by mouth daily.    Marland Kitchen losartan (COZAAR) 50 MG tablet Take 2 tablets (100 mg total) by mouth daily. 60 tablet 3  . Multiple Vitamin (MULTIVITAMIN WITH MINERALS) TABS Take 1 tablet by mouth daily.     No current facility-administered medications on file prior to visit.    Allergies:  Allergies  Allergen Reactions  . Glimepiride     REACTION: ? UPSET STOMACH  . Pravastatin     Constipation  . Ramipril      dizziness    Review of Systems:  CONSTITUTIONAL: No fevers, chills, night sweats,  or weight loss.  EYES: No visual changes or eye pain ENT: No hearing changes.  No history of nose bleeds.   RESPIRATORY: No cough, wheezing and shortness of breath.   CARDIOVASCULAR: Negative for chest pain, and palpitations.   GI: Negative for abdominal discomfort, blood in stools or black stools.  No recent change in bowel habits.   GU:  No history of incontinence.   MUSCLOSKELETAL: No history of joint pain or swelling.  No myalgias.   SKIN: Negative for lesions, rash, and itching.   ENDOCRINE: Negative  for cold or heat intolerance, polydipsia or goiter.   PSYCH:  No depression or anxiety symptoms.   NEURO: As Above.   Vital Signs:  BP 136/84 mmHg  Pulse 73  Wt 151 lb 6 oz (68.663 kg)  SpO2 98%  Neurological Exam: MENTAL STATUS:  Awake, alert, blunted affect.  Oriented to person, place, and time.  Speech hypophonic and slow, no dysarthria.  CRANIAL NERVES:  Left eye is blind with mild ptosis.  Poor blink.  Pupils equal round and reactive to light.  Normal conjugate, extra-ocular eye movements in all directions of gaze.  Face is symmetric. Palate elevates symmetrically.  Tongue is midline.  Pathological facial reflexes present.  MOTOR:  Generalized loss of muscle bulk.  Motor strength is 5/5 in all extremities.  There is mild rigidity of the LUE.     MSRs:  Reflexes are 2+/4 throughout, except 1+/4 bilateral Achilles  COORDINATION/GAIT: Finger and heel tapping is slowed throughout, worse on the left. Able to rise from a chair without using arms. Gait is mildly stooped, small shuffling steps and turns with 6 steps.  Data: MRI brain wo contrast 08/28/2014:  No acute intracranial findings. Age-related atrophy with mild to moderate small vessel disease.  Labs 08/25/2014:  Vitamin B12 1435, copper 110, zinc 68  IMPRESSION/PLAN: 1.  Parkinsonism, predominately akinetic-rigid features (LUE rigidity, bradykinesia, hypophonia, shuffling gait). No tremors on exam, gaze preserved.  - MRI brain reviewed and shows age-related changes  - Strongly encouraged started exercise program  - PT declined by patient  - Start sinemet as follows: 1/2 tab three times a day before meals x 1 wk, then 1/2 in am & noon & 1 in evening for a week, then 1/2 in am &1 at noon &one in evening for a week, then 1 tablet three times a day before meals.  Common side effects discussed  2.  Mild cognitive impairment, MOCA 25/30  - Functionally independent  - Encouraged mentally stimulating activies and  staying active  - No need for medications at this time  Return to clinic in 2-3 months   The duration of this appointment visit was 35 minutes of face-to-face time with the patient.  Greater than 50% of this time was spent in counseling, explanation of diagnosis, planning of further management, and coordination of care.   Thank you for allowing me to participate in patient's care.  If I can answer any additional questions, I would be pleased to do so.    Sincerely,    Bralynn Donado K. Posey Pronto, DO

## 2014-09-22 ENCOUNTER — Telehealth: Payer: Self-pay

## 2014-09-22 NOTE — Telephone Encounter (Signed)
Pre visit call completed 

## 2014-09-23 ENCOUNTER — Encounter: Payer: Self-pay | Admitting: Internal Medicine

## 2014-09-23 ENCOUNTER — Ambulatory Visit (INDEPENDENT_AMBULATORY_CARE_PROVIDER_SITE_OTHER): Payer: Medicare Other | Admitting: Internal Medicine

## 2014-09-23 VITALS — BP 136/78 | HR 73 | Temp 97.7°F | Ht 69.0 in | Wt 150.0 lb

## 2014-09-23 DIAGNOSIS — Z0000 Encounter for general adult medical examination without abnormal findings: Secondary | ICD-10-CM

## 2014-09-23 DIAGNOSIS — Z09 Encounter for follow-up examination after completed treatment for conditions other than malignant neoplasm: Secondary | ICD-10-CM | POA: Insufficient documentation

## 2014-09-23 DIAGNOSIS — R634 Abnormal weight loss: Secondary | ICD-10-CM

## 2014-09-23 DIAGNOSIS — E119 Type 2 diabetes mellitus without complications: Secondary | ICD-10-CM | POA: Diagnosis not present

## 2014-09-23 DIAGNOSIS — Z23 Encounter for immunization: Secondary | ICD-10-CM | POA: Diagnosis not present

## 2014-09-23 DIAGNOSIS — E782 Mixed hyperlipidemia: Secondary | ICD-10-CM

## 2014-09-23 DIAGNOSIS — E785 Hyperlipidemia, unspecified: Secondary | ICD-10-CM

## 2014-09-23 LAB — LIPID PANEL
CHOL/HDL RATIO: 2
Cholesterol: 158 mg/dL (ref 0–200)
HDL: 65.5 mg/dL (ref >39.00)
LDL CALC: 83 mg/dL (ref 0–99)
NONHDL: 92.82
Triglycerides: 50 mg/dL (ref 0.0–149.0)
VLDL: 10 mg/dL (ref 0.0–40.0)

## 2014-09-23 LAB — HEMOGLOBIN A1C: Hgb A1c MFr Bld: 7.2 % — ABNORMAL HIGH (ref 4.6–6.5)

## 2014-09-23 LAB — ALT: ALT: 32 U/L (ref 0–53)

## 2014-09-23 LAB — AST: AST: 29 U/L (ref 0–37)

## 2014-09-23 MED ORDER — ZOSTER VACCINE LIVE 19400 UNT/0.65ML ~~LOC~~ SOLR: 0.6500 mL | Freq: Once | SUBCUTANEOUS | Status: DC
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
## 2014-09-23 NOTE — Assessment & Plan Note (Signed)
Hypertension: Seems well-controlled, continue with losartan, last BMP satisfactory Diabetes: Check A1c Hyperlipidemia: Good compliance of medication, check FLP, LFTs. Weight loss: Resolving. Dysphagia: Still having issues, patient reports is better, reinforced the need to follow the speech pathologist recommendations for eating.

## 2014-09-23 NOTE — Assessment & Plan Note (Signed)
Td 09-2014 ;pnm 23--2014; prevnar--2015; zostavax--rx provided  Flu shot recommended  CCS: Last Cscope 02-2011, 2 polyps, next 2018 PSAs per Dr Janice Norrie Labs Diet-exercise discussed  Palpable Ao--- [place] 9-15 neg AAA

## 2014-09-23 NOTE — Patient Instructions (Signed)
Get your blood work before you leave   Next visit in 6 months for a routine checkup, 30 minutes, not fasting    Fall Prevention and [place] cause injuries and can affect all age groups. It is possible to use preventive measures to significantly decrease the likelihood of falls. There are many simple measures which can make your home safer and prevent falls. OUTDOORS  Repair cracks and edges of walkways and driveways.  Remove high doorway thresholds.  Trim shrubbery on the main path into your home.  Have good outside lighting.  Clear walkways of tools, rocks, debris, and clutter.  Check that handrails are not broken and are securely fastened. Both sides of steps should have handrails.  Have leaves, snow, and ice cleared regularly.  Use sand or salt on walkways during winter months.  In the garage, clean up grease or oil spills. BATHROOM  Install night lights.  Install grab bars by the toilet and in the tub and shower.  Use non-skid mats or decals in the tub or shower.  Place a plastic non-slip stool in the shower to sit on, if needed.  Keep floors dry and clean up all water on the floor immediately.  Remove soap buildup in the tub or shower on a regular basis.  Secure bath mats with non-slip, double-sided rug tape.  Remove throw rugs and tripping hazards from the floors. BEDROOMS  Install night lights.  Make sure a bedside light is easy to reach.  Do not use oversized bedding.  Keep a telephone by your bedside.  Have a firm chair with side arms to use for getting dressed.  Remove throw rugs and tripping hazards from the floor. KITCHEN  Keep handles on pots and pans turned toward the center of the stove. Use back burners when possible.  Clean up spills quickly and allow time for drying.  Avoid walking on wet floors.  Avoid hot utensils and knives.  Position shelves so they are not too high or low.  Place commonly used objects within easy  reach.  If necessary, use a sturdy step stool with a grab bar when reaching.  Keep electrical cables out of the way.  Do not use floor polish or wax that makes floors slippery. If you must use wax, use non-skid floor wax.  Remove throw rugs and tripping hazards from the floor. STAIRWAYS  Never leave objects on stairs.  Place handrails on both sides of stairways and use them. Fix any loose handrails. Make sure handrails on both sides of the stairways are as long as the stairs.  Check carpeting to make sure it is firmly attached along stairs. Make repairs to worn or loose carpet promptly.  Avoid placing throw rugs at the top or bottom of stairways, or properly secure the rug with carpet tape to prevent slippage. Get rid of throw rugs, if possible.  Have an electrician put in a light switch at the top and bottom of the stairs. OTHER FALL PREVENTION TIPS  Wear low-heel or rubber-soled shoes that are supportive and fit well. Wear closed toe shoes.  When using a stepladder, make sure it is fully opened and both spreaders are firmly locked. Do not climb a closed stepladder.  Add color or contrast paint or tape to grab bars and handrails in your home. Place contrasting color strips on first and last steps.  Learn and use mobility aids as needed. Install an electrical emergency response system.  Turn on lights to avoid dark areas.  Replace light bulbs that burn out immediately. Get light switches that glow.  Arrange furniture to create clear pathways. Keep furniture in the same place.  Firmly attach carpet with non-skid or double-sided tape.  Eliminate uneven floor surfaces.  Select a carpet pattern that does not visually hide the edge of steps.  Be aware of all pets. OTHER HOME SAFETY TIPS  Set the water temperature for 120 F (48.8 C).  Keep emergency numbers on or near the telephone.  Keep smoke detectors on every level of the home and near sleeping areas. Document Released:  12/29/2001 Document Revised: 07/10/2011 Document Reviewed: 03/30/2011 [facility] Patient Information 2015 [place], [place]. This information is not intended to replace advice given to you by your health care provider. Make sure you discuss any questions you have with your health care provider.   Preventive Care for Adults Ages 10 and over  Blood pressure check.** / Every 1 to 2 years.  Lipid and cholesterol check.**/ Every 5 years beginning at age 19.  Lung cancer screening. / Every year if you are aged 69-80 years and have a 30-pack-year history of smoking and currently smoke or have quit within the past 15 years. Yearly screening is stopped once you have quit smoking for at least 15 years or develop a health problem that would prevent you from having lung cancer treatment.  Fecal occult blood test (FOBT) of stool. / Every year beginning at age 84 and continuing until age 54. You may not have to do this test if you get a colonoscopy every 10 years.  Flexible sigmoidoscopy** or colonoscopy.** / Every 5 years for a flexible sigmoidoscopy or every 10 years for a colonoscopy beginning at age 57 and continuing until age 11.  Hepatitis C blood test.** / For all people born from [birth year] through 1963 and any individual with known risks for hepatitis C.  Abdominal aortic aneurysm (AAA) screening.** / A one-time screening for ages 41 to 79 years who are current or former smokers.  Skin self-exam. / Monthly.  Influenza vaccine. / Every year.  Tetanus, diphtheria, and acellular pertussis (Tdap/Td) vaccine.** / 1 dose of Td every 10 years.  Varicella vaccine.** / Consult your health care provider.  Zoster vaccine.** / 1 dose for adults aged 76 years or older.  Pneumococcal 13-valent conjugate (PCV13) vaccine.** / Consult your health care provider.  Pneumococcal polysaccharide (PPSV23) vaccine.** / 1 dose for all adults aged 21 years and older.  Meningococcal vaccine.** / Consult your health care  provider.  Hepatitis A vaccine.** / Consult your health care provider.  Hepatitis B vaccine.** / Consult your health care provider.  Haemophilus influenzae type b (Hib) vaccine.** / Consult your health care provider. **Family history and personal history of risk and conditions may change your health care provider's recommendations. Document Released: 03/06/2001 Document Revised: 01/13/2013 Document Reviewed: 06/05/2010 [facility] Patient Information 2015 [place], [place]. This information is not intended to replace advice given to you by your health care provider. Make sure you discuss any questions you have with your health care provider.

## 2014-09-23 NOTE — Progress Notes (Signed)
Pre visit review using our clinic review tool, if applicable. No additional management support is needed unless otherwise documented below in the visit note. 

## 2014-09-29 MED ORDER — METFORMIN HCL 850 MG PO TABS: 850.0000 mg | ORAL_TABLET | Freq: Two times a day (BID) | ORAL | Status: DC

## 2014-09-29 NOTE — Addendum Note (Signed)
Addended by: Wilfrid Lund on: 09/29/2014 08:21 AM   Modules accepted: Orders

## 2014-10-21 ENCOUNTER — Telehealth: Payer: Self-pay | Admitting: Neurology

## 2014-10-21 NOTE — Telephone Encounter (Signed)
Pt called to see if he should get a refill of med/ cabidopulevodopa//[phone]

## 2014-10-22 NOTE — Telephone Encounter (Signed)
Informed patient that he may continue to take medicine even though he has glaucoma per Dr. Posey Pronto.

## 2014-12-09 ENCOUNTER — Encounter: Payer: Self-pay | Admitting: Internal Medicine

## 2014-12-23 ENCOUNTER — Ambulatory Visit (INDEPENDENT_AMBULATORY_CARE_PROVIDER_SITE_OTHER): Payer: Medicare Other | Admitting: Neurology

## 2014-12-23 ENCOUNTER — Encounter: Payer: Self-pay | Admitting: Neurology

## 2014-12-23 ENCOUNTER — Other Ambulatory Visit: Payer: Self-pay | Admitting: Internal Medicine

## 2014-12-23 VITALS — BP 140/84 | HR 93 | Ht 69.0 in | Wt 148.1 lb

## 2014-12-23 DIAGNOSIS — G20 Parkinson's disease: Secondary | ICD-10-CM

## 2014-12-23 DIAGNOSIS — G3184 Mild cognitive impairment, so stated: Secondary | ICD-10-CM | POA: Diagnosis not present

## 2014-12-23 DIAGNOSIS — G20A1 Parkinson's disease without dyskinesia, without mention of fluctuations: Secondary | ICD-10-CM

## 2014-12-23 MED ORDER — CARBIDOPA-LEVODOPA 25-100 MG PO TABS: 1.0000 | ORAL_TABLET | Freq: Three times a day (TID) | ORAL | Status: DC

## 2014-12-23 NOTE — Progress Notes (Signed)
Follow-up Visit   Date: 12/23/2014    Dylan Orozco MRN: [id number] DOB: May 13, 1933   Interim History: Dylan Orozco is a 81 y.o. right-handed African American male with hypertension, diabetes mellitus type II, glaucoma with left eye blindness, history or prostate cancer, hyperlipidemia returning to the clinic for follow-up of parkinsonism.  The patient was accompanied to the clinic by wife who also provides collateral information.    History of present illness: Starting since June 2015, patient has had 20lb unintentional weight loss which he initially attributed to metformin causing reduced appetite and weight loss. Since stopping this medication, he has gained 2lb. He also complains of frequent choking spells with solids, moreso than liquids. He has noticed that his voice has becomes slower and lower. His movements are slower. Denies problems with sense of smell, vivid dreams or constipation. He feels that he is stooped when walking. His hand writing has become much smaller and not as neat as it previously was. Denies any tremor.   Walking is much slower and he is shuffling his feet. Balance and stability is also a problem, but fortunately he has not had any falls and continues to walk independently. No numbness/tingling of the feet.   He also complains of mild memory problems and word finding difficulty. He is managing his own finances. He does not drive very much.   He underwent modified barium swallow which showed moderate pharyngreal and esophageal dysphagia. He was evaluated by Dr. Fuller Plan today for the same complaints who agreed with neurology consultation recommended by his PCP for dysphagia.  UPDATE 09/15/2014:  Overall, he feels better because his appetite has increased and he has not lost any more weight.  He denies any problems with swallowing, but complains of increased sneezing, mucus, and sore throat.  He has noticed problems with his short-term memory  such that he can forget things.  He mostly has word-finding difficulties, especially with names.  He manages his own finances without difficulty. He is not forgetting appointments or missing any bills, in fact, he reports having FICO of 800.  Driving has been significant limited because of his vision, but he does not report getting lost.  UPDATE 12/23/2014:  He has noticed marked improvement in generalized stiffness and walking since started sinemet.  He takes 1 tablet three times daily (9am, 3pm, 7pm). He denies noticing any wearing off.  Memory is stable and unchanged.  No interval falls.  He no longer has any choking spells or problems with swallowing.  His wife unfortunately had a heart attack a month ago, but is recovering well.   Medications:  Current Outpatient Prescriptions on File Prior to Visit  Medication Sig Dispense Refill  . AcetaZOLAMIDE CRYS Place 1 drop into both eyes 2 (two) times daily. 1 drop in each eye in morning and 1 drop in each eye in evening    . ALPHAGAN P 0.1 % SOLN Place 1 drop into the right eye 2 (two) times daily.    Marland Kitchen aspirin 81 MG tablet Take 81 mg by mouth daily.    . dorzolamide-timolol (COSOPT) 22.3-6.8 MG/ML ophthalmic solution PLACE 1 DROP INTO THE RIGHT EYE 2 TIMES DAILY.  11  . fish oil-omega-3 fatty acids 1000 MG capsule Take 1 g by mouth daily.    Marland Kitchen losartan (COZAAR) 50 MG tablet Take 2 tablets (100 mg total) by mouth daily. 60 tablet 3  . metFORMIN (GLUCOPHAGE) 850 MG tablet Take 1 tablet (850 mg total) by mouth  2 (two) times daily with a meal. For the first 10 days take only 1 tablet daily to prevent GI side effects. 60 tablet 3  . Multiple Vitamin (MULTIVITAMIN WITH MINERALS) TABS Take 1 tablet by mouth daily.    Marland Kitchen zoster vaccine live, PF, (ZOSTAVAX) 16109 UNT/0.65ML injection Inject 19,400 Units into the skin once. 1 each 0   No current facility-administered medications on file prior to visit.    Allergies:  Allergies  Allergen Reactions  .  Glimepiride     REACTION: ? UPSET STOMACH  . Pravastatin     Constipation  . Ramipril      dizziness    Review of Systems:  CONSTITUTIONAL: No fevers, chills, night sweats, or weight loss.  EYES: No visual changes or eye pain ENT: No hearing changes.  No history of nose bleeds.   RESPIRATORY: No cough, wheezing and shortness of breath.   CARDIOVASCULAR: Negative for chest pain, and palpitations.   GI: Negative for abdominal discomfort, blood in stools or black stools.  No recent change in bowel habits.   GU:  No history of incontinence.   MUSCLOSKELETAL: No history of joint pain or swelling.  No myalgias.   SKIN: Negative for lesions, rash, and itching.   ENDOCRINE: Negative for cold or heat intolerance, polydipsia or goiter.   PSYCH:  No depression or anxiety symptoms.   NEURO: As Above.   Vital Signs:  BP 140/84 mmHg  Pulse 93  Ht 5\' 9"  (1.753 m)  Wt 148 lb 1 oz (67.161 kg)  BMI 21.86 kg/m2  SpO2 99%  Neurological Exam: MENTAL STATUS:  Awake, alert, blunted affect.  Oriented to person, place, and time.  Speech mildly hypophonic and slow (improved), no dysarthria.  CRANIAL NERVES:  Left eye is blind with mild ptosis.  Poor blink.  Pupils equal round and reactive to light.  Normal conjugate, extra-ocular eye movements in all directions of gaze.  Face is symmetric. Palate elevates symmetrically.  Tongue is midline.  Pathological facial reflexes present.  MOTOR:  Motor strength is 5/5 in all extremities.  There is mild rigidity of the LUE.     MSRs:  Reflexes are 2+/4 throughout, except 1+/4 bilateral Achilles  COORDINATION/GAIT: Finger and heel tapping is slowed throughout, worse on the left. Able to rise from a chair without using arms. Gait is mildly stooped, marked improvement in gait with normal cadence and turning with 3 steps, only intermittent shuffling.  Data: MRI brain wo contrast 08/28/2014:  No acute intracranial findings. Age-related atrophy with mild to  moderate small vessel disease.  Labs 08/25/2014:  Vitamin B12 1435, copper 110, zinc 68  IMPRESSION/PLAN: 1.  Parkinsonism, predominately akinetic-rigid features (LUE rigidity, bradykinesia, hypophonia, shuffling gait). No tremors on exam, gaze preserved. Gait is improved with sinemet.  - MRI brain shows age-related changes  - Continue sinemet 25/100 1 tablet three times daily - refills provided for a year  - Encouraged to stay active and continue his exercises   2.  Mild cognitive impairment, MOCA 25/30  - Functionally independent  - Encouraged mentally stimulating activies and staying active  - No need for medications at this time  Return to clinic in 6 months   The duration of this appointment visit was 20 minutes of face-to-face time with the patient.  Greater than 50% of this time was spent in counseling, explanation of diagnosis, planning of further management, and coordination of care.   Thank you for allowing me to participate in patient's care.  If I can answer any additional questions, I would be pleased to do so.    Sincerely,    Yukio Bisping K. Posey Pronto, DO

## 2014-12-23 NOTE — Patient Instructions (Signed)
Great to see you today, you're walking is much improved! Continue your medications as you are taking Return to clinic in 6 months

## 2014-12-26 ENCOUNTER — Other Ambulatory Visit: Payer: Self-pay | Admitting: Internal Medicine

## 2015-01-12 ENCOUNTER — Ambulatory Visit (INDEPENDENT_AMBULATORY_CARE_PROVIDER_SITE_OTHER): Payer: Medicare Other | Admitting: Podiatry

## 2015-01-12 ENCOUNTER — Encounter: Payer: Self-pay | Admitting: Podiatry

## 2015-01-12 DIAGNOSIS — B351 Tinea unguium: Secondary | ICD-10-CM | POA: Diagnosis not present

## 2015-01-12 DIAGNOSIS — M79606 Pain in leg, unspecified: Secondary | ICD-10-CM

## 2015-01-12 NOTE — Patient Instructions (Signed)
Seen for hypertrophic nails. All nails debrided. Return in 3 months or as needed.  

## 2015-01-12 NOTE — Progress Notes (Signed)
Subjective:  81 year old male presents requesting toe nails trimmed.  Left big toe hurts in shoes with thick toe nails.   Objective: Pedal pulses are not palpable.  Thick dystrophic nails x 10.  Very thick and enlarged right great toe nail with pain.  No new problems.   Assessment: Onychomycosis x 10.  Dystrophic nails both great toe nails.  Type II DM under control.   Plan: Palliation prn.  Debrided all nails x 10. Return in 3 months or as needed. 

## 2015-03-23 ENCOUNTER — Other Ambulatory Visit: Payer: Self-pay

## 2015-03-24 ENCOUNTER — Encounter: Payer: Self-pay | Admitting: Internal Medicine

## 2015-03-24 ENCOUNTER — Ambulatory Visit (INDEPENDENT_AMBULATORY_CARE_PROVIDER_SITE_OTHER): Payer: Medicare Other | Admitting: Internal Medicine

## 2015-03-24 VITALS — BP 132/74 | HR 68 | Temp 97.5°F | Ht 69.0 in | Wt 152.0 lb

## 2015-03-24 DIAGNOSIS — I10 Essential (primary) hypertension: Secondary | ICD-10-CM

## 2015-03-24 DIAGNOSIS — E119 Type 2 diabetes mellitus without complications: Secondary | ICD-10-CM

## 2015-03-24 DIAGNOSIS — Z09 Encounter for follow-up examination after completed treatment for conditions other than malignant neoplasm: Secondary | ICD-10-CM

## 2015-03-24 LAB — BASIC METABOLIC PANEL
BUN: 22 mg/dL (ref 6–23)
CALCIUM: 9.6 mg/dL (ref 8.4–10.5)
CO2: 28 mEq/L (ref 19–32)
CREATININE: 1.08 mg/dL (ref 0.40–1.50)
Chloride: 104 mEq/L (ref 96–112)
GFR: 84.61 mL/min (ref >60.00)
GLUCOSE: 194 mg/dL — AB (ref 70–99)
Potassium: 4.2 mEq/L (ref 3.5–5.1)
SODIUM: 139 meq/L (ref 135–145)

## 2015-03-24 LAB — HEMOGLOBIN A1C: Hgb A1c MFr Bld: 7 % — ABNORMAL HIGH (ref 4.6–6.5)
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
## 2015-03-24 NOTE — Progress Notes (Signed)
Pre visit review using our clinic review tool, if applicable. No additional management support is needed unless otherwise documented below in the visit note. 

## 2015-03-24 NOTE — Patient Instructions (Signed)
GO TO THE LAB : Get the blood work    GO TO THE FRONT DESK  Schedule a routine office visit or check up to be done in  3 months  No fasting   Front desk:  15

## 2015-03-25 NOTE — Assessment & Plan Note (Signed)
DM: Developed diarrhea after 3 doses of metformin, I recommend to try again at a lower dose, he is quite reluctant to do so. Will check A1c and Rx  Tradjenta or similar medication if needed. Doing well with diet and exercise . HTN: On losartan 100 mg daily, check a BMP Dysphagia: States is doing great, no major problems at this point. RTC 3 months

## 2015-03-28 MED ORDER — LINAGLIPTIN 5 MG PO TABS: 5.0000 mg | ORAL_TABLET | Freq: Every day | ORAL | Status: DC

## 2015-03-28 NOTE — Addendum Note (Signed)
Addended by: Tasia Catchings on: 03/28/2015 02:55 PM   Modules accepted: Orders, Medications

## 2015-05-26 ENCOUNTER — Other Ambulatory Visit: Payer: Self-pay | Admitting: Internal Medicine

## 2015-05-30 ENCOUNTER — Other Ambulatory Visit: Payer: Self-pay | Admitting: Internal Medicine

## 2015-06-24 ENCOUNTER — Ambulatory Visit: Payer: Medicare Other | Admitting: Neurology

## 2015-07-07 ENCOUNTER — Ambulatory Visit (INDEPENDENT_AMBULATORY_CARE_PROVIDER_SITE_OTHER): Payer: Medicare Other | Admitting: Neurology

## 2015-07-07 ENCOUNTER — Encounter: Payer: Self-pay | Admitting: Neurology

## 2015-07-07 VITALS — BP 104/70 | HR 76 | Ht 69.0 in | Wt 151.6 lb

## 2015-07-07 DIAGNOSIS — G20 Parkinson's disease: Secondary | ICD-10-CM

## 2015-07-07 NOTE — Patient Instructions (Signed)
Return to clinic in 6 months.

## 2015-07-07 NOTE — Progress Notes (Signed)
Follow-up Visit   Date: 07/07/2015    Dylan Orozco MRN: [id number] DOB: November 30, 1933   Interim History: Dylan Orozco is a 82 y.o. right-handed African American male with hypertension, diabetes mellitus type II, glaucoma with left eye blindness, history or prostate cancer, hyperlipidemia returning to the clinic for follow-up of parkinsonism.  The patient was accompanied to the clinic by wife who also provides collateral information.    History of present illness: Starting since June 2015, patient has had 20lb unintentional weight loss which he initially attributed to metformin causing reduced appetite and weight loss. Since stopping this medication, he has gained 2lb. He also complains of frequent choking spells with solids, moreso than liquids. He has noticed that his voice has becomes slower and lower. His movements are slower. Denies problems with sense of smell, vivid dreams or constipation. He feels that he is stooped when walking. His hand writing has become much smaller and not as neat as it previously was. Denies any tremor.   Walking is much slower and he is shuffling his feet. Balance and stability is also a problem, but fortunately he has not had any falls and continues to walk independently. No numbness/tingling of the feet.   He also complains of mild memory problems and word finding difficulty. He is managing his own finances. He does not drive very much.   He underwent modified barium swallow which showed moderate pharyngreal and esophageal dysphagia. He was evaluated by Dr. Fuller Plan today for the same complaints who agreed with neurology consultation recommended by his PCP for dysphagia.  UPDATE 09/15/2014:  Overall, he feels better because his appetite has increased and he has not lost any more weight.  He denies any problems with swallowing, but complains of increased sneezing, mucus, and sore throat.  He has noticed problems with his short-term memory  such that he can forget things.  He mostly has word-finding difficulties, especially with names.  He manages his own finances without difficulty. He is not forgetting appointments or missing any bills, in fact, he reports having FICO of 800.  Driving has been significant limited because of his vision, but he does not report getting lost.  UPDATE 12/23/2014:  He has noticed marked improvement in generalized stiffness and walking since started sinemet.  He takes 1 tablet three times daily (9am, 3pm, 7pm). He denies noticing any wearing off.  Memory is stable and unchanged.   He no longer has any choking spells or problems with swallowing.  His wife unfortunately had a heart attack a month ago, but is recovering well.   UPDATE 07/07/2015:  He is doing great and feel that his gait and stiffness has improved.  He takes sinemet 1 tab three times daily, but sometimes forgets his evening dose.  He cannot tell any wearing off.  He has noticed lightheadedness sometimes and feels that all of his medications have this as a side effect. He is staying well hydrated.  No interval falls.  Medications:  Current Outpatient Prescriptions on File Prior to Visit  Medication Sig Dispense Refill  . AcetaZOLAMIDE CRYS Place 1 drop into both eyes 2 (two) times daily. 1 drop in each eye in morning and 1 drop in each eye in evening    . ALPHAGAN P 0.1 % SOLN Place 1 drop into the right eye 2 (two) times daily.    Marland Kitchen aspirin 81 MG tablet Take 81 mg by mouth daily.    Marland Kitchen atorvastatin (LIPITOR) 20 MG tablet  TAKE 1 TABLET BY MOUTH EVERY DAY 30 tablet 4  . carbidopa-levodopa (SINEMET IR) 25-100 MG tablet Take 1 tablet by mouth 3 (three) times daily. 270 tablet 3  . dorzolamide-timolol (COSOPT) 22.3-6.8 MG/ML ophthalmic solution PLACE 1 DROP INTO THE RIGHT EYE 2 TIMES DAILY.  11  . fish oil-omega-3 fatty acids 1000 MG capsule Take 1 g by mouth daily.    Marland Kitchen latanoprost (XALATAN) 0.005 % ophthalmic solution PLACE 1 DROP INTO THE RIGHT EYE  NIGHTLY.  12  . linagliptin (TRADJENTA) 5 MG TABS tablet Take 1 tablet (5 mg total) by mouth daily. 30 tablet 6  . losartan (COZAAR) 50 MG tablet Take 2 tablets (100 mg total) by mouth daily. 60 tablet 6  . Multiple Vitamin (MULTIVITAMIN WITH MINERALS) TABS Take 1 tablet by mouth daily.    Marland Kitchen zoster vaccine live, PF, (ZOSTAVAX) 96295 UNT/0.65ML injection Inject 19,400 Units into the skin once. 1 each 0   No current facility-administered medications on file prior to visit.    Allergies:  Allergies  Allergen Reactions  . Glimepiride     REACTION: ? UPSET STOMACH  . Pravastatin     Constipation  . Ramipril      dizziness    Review of Systems:  CONSTITUTIONAL: No fevers, chills, night sweats, or weight loss.  EYES: No visual changes or eye pain ENT: No hearing changes.  No history of nose bleeds.   RESPIRATORY: No cough, wheezing and shortness of breath.   CARDIOVASCULAR: Negative for chest pain, and palpitations.   GI: Negative for abdominal discomfort, blood in stools or black stools.  No recent change in bowel habits.   GU:  No history of incontinence.   MUSCLOSKELETAL: No history of joint pain or swelling.  No myalgias.   SKIN: Negative for lesions, rash, and itching.   ENDOCRINE: Negative for cold or heat intolerance, polydipsia or goiter.   PSYCH:  No depression or anxiety symptoms.   NEURO: As Above.   Vital Signs:  BP 104/70 mmHg  Pulse 76  Ht 5\' 9"  (1.753 m)  Wt 151 lb 9 oz (68.748 kg)  BMI 22.37 kg/m2  SpO2 98%  Neurological Exam: MENTAL STATUS:  Awake, blunted affect, but smiles appropriately.  Oriented to person, place, and time.  Speech mildly hypophonic and slow (improved), no dysarthria.  CRANIAL NERVES:  Left eye is blind with mild ptosis.  Poor blink.  Pupils equal round and reactive to light.  Normal conjugate, extra-ocular eye movements in all directions of gaze.  Face is symmetric. Palate elevates symmetrically.  Tongue is midline.  Pathological facial  reflexes present.  MOTOR:  Motor strength is 5/5 in all extremities.  There is mild rigidity of the LUE.     COORDINATION/GAIT: Finger and heel tapping is improved and does not show decrement or slowed rate.  Able to rise from a chair without using arms. Gait is improved and he is walking upright, with normal cadence and turning with 3 steps, no shuffling is observed.    Data: MRI brain wo contrast 08/28/2014:  No acute intracranial findings. Age-related atrophy with mild to moderate small vessel disease.  Labs 08/25/2014:  Vitamin B12 1435, copper 110, zinc 68  IMPRESSION/PLAN: 1.  Parkinsonism, predominately akinetic-rigid features (LUE rigidity, bradykinesia, hypophonia, shuffling gait). No tremors on exam, gaze preserved. Gait and bradykinesia is improved with sinemet.  - Continue sinemet 25/100 1 tablet three times daily.  Encouraged to use a pillbox or set a reminder   - Continue  to do home exercises and walk  - Start using compression stockings for lightheadedness   2.  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  Greater than 50% of this time was spent in counseling, explanation of diagnosis, planning of further management, and coordination of care.   Thank you for allowing me to participate in patient's care.  If I can answer any additional questions, I would be pleased to do so.    Sincerely,    Juliet Vasbinder K. Posey Pronto, DO

## 2015-07-26 ENCOUNTER — Other Ambulatory Visit: Payer: Self-pay | Admitting: Internal Medicine

## 2015-08-02 ENCOUNTER — Ambulatory Visit (INDEPENDENT_AMBULATORY_CARE_PROVIDER_SITE_OTHER): Payer: Medicare Other | Admitting: Podiatry

## 2015-08-02 ENCOUNTER — Encounter: Payer: Self-pay | Admitting: Podiatry

## 2015-08-02 VITALS — BP 126/77 | HR 77

## 2015-08-02 DIAGNOSIS — M79606 Pain in leg, unspecified: Secondary | ICD-10-CM | POA: Diagnosis not present

## 2015-08-02 DIAGNOSIS — B351 Tinea unguium: Secondary | ICD-10-CM | POA: Diagnosis not present

## 2015-08-02 NOTE — Progress Notes (Signed)
Subjective:  82 year old male presents requesting toe nails trimmed.  Both big toes are painful to walk from thick toe nails. Been out walking for exercise three times a week.  No other problems.  Objective: Pedal pulses are not palpable.  Thick dystrophic nails x 10.  Very thick and enlarged right great toe nail with pain.  No new problems.   Assessment: Onychomycosis x 10.  Dystrophic nails both great toe nails.  Type II DM under control.   Plan: Palliation prn.  Debrided all nails x 10. Return in 3 months or as needed.

## 2015-08-02 NOTE — Patient Instructions (Signed)
Seen for hypertrophic nails. All nails debrided. Return in 3 months or as needed.  

## 2015-09-26 ENCOUNTER — Other Ambulatory Visit: Payer: Self-pay | Admitting: Internal Medicine

## 2015-10-10 ENCOUNTER — Ambulatory Visit (INDEPENDENT_AMBULATORY_CARE_PROVIDER_SITE_OTHER): Payer: Medicare Other | Admitting: Internal Medicine

## 2015-10-10 ENCOUNTER — Encounter: Payer: Self-pay | Admitting: Internal Medicine

## 2015-10-10 VITALS — BP 120/74 | HR 71 | Temp 98.1°F | Resp 12 | Ht 69.0 in | Wt 151.0 lb

## 2015-10-10 DIAGNOSIS — E118 Type 2 diabetes mellitus with unspecified complications: Secondary | ICD-10-CM | POA: Diagnosis not present

## 2015-10-10 DIAGNOSIS — K5900 Constipation, unspecified: Secondary | ICD-10-CM | POA: Diagnosis not present

## 2015-10-10 DIAGNOSIS — I10 Essential (primary) hypertension: Secondary | ICD-10-CM | POA: Diagnosis not present

## 2015-10-10 DIAGNOSIS — E785 Hyperlipidemia, unspecified: Secondary | ICD-10-CM | POA: Diagnosis not present

## 2015-10-10 DIAGNOSIS — Z23 Encounter for immunization: Secondary | ICD-10-CM | POA: Diagnosis not present

## 2015-10-10 DIAGNOSIS — I499 Cardiac arrhythmia, unspecified: Secondary | ICD-10-CM | POA: Diagnosis not present

## 2015-10-10 LAB — CBC WITH DIFFERENTIAL/PLATELET
BASOS ABS: 0 10*3/uL (ref 0.0–0.1)
BASOS PCT: 0.6 % (ref 0.0–3.0)
EOS PCT: 2.4 % (ref 0.0–5.0)
Eosinophils Absolute: 0.1 10*3/uL (ref 0.0–0.7)
HEMATOCRIT: 39.3 % (ref 39.0–52.0)
Hemoglobin: 13 g/dL (ref 13.0–17.0)
LYMPHS ABS: 1 10*3/uL (ref 0.7–4.0)
LYMPHS PCT: 20 % (ref 12.0–46.0)
MCHC: 33 g/dL (ref 30.0–36.0)
MCV: 88.5 fl (ref 78.0–100.0)
Monocytes Absolute: 0.4 10*3/uL (ref 0.1–1.0)
Monocytes Relative: 8.3 % (ref 3.0–12.0)
NEUTROS ABS: 3.3 10*3/uL (ref 1.4–7.7)
NEUTROS PCT: 68.7 % (ref 43.0–77.0)
PLATELETS: 134 10*3/uL — AB (ref 150.0–400.0)
RBC: 4.44 Mil/uL (ref 4.22–5.81)
RDW: 13 % (ref 11.5–15.5)
WBC: 4.9 10*3/uL (ref 4.0–10.5)

## 2015-10-10 LAB — LIPID PANEL
CHOLESTEROL: 148 mg/dL (ref 0–200)
HDL: 57.7 mg/dL (ref >39.00)
LDL CALC: 81 mg/dL (ref 0–99)
NonHDL: 90.74
TRIGLYCERIDES: 50 mg/dL (ref 0.0–149.0)
Total CHOL/HDL Ratio: 3
VLDL: 10 mg/dL (ref 0.0–40.0)

## 2015-10-10 LAB — BASIC METABOLIC PANEL
BUN: 21 mg/dL (ref 6–23)
CALCIUM: 9.1 mg/dL (ref 8.4–10.5)
CHLORIDE: 105 meq/L (ref 96–112)
CO2: 29 mEq/L (ref 19–32)
Creatinine, Ser: 1.12 mg/dL (ref 0.40–1.50)
GFR: 81.02 mL/min (ref >60.00)
GLUCOSE: 174 mg/dL — AB (ref 70–99)
POTASSIUM: 4 meq/L (ref 3.5–5.1)
SODIUM: 140 meq/L (ref 135–145)

## 2015-10-10 LAB — HEMOGLOBIN A1C: Hgb A1c MFr Bld: 6.6 % — ABNORMAL HIGH (ref 4.6–6.5)
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
## 2015-10-10 NOTE — Patient Instructions (Signed)
GO TO THE LAB : Get the blood work     GO TO THE FRONT DESK Schedule your next appointment for a  physical exam in 4 months , fasting 

## 2015-10-10 NOTE — Progress Notes (Signed)
Pre visit review using our clinic review tool, if applicable. No additional management support is needed unless otherwise documented below in the visit note. 

## 2015-10-11 NOTE — Assessment & Plan Note (Signed)
DM: Last A1c 7.0, Tradjenta added. Due for labs. HTN: Good med compliance, BP is okay today, check a BMP, CBC Hyperlipidemia: On Lipitor, check a lipid panel Chronic constipation:Had right-sided abdominal pain, sx essentially resolved. Sees blood per rectum only with straining. Irregular heartbeat?. EKG today showing sinus rhythm with PACs, unchanged from previous. Flu shot today RTC 4 months, CPX, fasting

## 2015-10-22 ENCOUNTER — Other Ambulatory Visit: Payer: Self-pay | Admitting: Internal Medicine

## 2015-10-28 ENCOUNTER — Other Ambulatory Visit: Payer: Self-pay | Admitting: Internal Medicine

## 2015-11-03 ENCOUNTER — Encounter: Payer: Self-pay | Admitting: Gastroenterology

## 2015-11-03 ENCOUNTER — Ambulatory Visit (INDEPENDENT_AMBULATORY_CARE_PROVIDER_SITE_OTHER): Payer: Medicare Other | Admitting: Gastroenterology

## 2015-11-03 VITALS — BP 140/70 | HR 82 | Ht 69.0 in | Wt 152.0 lb

## 2015-11-03 DIAGNOSIS — R109 Unspecified abdominal pain: Secondary | ICD-10-CM | POA: Diagnosis not present

## 2015-11-03 DIAGNOSIS — K5900 Constipation, unspecified: Secondary | ICD-10-CM

## 2015-11-03 NOTE — Progress Notes (Signed)
    History of Present Illness: This is an 82 year old male complaining of constipation and right-sided abdominal pain. He has had constipation for several years and takes Dulcolax tablets intermittently with relief of symptoms. He has had to use Dulcolax more frequently over the past 1-2 years. He often notes mild right-sided abdominal pain when constipated that is relieved with a bowel movement. For the past several weeks he has not had any abdominal pain. He has no other GI complaints. Denies weight loss, diarrhea, change in stool caliber, melena, hematochezia, nausea, vomiting, dysphagia, reflux symptoms, chest pain.   Blood work last month was notable for low platelets, elevated glucose and was otherwise unremarkable.   His last colonoscopy was in 2013 showing one small adenomatous colon polyp.  Current Medications, Allergies, Past Medical History, Past Surgical History, Family History and Social History were reviewed in [employer] record.  Physical Exam: General: Well developed, well nourished, no acute distress Head: Normocephalic and atraumatic Eyes:  sclerae anicteric, EOMI Ears: Normal auditory acuity Mouth: No deformity or lesions Lungs: Clear throughout to auscultation Heart: Regular rate and rhythm; no murmurs, rubs or bruits Abdomen: Soft, non tender and non distended. No masses, hepatosplenomegaly or hernias noted. Normal Bowel sounds Musculoskeletal: Symmetrical with no gross deformities  Pulses:  Normal pulses noted Extremities: No clubbing, cyanosis, edema or deformities noted Neurological: Alert oriented x 4, grossly nonfocal Psychological:  Alert and cooperative. Normal mood and affect  Assessment and Recommendations:  1. Intermittent constipation leading to mild right-sided abdominal pain. Colace once or twice daily on a regular basis and iIf this does not prevent constipation begin MiraLAX once or twice daily on an ongoing basis. If abdominal  pain recurs despite adequate treatment of constipation he is advised to call the office to consider further evaluation. If constipation does not resolve with the above measures he is also advised to call. Return to PCP for ongoing care. GI follow-up when necessary.

## 2015-11-03 NOTE — Patient Instructions (Signed)
Start over the counter Miralax mixing 17 grams in 8 oz of water 1-2 x daily and Colace daily for constipation.   Thank you for choosing me and [facility].  Pricilla Riffle. Dagoberto Ligas., MD., [facility]

## 2015-12-28 ENCOUNTER — Ambulatory Visit (INDEPENDENT_AMBULATORY_CARE_PROVIDER_SITE_OTHER): Payer: Medicare Other | Admitting: Neurology

## 2015-12-28 ENCOUNTER — Encounter: Payer: Self-pay | Admitting: Neurology

## 2015-12-28 VITALS — BP 110/80 | HR 68 | Ht 69.0 in | Wt 157.4 lb

## 2015-12-28 DIAGNOSIS — F514 Sleep terrors [night terrors]: Secondary | ICD-10-CM | POA: Diagnosis not present

## 2015-12-28 DIAGNOSIS — G20 Parkinson's disease: Secondary | ICD-10-CM | POA: Diagnosis not present

## 2015-12-28 NOTE — Progress Notes (Signed)
Follow-up Visit   Date: 12/28/15    AHMANI ALVARADO MRN: [id number] DOB: 1933/10/06   Interim History: Dylan Orozco is a 82 y.o. right-handed African American male with hypertension, diabetes mellitus type II, glaucoma with left eye blindness, history or prostate cancer, hyperlipidemia returning to the clinic for follow-up of idiopathic parkinson's disease.  The patient was accompanied to the clinic by wife who also provides collateral information.    History of present illness: Starting since June 2015, patient has had 20lb unintentional weight loss which he initially attributed to metformin causing reduced appetite and weight loss. Since stopping this medication, he has gained 2lb. He also complains of frequent choking spells with solids, moreso than liquids. He has noticed that his voice has becomes slower and lower. His movements are slower. Denies problems with sense of smell, vivid dreams or constipation. He feels that he is stooped when walking. His hand writing has become much smaller and not as neat as it previously was. Denies any tremor.   Walking is much slower and he is shuffling his feet. Balance and stability is also a problem, but fortunately he has not had any falls and continues to walk independently. No numbness/tingling of the feet.   He also complains of mild memory problems and word finding difficulty. He is managing his own finances. He does not drive very much.   He underwent modified barium swallow which showed moderate pharyngreal and esophageal dysphagia. He was evaluated by Dr. Fuller Plan for the same complaints who agreed with neurology consultation recommended by his PCP for dysphagia.  UPDATE 09/15/2014:  Overall, he feels better because his appetite has increased and he has not lost any more weight.  He denies any problems with swallowing, but complains of increased sneezing, mucus, and sore throat.  He has noticed problems with his  short-term memory such that he can forget things.  He mostly has word-finding difficulties, especially with names.  He manages his own finances without difficulty. He is not forgetting appointments or missing any bills, in fact, he reports having FICO of 800.  Driving has been significant limited because of his vision, but he does not report getting lost.  UPDATE 12/23/2014:  He has noticed marked improvement in generalized stiffness and walking since started sinemet.  He takes 1 tablet three times daily (9am, 3pm, 7pm). He denies noticing any wearing off.  Memory is stable and unchanged.   He no longer has any choking spells or problems with swallowing.  His wife unfortunately had a heart attack a month ago, but is recovering well.   UPDATE 07/07/2015:  He is doing great and feel that his gait and stiffness has improved.  He takes sinemet 1 tab three times daily, but sometimes forgets his evening dose.  He cannot tell any wearing off.  He has noticed lightheadedness sometimes and feels that all of his medications have this as a side effect. He is staying well hydrated.  No interval falls.  UPDATE 12/28/2015:  He was doing very well until a month ago when he began having night terrors.  He would wake up with frightening dreams which would make him fearful to go back to sleep.  He felt this was due to his sinemet so he self tapered off the medication and has not had any more nightmares.  Otherwise, he is doing well and has not noticed that his movement or walking has become slower since stopping sinemet.    Medications:  Current  Outpatient Prescriptions on File Prior to Visit  Medication Sig Dispense Refill  . aspirin 81 MG tablet Take 81 mg by mouth daily.    Marland Kitchen atorvastatin (LIPITOR) 20 MG tablet TAKE 1 TABLET BY MOUTH EVERY DAY 30 tablet 5  . carbidopa-levodopa (SINEMET IR) 25-100 MG tablet Take 1 tablet by mouth 3 (three) times daily. 270 tablet 3  . linagliptin (TRADJENTA) 5 MG TABS tablet Take 1  tablet (5 mg total) by mouth daily. 30 tablet 5  . losartan (COZAAR) 50 MG tablet TAKE 2 TABLETS BY MOUTH DAILY 60 tablet 5  . Multiple Vitamin (MULTIVITAMIN WITH MINERALS) TABS Take 1 tablet by mouth daily.     No current facility-administered medications on file prior to visit.     Allergies:  Allergies  Allergen Reactions  . Glimepiride     REACTION: ? UPSET STOMACH  . Pravastatin     Constipation  . Ramipril      dizziness    Review of Systems:  CONSTITUTIONAL: No fevers, chills, night sweats, or weight loss.  EYES: No visual changes or eye pain ENT: No hearing changes.  No history of nose bleeds.   RESPIRATORY: No cough, wheezing and shortness of breath.   CARDIOVASCULAR: Negative for chest pain, and palpitations.   GI: Negative for abdominal discomfort, blood in stools or black stools.  No recent change in bowel habits.   GU:  No history of incontinence.   MUSCLOSKELETAL: No history of joint pain or swelling.  No myalgias.   SKIN: Negative for lesions, rash, and itching.   ENDOCRINE: Negative for cold or heat intolerance, polydipsia or goiter.   PSYCH:  No depression or anxiety symptoms.   NEURO: As Above.   Vital Signs:  BP 110/80   Pulse 68   Ht 5\' 9"  (1.753 m)   Wt 157 lb 7 oz (71.4 kg)   SpO2 100%   BMI 23.25 kg/m   Neurological Exam: MENTAL STATUS:  Awake, blunted affect, but smiles appropriately.  Oriented to person, place, and time.  Speech mildly hypophonic and slow (stable), no dysarthria.  CRANIAL NERVES:  Left eye is blind with mild ptosis.  Poor blink.  Pupils equal round and reactive to light.  Normal conjugate, extra-ocular eye movements in all directions of gaze.  Face is symmetric. Palate elevates symmetrically.  Tongue is midline.    MOTOR:  Motor strength is 5/5 in all extremities.  There is mild rigidity of the LUE.     COORDINATION/GAIT: Finger and heel tapping shows mildly decrement and slowed rate on the left.  Able to rise from a chair  without using arms. Gait is improved and he is walking upright, with normal cadence and turning with 3 steps, no shuffling is observed.    Data: MRI brain wo contrast 08/28/2014:  No acute intracranial findings. Age-related atrophy with mild to moderate small vessel disease.  Labs 08/25/2014:  Vitamin B12 1435, copper 110, zinc 68  IMPRESSION/PLAN: 1.  Parkinsonism, predominately akinetic-rigid features (LUE rigidity, bradykinesia, hypophonia, shuffling gait). No tremors on exam, gaze preserved. Gait and bradykinesia is improved with sinemet. However, due to severe night terrors, he stopped sinemet and clinically there is evidence of left side bradykinesia and increased tone.  I would like to try him back on a lower dose of levadopa 25/100mg  1 tablet twice daily (8am and 3pm) and avoid night time dose.  He was doing very well on sinemet and hopefully we can optimize a dose that will not cause  such severe night terrors.   2.  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  Greater than 50% of this time was spent in counseling, explanation of diagnosis, planning of further management, and coordination of care.   Thank you for allowing me to participate in patient's care.  If I can answer any additional questions, I would be pleased to do so.    Sincerely,    Careena Degraffenreid K. Posey Pronto, DO

## 2015-12-28 NOTE — Patient Instructions (Addendum)
1.  Start levadopa 25/100mg  at 8am and 2-3pm. 2.  Call my office if you develop night terrors again  Return to clinic 6 months

## 2016-01-06 ENCOUNTER — Ambulatory Visit: Payer: Medicare Other | Admitting: Neurology

## 2016-01-25 ENCOUNTER — Ambulatory Visit (INDEPENDENT_AMBULATORY_CARE_PROVIDER_SITE_OTHER): Payer: Medicare Other | Admitting: Podiatry

## 2016-01-25 ENCOUNTER — Encounter: Payer: Self-pay | Admitting: Podiatry

## 2016-01-25 DIAGNOSIS — M79674 Pain in right toe(s): Secondary | ICD-10-CM

## 2016-01-25 DIAGNOSIS — L600 Ingrowing nail: Secondary | ICD-10-CM | POA: Diagnosis not present

## 2016-01-25 DIAGNOSIS — B351 Tinea unguium: Secondary | ICD-10-CM | POA: Diagnosis not present

## 2016-01-25 DIAGNOSIS — M79606 Pain in leg, unspecified: Secondary | ICD-10-CM

## 2016-01-25 NOTE — Progress Notes (Signed)
Subjective:  83 year old male presents requesting toe nails trimmed.  Both big toes are painful to walk from thick toe nails.   Objective: Pedal pulses are not palpable.  Draining nail under nail plate right great toe with grossly over grown of nail plate.  Thick dystrophic nails x 10.  Very thick and enlarged right great toe nail with pain.  No new problems.   Assessment: Onychomycosis x 10.  Ingrown nail with drainage right great toe. Dystrophic nails both great toe nails.  Type II DM under control.   Plan: Removed center dorsal nail plate and drained. Cleansed with iodine and dressing applied. Debrided all nails x 10. Instructed to soak in [employer] salt water daily for 1 week. Return in 3 months or as needed.

## 2016-01-25 NOTE — Patient Instructions (Signed)
Seen for hypertrophic nails. Infected nail resected and dressed with Iodine. All nails debrided. Soak in [employer] salt water daily for 7 days. Return in 3 months or as needed.

## 2016-02-02 ENCOUNTER — Encounter: Payer: Self-pay | Admitting: Gastroenterology

## 2016-02-08 ENCOUNTER — Encounter: Payer: Medicare Other | Admitting: Internal Medicine

## 2016-02-24 ENCOUNTER — Ambulatory Visit (INDEPENDENT_AMBULATORY_CARE_PROVIDER_SITE_OTHER): Payer: Medicare Other | Admitting: Physician Assistant

## 2016-02-24 ENCOUNTER — Telehealth: Payer: Self-pay | Admitting: *Deleted

## 2016-02-24 ENCOUNTER — Encounter: Payer: Self-pay | Admitting: Physician Assistant

## 2016-02-24 VITALS — BP 124/70 | HR 68 | Ht 69.0 in | Wt 161.2 lb

## 2016-02-24 DIAGNOSIS — Z86010 Personal history of colonic polyps: Secondary | ICD-10-CM | POA: Diagnosis not present

## 2016-02-24 NOTE — Patient Instructions (Signed)
Follow up with Dr. Fuller Plan as needed for any problems.

## 2016-02-24 NOTE — Progress Notes (Signed)
Reviewed and agree with management plan.  Kyira Volkert T. Aaliyan Brinkmeier, MD FACG 

## 2016-02-24 NOTE — Progress Notes (Signed)
Subjective:    Patient ID: Dylan Orozco, male    DOB: [birth year], 83 y.o.   MRN: [id number]  HPI Dylan Orozco is a pleasant 83 year old African-American male known to Dr. Fuller Plan who comes in today to discuss recall colonoscopy. Patient was last seen in the office in October 2017 with complaints of constipation. He says he is doing very well at present with stool softeners and when necessary MiraLAX. Patient has history of hypertension, diabetes mellitus, Parkinson's disease and prostate cancer. He also has history of adenomatous colon polyps with last colonoscopy in 2013 with finding of a 6 mm sessile polyp in the transverse colon and 5 mm polyp in the descending colon. Path on both polyps consistent with Tubular Adenomas. Patient states that he doesn't want to have another colonoscopy unless absolutely necessary. He states that he understands that colonoscopies are not generally done after age 72. Again he has no current complaints of abdominal pain melena or hematochezia.  Review of Systems Pertinent positive and negative review of systems were noted in the above HPI section.  All other review of systems was otherwise negative.  Outpatient Encounter Prescriptions as of 02/24/2016  Medication Sig  . aspirin 81 MG tablet Take 81 mg by mouth daily.  Marland Kitchen atorvastatin (LIPITOR) 20 MG tablet TAKE 1 TABLET BY MOUTH EVERY DAY  . brimonidine (ALPHAGAN P) 0.1 % SOLN PLACE 1 DROP INTO THE RIGHT EYE 3 TIMES DAILY.  . carbidopa-levodopa (SINEMET IR) 25-100 MG tablet Take 1 tablet by mouth 3 (three) times daily.  . dorzolamide (TRUSOPT) 2 % ophthalmic solution PLACE 1 DROP INTO THE RIGHT EYE 2 TIMES DAILY.  Marland Kitchen latanoprost (XALATAN) 0.005 % ophthalmic solution PLACE 1 DROP INTO THE RIGHT EYE NIGHTLY.  . linagliptin (TRADJENTA) 5 MG TABS tablet Take 1 tablet (5 mg total) by mouth daily.  Marland Kitchen losartan (COZAAR) 50 MG tablet TAKE 2 TABLETS BY MOUTH DAILY  . Multiple Vitamin (MULTIVITAMIN WITH MINERALS) TABS Take 1  tablet by mouth daily.  . timolol (TIMOPTIC) 0.5 % ophthalmic solution PLACE 1 DROP INTO THE RIGHT EYE 2 TIMES DAILY.   No facility-administered encounter medications on file as of 02/24/2016.    Allergies  Allergen Reactions  . Glimepiride     REACTION: ? UPSET STOMACH  . Pravastatin     Constipation  . Ramipril      dizziness   Patient Active Problem List   Diagnosis Date Noted  . PCP NOTES >>>>>>>>>>>>>>>>>>>>>>>>>>>>>>>> 09/23/2014  . Parkinsonism ([place]) 08/25/2014  . Pain in lower limb 03/01/2014  . Constipation 10/15/2013  . Annual physical exam 09/10/2013  . HTN (hypertension) 07/06/2013  . Pain, foot 02/11/2013  . Onychomycosis 05/23/2012  . Open angle primary glaucoma 01/24/2011  . DM II (diabetes mellitus, type II), controlled ([place]) 11/04/2009  . Pancytopenia ([place]) 11/04/2009  . PROSTATE CANCER, HX OF 10/05/2008  . COLONIC POLYPS, HX OF 10/05/2008  . HYPERLIPIDEMIA 03/04/2007  . WEIGHT LOSS 08/15/2006   Social History   Social History  . Marital status: Married    Spouse name: [name]  . Number of children: 2  . Years of education: N/A   Occupational History  . retired- PHD education    Social History Main Topics  . Smoking status: Former Smoker    Quit date: 01/22/1993  . Smokeless tobacco: Never Used     Comment: smoked 1953-1995, up to 3 cigarettes /day  . Alcohol use 0.0 oz/week     Comment:  very rarely 0-1 wine a month  .  Drug use: No  . Sexual activity: Not on file   Other Topics Concern  . 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 Stines's family history includes Diabetes in his brother, mother, and sister; Heart attack in his brother; Liver cancer in his sister.      Objective:    Vitals:   02/24/16 0948  BP: 124/70  Pulse: 68    Physical Exam  well-developed elderly African-American  male in no acute distress, pleasant blood pressure 124/70 pulse 68 HEENT; nontraumatic normocephalic EOMI PERRLA sclera anicteric, Cardiovascular; regular rate and rhythm with S1-S2 no murmur or gallop, Pulmonary ;clear bilaterally, Abdomen ;soft nontender nondistended bowel sounds active no palpable mass or hepatosplenomegaly, Rectal ;exam not done, Neuropsych ;mood and affect appropriate, he does have Parkinson's facies and a shuffling gait.       Assessment & Plan:   #33 83 year old African-American male with Parkinson's disease, and history of adenomatous colon polyps. Last colonoscopy 2013 with removal of a 6 mm and 5 mm adenomatous polyp. #2 intermittent constipation 3  adult-onset diabetes mellitus #4 hypertension  Plan; Patient is almost 42 and with comorbidity of diabetes and Parkinson's disease. Continued surveillance colonoscopies not indicated due to age. Patient is happy with this plan and will follow up with Dr. Fuller Plan or myself on an as-needed basis.   Ellouise Mcwhirter S Alanta Scobey PA-C 02/24/2016   Cc: Colon Branch, MD

## 2016-02-24 NOTE — Telephone Encounter (Signed)
Scheduled AWV 03/12/16 @1 .
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
## 2016-03-09 NOTE — Progress Notes (Signed)
Pre visit review using our clinic review tool, if applicable. No additional management support is needed unless otherwise documented below in the visit note. 

## 2016-03-12 ENCOUNTER — Ambulatory Visit (INDEPENDENT_AMBULATORY_CARE_PROVIDER_SITE_OTHER): Payer: Medicare Other | Admitting: Internal Medicine

## 2016-03-12 ENCOUNTER — Encounter: Payer: Self-pay | Admitting: Internal Medicine

## 2016-03-12 VITALS — BP 126/66 | HR 81 | Temp 97.6°F | Resp 14 | Ht 69.0 in | Wt 163.6 lb

## 2016-03-12 DIAGNOSIS — Z8546 Personal history of malignant neoplasm of prostate: Secondary | ICD-10-CM

## 2016-03-12 DIAGNOSIS — Z0000 Encounter for general adult medical examination without abnormal findings: Secondary | ICD-10-CM | POA: Diagnosis not present

## 2016-03-12 DIAGNOSIS — I10 Essential (primary) hypertension: Secondary | ICD-10-CM

## 2016-03-12 DIAGNOSIS — E118 Type 2 diabetes mellitus with unspecified complications: Secondary | ICD-10-CM | POA: Diagnosis not present

## 2016-03-12 LAB — BASIC METABOLIC PANEL
BUN: 23 mg/dL (ref 6–23)
CALCIUM: 9.4 mg/dL (ref 8.4–10.5)
CO2: 30 mEq/L (ref 19–32)
Chloride: 107 mEq/L (ref 96–112)
Creatinine, Ser: 1.18 mg/dL (ref 0.40–1.50)
GFR: 76.21 mL/min (ref >60.00)
Glucose, Bld: 113 mg/dL — ABNORMAL HIGH (ref 70–99)
POTASSIUM: 4.4 meq/L (ref 3.5–5.1)
SODIUM: 143 meq/L (ref 135–145)

## 2016-03-12 LAB — HEMOGLOBIN A1C: Hgb A1c MFr Bld: 7 % — ABNORMAL HIGH (ref 4.6–6.5)

## 2016-03-12 NOTE — Assessment & Plan Note (Addendum)
Td 09-2014; pnm 23--2014; prevnar--2015; zostavax--reports he had the shingles shot already  Flu shot - 09-2015  CCS: Last Cscope 02-2011, 2 polyps, saw GI 2- 2018, no further screening  PSAs no recent urology visit-- referral  Labs: BMP, A1c Diet-exercise discussed  Palpable Ao--- [place] 9-15 neg AAA
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
## 2016-03-12 NOTE — Patient Instructions (Addendum)
Continue to eat heart healthy diet (full of fruits, vegetables, whole grains, lean protein, water--limit salt, fat, and sugar intake) and increase physical activity as tolerated. ==========================  GO TO THE LAB : Get the blood work     GO TO THE FRONT DESK Schedule your next appointment for a  checkup in 6 months

## 2016-03-13 NOTE — Assessment & Plan Note (Signed)
DM: Seems to be doing well, continue Tradjenta. Check A1c HTN: Seems well-controlled with losartan. Check a BMP Hyperlipidemia: On atorvastatin, last FLP satisfactory H/o Parkinson's, per neurology H/o  dysphagia: Not an issue at this point per patient. H/o wt loss: Resolved, his BMI is very healthy, recommend to keep wt the way it is  RTC 6 months

## 2016-04-09 ENCOUNTER — Other Ambulatory Visit: Payer: Self-pay | Admitting: Neurology

## 2016-04-20 ENCOUNTER — Other Ambulatory Visit: Payer: Self-pay | Admitting: Internal Medicine

## 2016-04-24 ENCOUNTER — Other Ambulatory Visit: Payer: Self-pay | Admitting: Internal Medicine

## 2016-04-27 ENCOUNTER — Other Ambulatory Visit: Payer: Self-pay | Admitting: Internal Medicine

## 2016-06-27 ENCOUNTER — Ambulatory Visit (INDEPENDENT_AMBULATORY_CARE_PROVIDER_SITE_OTHER): Payer: Medicare Other | Admitting: Neurology

## 2016-06-27 ENCOUNTER — Encounter: Payer: Self-pay | Admitting: Neurology

## 2016-06-27 VITALS — BP 160/98 | HR 108 | Ht 69.0 in | Wt 157.6 lb

## 2016-06-27 DIAGNOSIS — G20 Parkinson's disease: Secondary | ICD-10-CM

## 2016-06-27 DIAGNOSIS — R2689 Other abnormalities of gait and mobility: Secondary | ICD-10-CM

## 2016-06-27 NOTE — Patient Instructions (Addendum)
Continue levadopa 25/100mg  1 tablet at 9am and 3pm  Return to clinic 6 months

## 2016-06-27 NOTE — Progress Notes (Signed)
Follow-up Visit   Date: 06/27/16    Dylan Orozco MRN: [id number] DOB: Mar 04, 1933   Interim History: HIGINIO Orozco is a 83 y.o. right-handed African American male with hypertension, diabetes mellitus type II, glaucoma with left eye blindness, history or prostate cancer, hyperlipidemia returning to the clinic for follow-up of idiopathic parkinson's disease.  The patient was accompanied to the clinic by self.   History of present illness: Starting since June 2015, patient has had 20lb unintentional weight loss which he initially attributed to metformin causing reduced appetite and weight loss. Since stopping this medication, he has gained 2lb. He also complains of frequent choking spells with solids, moreso than liquids. He has noticed that his voice has becomes slower and lower. His movements are slower. Denies problems with sense of smell, vivid dreams or constipation. He feels that he is stooped when walking. His hand writing has become much smaller and not as neat as it previously was. Denies any tremor.  Walking is much slower and he is shuffling his feet.   He also complains of mild memory problems and word finding difficulty. He is managing his own finances. He does not drive very much.   He underwent modified barium swallow which showed moderate pharyngreal and esophageal dysphagia. He was evaluated by Dr. Fuller Plan for the same complaints who agreed with neurology consultation recommended by his PCP for dysphagia.  UPDATE 09/15/2014:  Overall, he feels better because his appetite has increased and he has not lost any more weight.  He denies any problems with swallowing, but complains of increased sneezing, mucus, and sore throat.  He has noticed problems with his short-term memory such that he can forget things.  He mostly has word-finding difficulties, especially with names.  He manages his own finances without difficulty. He is not forgetting appointments or missing  any bills, in fact, he reports having FICO of 800.  Driving has been significant limited because of his vision, but he does not report getting lost.  UPDATE 12/23/2014:  He has noticed marked improvement in generalized stiffness and walking since started sinemet.  He takes 1 tablet three times daily (9am, 3pm, 7pm). He denies noticing any wearing off.  Memory is stable and unchanged.   He no longer has any choking spells or problems with swallowing.  His wife unfortunately had a heart attack a month ago, but is recovering well.   UPDATE 07/07/2015:  He is doing great and feel that his gait and stiffness has improved.  He takes sinemet 1 tab three times daily, but sometimes forgets his evening dose.  He cannot tell any wearing off.  He has noticed lightheadedness sometimes and feels that all of his medications have this as a side effect. He is staying well hydrated.  No interval falls.  UPDATE 12/28/2015:  He was doing very well until a month ago when he began having night terrors.  He would wake up with frightening dreams which would make him fearful to go back to sleep.  He felt this was due to his sinemet so he self tapered off the medication and has not had any more nightmares.  Otherwise, he is doing well and has not noticed that his movement or walking has become slower since stopping sinemet.    UPDATE 06/27/2016:  Since restarting sinemet 1 tab twice daily, his movements has improved and he does not have night terrors or hallucinations.  He complains of stuttering with words that begin with "s", which  can cause social embarrassment sometimes.  He has not suffered any falls and continues to walk unassisted, but has noticed that he is shuffling more.  He walks about a mile daily and stays active in church.    Medications:  Current Outpatient Prescriptions on File Prior to Visit  Medication Sig Dispense Refill  . aspirin 81 MG tablet Take 81 mg by mouth daily.    Marland Kitchen atorvastatin (LIPITOR) 20 MG tablet  Take 1 tablet (20 mg total) by mouth daily. 30 tablet 5  . brimonidine (ALPHAGAN P) 0.1 % SOLN PLACE 1 DROP INTO THE RIGHT EYE 3 TIMES DAILY.    . carbidopa-levodopa (SINEMET IR) 25-100 MG tablet TAKE 1 TABLET BY MOUTH 3 TIMES A DAY 270 tablet 3  . dorzolamide (TRUSOPT) 2 % ophthalmic solution PLACE 1 DROP INTO THE RIGHT EYE 2 TIMES DAILY.  0  . latanoprost (XALATAN) 0.005 % ophthalmic solution PLACE 1 DROP INTO THE RIGHT EYE NIGHTLY.    . linagliptin (TRADJENTA) 5 MG TABS tablet Take 1 tablet (5 mg total) by mouth daily. 30 tablet 5  . losartan (COZAAR) 50 MG tablet Take 2 tablets (100 mg total) by mouth daily. 60 tablet 5  . Multiple Vitamin (MULTIVITAMIN WITH MINERALS) TABS Take 1 tablet by mouth daily.    . timolol (TIMOPTIC) 0.5 % ophthalmic solution PLACE 1 DROP INTO THE RIGHT EYE 2 TIMES DAILY.  0   No current facility-administered medications on file prior to visit.     Allergies:  Allergies  Allergen Reactions  . Glimepiride     REACTION: ? UPSET STOMACH  . Pravastatin     Constipation  . Ramipril      dizziness    Review of Systems:  CONSTITUTIONAL: No fevers, chills, night sweats, or weight loss.  EYES: No visual changes or eye pain ENT: No hearing changes.  No history of nose bleeds.   RESPIRATORY: No cough, wheezing and shortness of breath.   CARDIOVASCULAR: Negative for chest pain, and palpitations.   GI: Negative for abdominal discomfort, blood in stools or black stools.  No recent change in bowel habits.   GU:  No history of incontinence.   MUSCLOSKELETAL: No history of joint pain or swelling.  No myalgias.   SKIN: Negative for lesions, rash, and itching.   ENDOCRINE: Negative for cold or heat intolerance, polydipsia or goiter.   PSYCH:  No depression or anxiety symptoms.   NEURO: As Above.   Vital Signs:  BP (!) 160/98   Pulse (!) 108   Ht 5\' 9"  (1.753 m)   Wt 157 lb 9 oz (71.5 kg)   SpO2 98%   BMI 23.27 kg/m   Neurological Exam: MENTAL STATUS:   Awake, blunted affect, but smiles appropriately.  Oriented to person, place, and time.  Speech mildly hypophonic and slow (stable), no dysarthria.  CRANIAL NERVES:  Left eye is blind with mild ptosis.  Poor blink.  Pupils equal round and reactive to light.  Normal conjugate, extra-ocular eye movements in all directions of gaze.  Face is symmetric.    MOTOR:  Motor strength is 5/5 in all extremities.  There is mild rigidity of the upper extremities.     COORDINATION/GAIT: Finger and heel tapping shows reduced amplitude, normal rate. Able to rise from a chair without using arms. Gait shows mildly stooped posture with small shuffling gait.   Data: MRI brain wo contrast 08/28/2014:  No acute intracranial findings. Age-related atrophy with mild to moderate small vessel disease.  Labs 08/25/2014:  Vitamin B12 1435, copper 110, zinc 68  IMPRESSION/PLAN: 1.  Parkinsonism, predominately akinetic-rigid features (LUE rigidity, bradykinesia, hypophonia, shuffling gait). Offered to increase sinemet to 1 tab TID, but he would like to stay on the current dose because he is doing well and developed nightmares at the higher dose.  He will continue levadopa 25/100mg  1 tablet at 9am and 3pm.  Encouraged to start exercise program and keep daily walking routine  2.  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  Greater than 50% of this time was spent in counseling, explanation of diagnosis, planning of further management, and coordination of care.   Thank you for allowing me to participate in patient's care.  If I can answer any additional questions, I would be pleased to do so.    Sincerely,    Donika K. Posey Pronto, DO

## 2016-06-28 ENCOUNTER — Ambulatory Visit: Payer: Medicare Other | Admitting: Podiatry

## 2016-06-28 ENCOUNTER — Encounter: Payer: Self-pay | Admitting: Podiatry

## 2016-06-28 ENCOUNTER — Ambulatory Visit (INDEPENDENT_AMBULATORY_CARE_PROVIDER_SITE_OTHER): Payer: Medicare Other | Admitting: Podiatry

## 2016-06-28 DIAGNOSIS — M79606 Pain in leg, unspecified: Secondary | ICD-10-CM | POA: Diagnosis not present

## 2016-06-28 DIAGNOSIS — B351 Tinea unguium: Secondary | ICD-10-CM

## 2016-06-28 DIAGNOSIS — L600 Ingrowing nail: Secondary | ICD-10-CM

## 2016-06-28 NOTE — Patient Instructions (Signed)
Seen for hypertrophic nails. All nails debrided. Return in 3 months or as needed.  

## 2016-06-28 NOTE — Progress Notes (Signed)
Subjective:  83 year old male presents requesting toe nails trimmed. Stated that his blood sugar is under control. Both big toes are painful to walk from thick toe nails.   Objective: Pedal pulses are not palpable.  Painful ingrown nail left great toe. Thick dystrophic nails x 10.  Very thick and enlarged right great toe nail with pain.  No new problems.   Assessment: Onychomycosis x 10.  Painful ingrown nail left great toe. No drainage noted. Dystrophic nails both great toe nails.  Type II DM under control.   Plan: Debrided all nails x 10. Return in 3 months or as needed.

## 2016-10-16 ENCOUNTER — Other Ambulatory Visit: Payer: Self-pay | Admitting: Internal Medicine

## 2016-10-17 ENCOUNTER — Other Ambulatory Visit: Payer: Self-pay | Admitting: Internal Medicine

## 2016-12-13 ENCOUNTER — Other Ambulatory Visit: Payer: Self-pay | Admitting: Internal Medicine

## 2016-12-14 ENCOUNTER — Other Ambulatory Visit: Payer: Self-pay | Admitting: Internal Medicine

## 2016-12-17 ENCOUNTER — Other Ambulatory Visit: Payer: Self-pay | Admitting: Internal Medicine

## 2016-12-28 ENCOUNTER — Ambulatory Visit: Payer: Medicare Other | Admitting: Neurology

## 2016-12-31 ENCOUNTER — Other Ambulatory Visit: Payer: Self-pay | Admitting: Internal Medicine

## 2017-01-01 ENCOUNTER — Encounter: Payer: Self-pay | Admitting: Internal Medicine

## 2017-01-15 ENCOUNTER — Other Ambulatory Visit: Payer: Self-pay | Admitting: Internal Medicine

## 2017-01-22 HISTORY — PX: PROSTATE BIOPSY: SHX241

## 2017-01-30 ENCOUNTER — Other Ambulatory Visit: Payer: Self-pay | Admitting: Internal Medicine

## 2017-01-31 ENCOUNTER — Ambulatory Visit: Payer: Medicare Other | Admitting: Podiatry

## 2017-01-31 ENCOUNTER — Encounter: Payer: Self-pay | Admitting: Podiatry

## 2017-01-31 DIAGNOSIS — B351 Tinea unguium: Secondary | ICD-10-CM | POA: Diagnosis not present

## 2017-01-31 DIAGNOSIS — M79606 Pain in leg, unspecified: Secondary | ICD-10-CM | POA: Diagnosis not present

## 2017-01-31 DIAGNOSIS — L600 Ingrowing nail: Secondary | ICD-10-CM | POA: Diagnosis not present

## 2017-01-31 NOTE — Patient Instructions (Signed)
Seen for hypertrophic nails. All nails debrided. Return in 3 months or as needed.  

## 2017-01-31 NOTE — Progress Notes (Signed)
Subjective: 84 y.o. year old male patient presents complaining of painful nails. Patient requests toe nails, corns and calluses trimmed. Denies any new problems.  Objective: Dermatologic: Thick yellow deformed nails x 10 with pain in both great toe nails. Vascular: Pedal pulses are not palpable. Orthopedic: Rectus foot with post surgical bunion correction left foot. Neurologic: All epicritic and tactile sensations grossly intact.  Assessment: Dystrophic mycotic nails x 10. Pain in both great toes.  Treatment: All mycotic nails, corns, calluses debrided.  Return in 3 months or as needed.

## 2017-02-05 ENCOUNTER — Other Ambulatory Visit: Payer: Self-pay | Admitting: Internal Medicine

## 2017-02-10 IMAGING — MR MR HEAD W/O CM
10 series · 48 of 48 positions shown · non-contrast
Comparison: CT head 06/29/2008.

CLINICAL DATA: Unsteady gait for 2 months.Parkinsonism G20
(S78-64-CM)
Dysphagia, pharyngoesophageal phase OQ3.QV (S78-64-CM)
Bradykinesia 10Z.K (S78-64-CM)
Hyperreflexia of jaw DG5.G (S78-64-CM)
Blindness of left eye L6E.ER (S78-64-CM)
Shuffling gait PK0.TI (S78-64-CM)

EXAM:
MRI HEAD WITHOUT CONTRAST
TECHNIQUE: Multiplanar, multiecho pulse sequences of the brain and surrounding
structures were obtained without intravenous contrast.

[Series 2: T1 · sagittal · 5.0mm · 0.45mm/px · 3 of 20 slices shown]
[im 1/20]
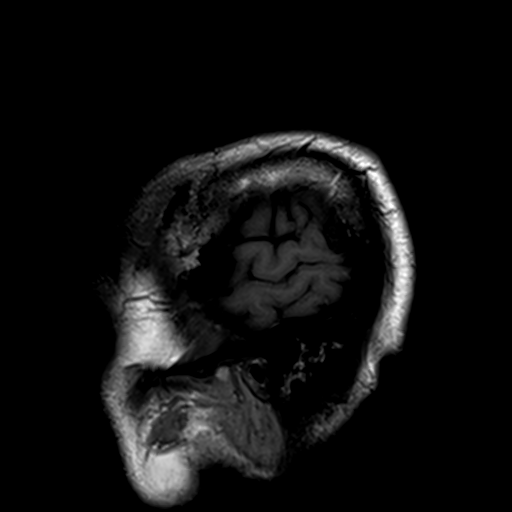
[im 10/20]
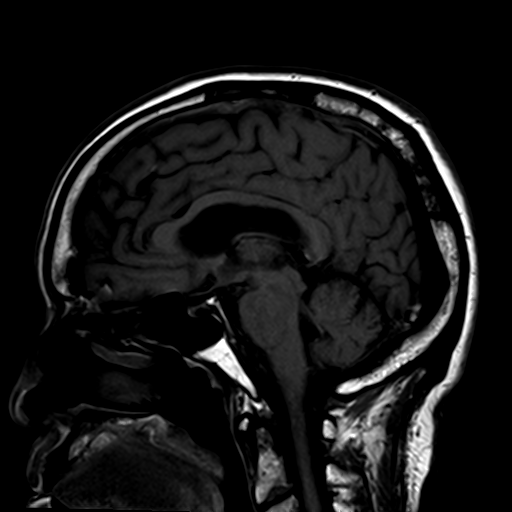
[im 20/20]
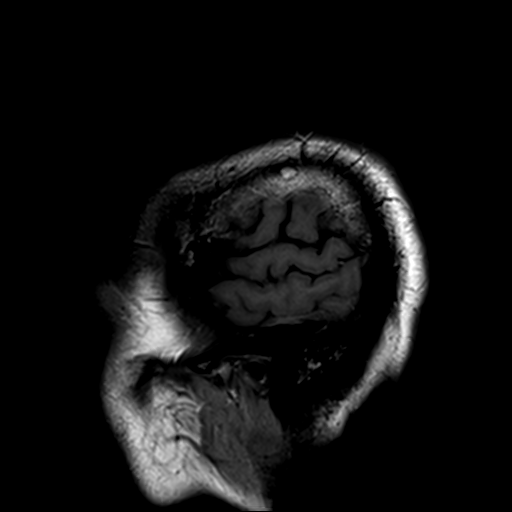

[Series 3: DWI · axial · 3.0mm · 1.80mm/px · z∈[-82,+65]mm · 10 of 100 slices shown (1 of 4)]
[im 1/100]
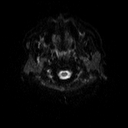
[im 12/100]
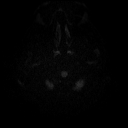
[im 23/100]
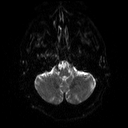
[im 34/100]
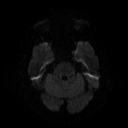
[im 45/100]
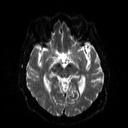
[im 56/100]
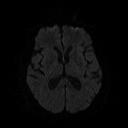
[im 67/100]
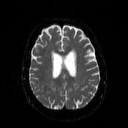
[im 78/100]
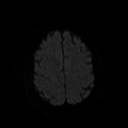
[im 89/100]
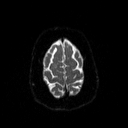
[im 100/100]
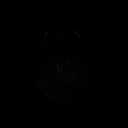

[Series 4: DWI · axial · 3.0mm · 1.80mm/px · z∈[-82,+65]mm · 5 of 50 slices shown (2 of 4)]
[im 1/50]
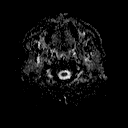
[im 13/50]
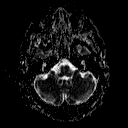
[im 25/50]
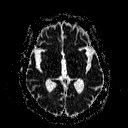
[im 37/50]
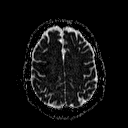
[im 50/50]
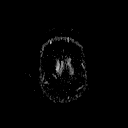

[Series 6: swi_images · axial · 2.0mm · 0.90mm/px · z∈[-72,+69]mm · 7 of 72 slices shown]
[im 1/72]
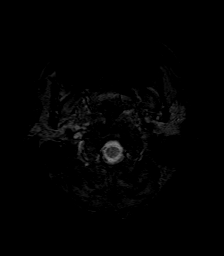
[im 12/72]
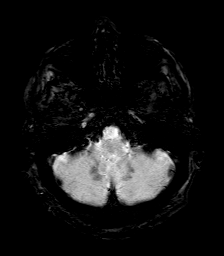
[im 24/72]
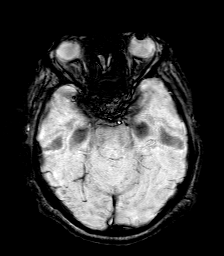
[im 36/72]
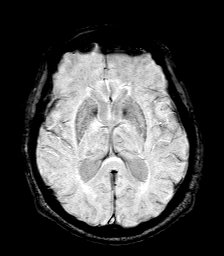
[im 48/72]
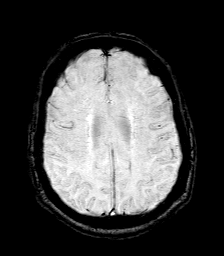
[im 60/72]
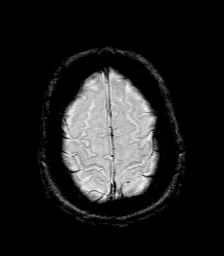
[im 72/72]
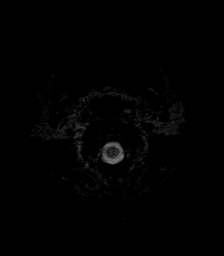

[Series 7: DWI · coronal · 5.0mm · 1.80mm/px · 6 of 60 slices shown (3 of 4)]
[im 1/60]
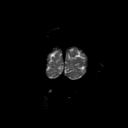
[im 12/60]
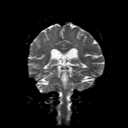
[im 24/60]
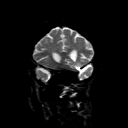
[im 36/60]
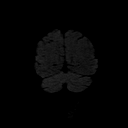
[im 48/60]
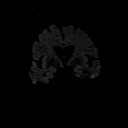
[im 60/60]
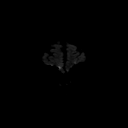

[Series 8: DWI · coronal · 5.0mm · 1.80mm/px · 3 of 30 slices shown (4 of 4)]
[im 1/30]
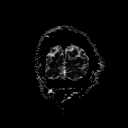
[im 15/30]
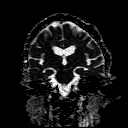
[im 30/30]
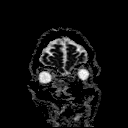

[Series 9: T2 · axial · 5.0mm · 0.45mm/px · z∈[-71,+65]mm · 2 of 22 slices shown (1 of 2)]
[im 1/22]
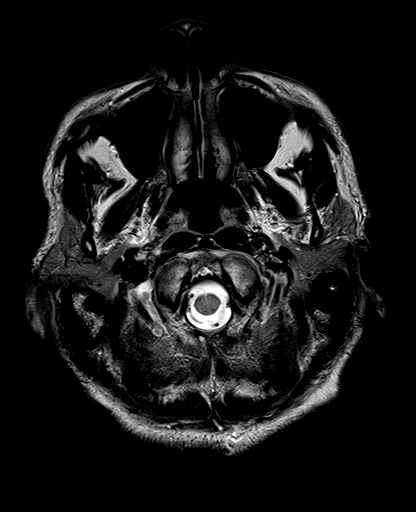
[im 22/22]
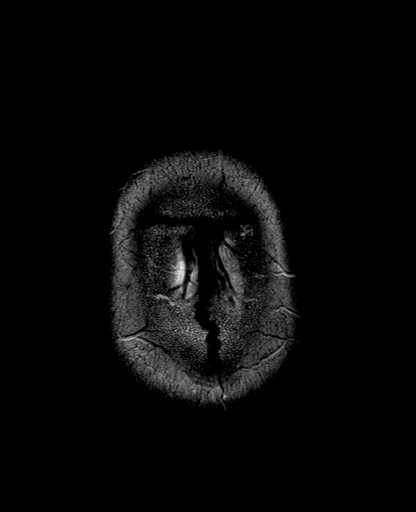

[Series 10: FLAIR · axial · 5.0mm · 0.45mm/px · z∈[-71,+65]mm · 2 of 22 slices shown]
[im 1/22]
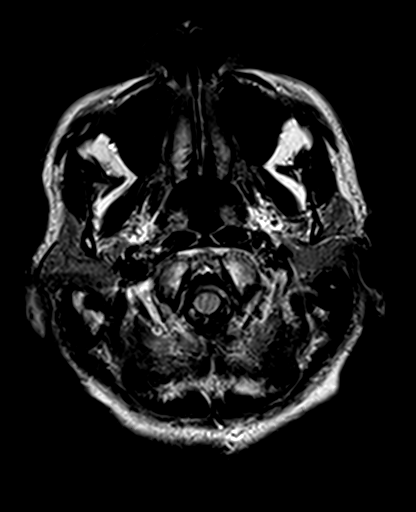
[im 22/22]
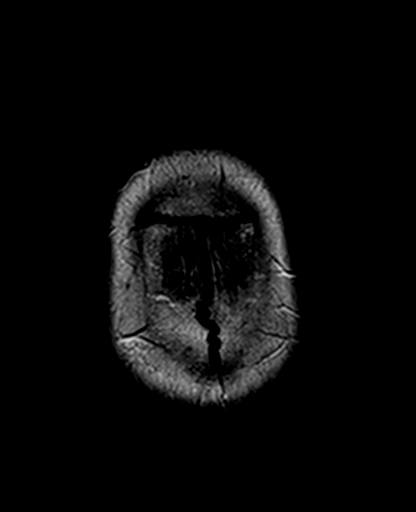

[Series 11: mpr tra · axial · 2.0mm · 0.45mm/px · z∈[-82,+76]mm · 8 of 80 slices shown]
[im 1/80]
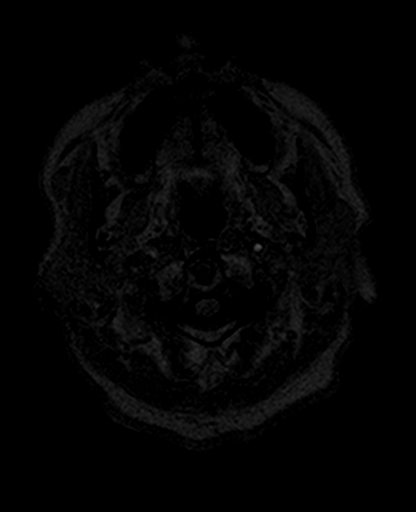
[im 12/80]
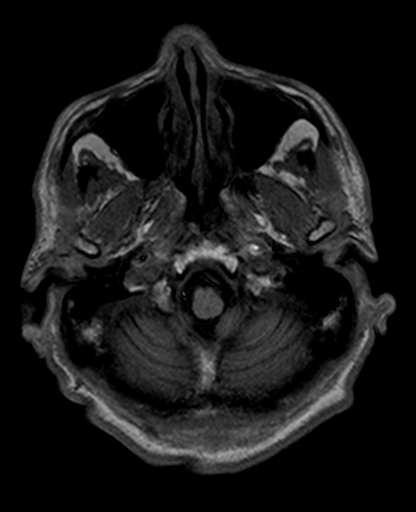
[im 23/80]
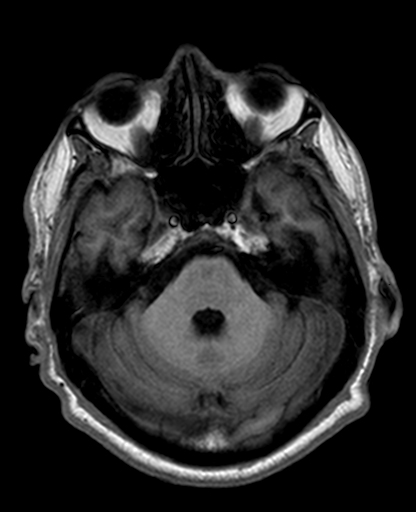
[im 34/80]
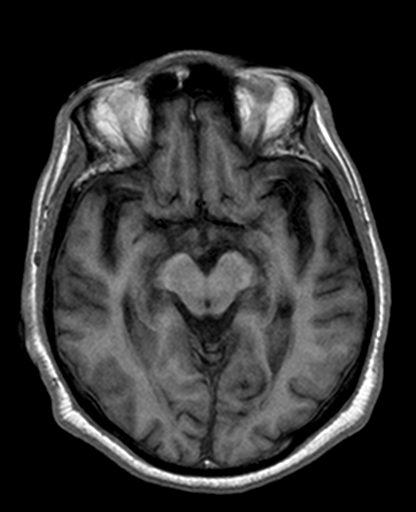
[im 46/80]
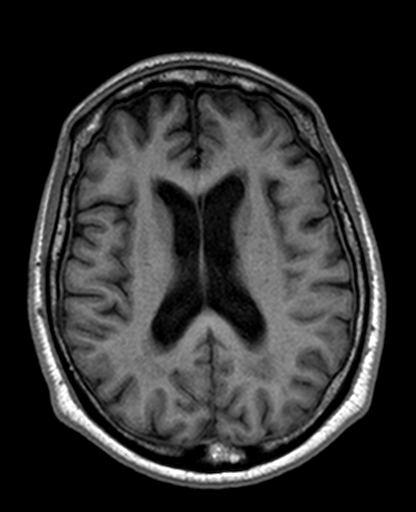
[im 57/80]
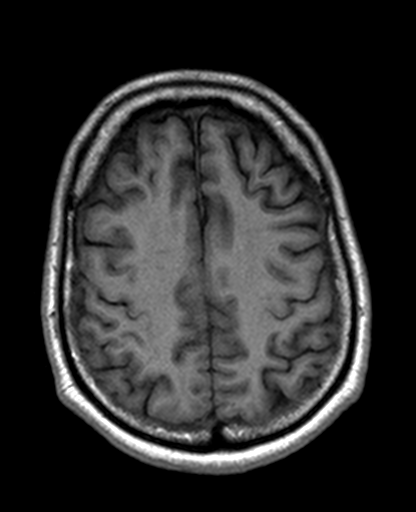
[im 68/80]
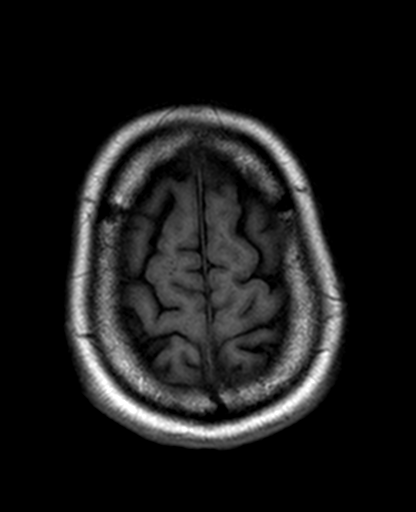
[im 80/80]
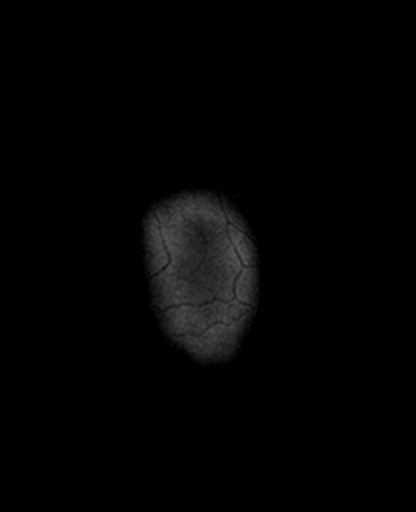

[Series 12: T2 · coronal · 5.0mm · 0.45mm/px · 2 of 22 slices shown (2 of 2)]
[im 1/22]
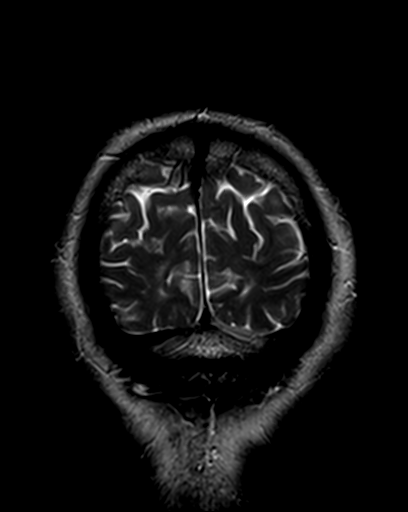
[im 22/22]
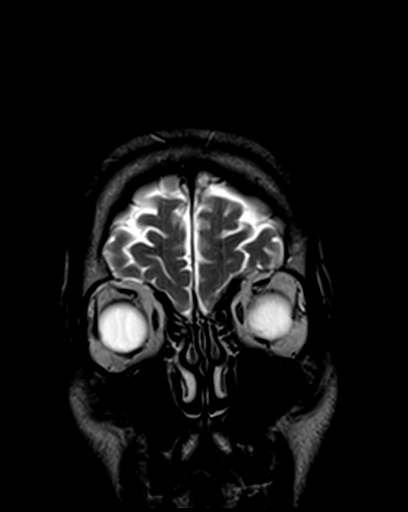

[48 of 48 positions shown; findings below may reference images not displayed]

FINDINGS: No evidence for acute infarction, hemorrhage, mass lesion,
hydrocephalus, or extra-axial fluid. Moderate prominence of the
ventricles, cisterns, and sulci, not unexpected for age. No
disproportionate ventricular enlargement to suggest NPH. Mild to
mild subcortical and periventricular T2 and FLAIR hyperintensities,
likely chronic microvascular ischemic change.

Pituitary, pineal, and cerebellar tonsils unremarkable. No upper
cervical lesions. No occult cerebellar mass or significant chronic
infarct.

Flow voids are maintained throughout the carotid, basilar, and
vertebral arteries. Moderately advanced dolichoectasia. There are no
areas of chronic hemorrhage.

Visualized calvarium, skull base, and upper cervical osseous
structures unremarkable. Scalp and extracranial soft tissues,
orbits, sinuses, and mastoids show no acute process.

Slight progression of atrophy compared with prior CT.
IMPRESSION: No acute intracranial findings.  See discussion above.

Age-related atrophy with mild to moderate small vessel disease.

## 2017-02-20 ENCOUNTER — Other Ambulatory Visit: Payer: Self-pay | Admitting: Internal Medicine

## 2017-03-18 ENCOUNTER — Encounter: Payer: Self-pay | Admitting: Internal Medicine

## 2017-03-18 ENCOUNTER — Ambulatory Visit: Payer: Medicare Other | Admitting: Internal Medicine

## 2017-03-18 VITALS — BP 126/80 | HR 61 | Temp 97.7°F | Resp 14 | Ht 69.0 in | Wt 156.2 lb

## 2017-03-18 DIAGNOSIS — Z0000 Encounter for general adult medical examination without abnormal findings: Secondary | ICD-10-CM

## 2017-03-18 DIAGNOSIS — E782 Mixed hyperlipidemia: Secondary | ICD-10-CM

## 2017-03-18 DIAGNOSIS — E118 Type 2 diabetes mellitus with unspecified complications: Secondary | ICD-10-CM | POA: Diagnosis not present

## 2017-03-18 DIAGNOSIS — N5089 Other specified disorders of the male genital organs: Secondary | ICD-10-CM

## 2017-03-18 MED ORDER — CEPHALEXIN 500 MG PO CAPS: 500.0000 mg | ORAL_CAPSULE | Freq: Four times a day (QID) | ORAL | 0 refills | Status: DC

## 2017-03-18 NOTE — Patient Instructions (Signed)
GO TO THE LAB : Get the blood work     GO TO THE FRONT DESK Schedule your next appointment for a checkup in 6 months  Use your cane consistently  Take antibiotic called cephalexin for 5 days Put OTC hydrocortisone cream on your left arm. Call if you are not improving
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
## 2017-03-18 NOTE — Progress Notes (Signed)
Pre visit review using our clinic review tool, if applicable. No additional management support is needed unless otherwise documented below in the visit note. 

## 2017-03-18 NOTE — Assessment & Plan Note (Addendum)
-  Td 09-2014; pnm 23--2014; prevnar--2015; Shingrix not available , had a Flu shot  -CCS: Last Cscope 02-2011, 2 polyps, saw GI 2- 2018, no further screening  - PSAs per urology  -Labs: CMP, A1c, FLP, CBC -Patient is at high risk for falls, small frame, check a DEXA Diet-exercise discussed  Palpable Ao--- [place] 9-15 neg AAA

## 2017-03-19 LAB — COMPREHENSIVE METABOLIC PANEL
ALBUMIN: 3.9 g/dL (ref 3.5–5.2)
ALT: 16 U/L (ref 0–53)
AST: 24 U/L (ref 0–37)
Alkaline Phosphatase: 85 U/L (ref 39–117)
BUN: 17 mg/dL (ref 6–23)
CHLORIDE: 106 meq/L (ref 96–112)
CO2: 27 meq/L (ref 19–32)
CREATININE: 1.04 mg/dL (ref 0.40–1.50)
Calcium: 9.7 mg/dL (ref 8.4–10.5)
GFR: 87.94 mL/min (ref >60.00)
GLUCOSE: 93 mg/dL (ref 70–99)
Potassium: 4.1 mEq/L (ref 3.5–5.1)
SODIUM: 139 meq/L (ref 135–145)
Total Bilirubin: 0.3 mg/dL (ref 0.2–1.2)
Total Protein: 7.3 g/dL (ref 6.0–8.3)

## 2017-03-19 LAB — CBC WITH DIFFERENTIAL/PLATELET
BASOS PCT: 0.9 % (ref 0.0–3.0)
Basophils Absolute: 0 10*3/uL (ref 0.0–0.1)
EOS PCT: 3.9 % (ref 0.0–5.0)
Eosinophils Absolute: 0.1 10*3/uL (ref 0.0–0.7)
HCT: 39.2 % (ref 39.0–52.0)
HEMOGLOBIN: 12.8 g/dL — AB (ref 13.0–17.0)
Lymphocytes Relative: 19.5 % (ref 12.0–46.0)
Lymphs Abs: 0.6 10*3/uL — ABNORMAL LOW (ref 0.7–4.0)
MCHC: 32.7 g/dL (ref 30.0–36.0)
MCV: 89.8 fl (ref 78.0–100.0)
MONO ABS: 0.3 10*3/uL (ref 0.1–1.0)
Monocytes Relative: 10.4 % (ref 3.0–12.0)
Neutro Abs: 2.1 10*3/uL (ref 1.4–7.7)
Neutrophils Relative %: 65.3 % (ref 43.0–77.0)
Platelets: 106 10*3/uL — ABNORMAL LOW (ref 150.0–400.0)
RBC: 4.36 Mil/uL (ref 4.22–5.81)
RDW: 13.7 % (ref 11.5–15.5)
WBC: 3.2 10*3/uL — AB (ref 4.0–10.5)

## 2017-03-19 LAB — LIPID PANEL
CHOLESTEROL: 141 mg/dL (ref 0–200)
HDL: 54.4 mg/dL (ref >39.00)
LDL Cholesterol: 75 mg/dL (ref 0–99)
NonHDL: 86.27
Total CHOL/HDL Ratio: 3
Triglycerides: 55 mg/dL (ref 0.0–149.0)
VLDL: 11 mg/dL (ref 0.0–40.0)

## 2017-03-19 LAB — HEMOGLOBIN A1C: HEMOGLOBIN A1C: 7.1 % — AB (ref 4.6–6.5)

## 2017-03-19 NOTE — Assessment & Plan Note (Signed)
DM: Currently on Tradjenta, recheck a A1c.  Further advised with results HTN: Currently on losartan.  BP today is very good, no change, checking labs Hyperlipidemia: On Lipitor, will check a cholesterol panel, he is not fasting. Parkinson: To see Dr. Posey Pronto in April, he is at high risk for falls, recommend consistent use of a cane. Dizziness: Chronic, at baseline H/o Constipation, dysphagia: Not an issue at this point. Rash: Insect  bite?  Early cellulitis?.  Rx Keflex and OTC hydrocortisone.  Call if not better. RTC 6 months

## 2017-03-21 MED ORDER — PIOGLITAZONE HCL 30 MG PO TABS: 30.0000 mg | ORAL_TABLET | Freq: Every day | ORAL | 6 refills | Status: DC

## 2017-03-21 NOTE — Addendum Note (Signed)
Addended byDamita Dunnings D on: 03/21/2017 01:17 PM   Modules accepted: Orders

## 2017-03-28 ENCOUNTER — Other Ambulatory Visit: Payer: Self-pay | Admitting: Internal Medicine

## 2017-04-06 ENCOUNTER — Other Ambulatory Visit: Payer: Self-pay | Admitting: Internal Medicine

## 2017-04-09 ENCOUNTER — Encounter: Payer: Self-pay | Admitting: Internal Medicine

## 2017-04-09 ENCOUNTER — Ambulatory Visit (INDEPENDENT_AMBULATORY_CARE_PROVIDER_SITE_OTHER): Payer: Medicare Other | Admitting: Internal Medicine

## 2017-04-09 VITALS — BP 136/70 | HR 68 | Resp 16 | Ht 69.0 in | Wt 158.2 lb

## 2017-04-09 DIAGNOSIS — E118 Type 2 diabetes mellitus with unspecified complications: Secondary | ICD-10-CM | POA: Diagnosis not present

## 2017-04-09 DIAGNOSIS — H6123 Impacted cerumen, bilateral: Secondary | ICD-10-CM | POA: Diagnosis not present

## 2017-04-09 NOTE — Assessment & Plan Note (Signed)
Cerumen impaction: H2 O2 daily at nighttime x a week. Call if not better. DM: Last A1c was 7.1.  On Tradjenta, Actos was added, patient took it one time and then stopped because he read the s/e and he does not like to "risk it".  He is not very interested in any other medication for now thus  will continue Tradjenta. RTC 6 months
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
## 2017-04-09 NOTE — Patient Instructions (Signed)
  Peroxide a few drops on each ear every night for at least 1 week.  Call if not improving

## 2017-04-22 ENCOUNTER — Other Ambulatory Visit: Payer: Self-pay | Admitting: Internal Medicine

## 2017-04-25 ENCOUNTER — Other Ambulatory Visit: Payer: Self-pay | Admitting: Neurology

## 2017-05-01 ENCOUNTER — Encounter: Payer: Self-pay | Admitting: Podiatry

## 2017-05-01 ENCOUNTER — Ambulatory Visit: Payer: Medicare Other | Admitting: Podiatry

## 2017-05-01 DIAGNOSIS — B351 Tinea unguium: Secondary | ICD-10-CM | POA: Diagnosis not present

## 2017-05-01 DIAGNOSIS — M79606 Pain in leg, unspecified: Secondary | ICD-10-CM | POA: Diagnosis not present

## 2017-05-01 DIAGNOSIS — L600 Ingrowing nail: Secondary | ICD-10-CM | POA: Diagnosis not present

## 2017-05-01 NOTE — Patient Instructions (Signed)
Seen for hypertrophic nails. All nails debrided. Return in 3 months or as needed.  

## 2017-05-01 NOTE — Progress Notes (Signed)
Subjective: 84 y.o. year old male patient presents complaining of painful nails. Patient requests toe nails trimmed. Patient is able to walk alone but very slow. He was accompanied by his wife. Denies any new problem.  Objective: Dermatologic: Thick yellow deformed nails x 10. Thick yellow debris under nail plate 1-5 bilateral. Vascular: Pedal pulses are not palpable. No edema or erythema noted. Orthopedic: Contracted lesser digits  Neurologic: All epicritic and tactile sensations grossly intact.  Assessment: Dystrophic mycotic nails x 10. Pain in both great toes.  Treatment: All mycotic nails debrided.  Return in 3 months or as needed.

## 2017-06-04 ENCOUNTER — Ambulatory Visit: Payer: Medicare Other | Admitting: Family Medicine

## 2017-06-04 ENCOUNTER — Ambulatory Visit (HOSPITAL_BASED_OUTPATIENT_CLINIC_OR_DEPARTMENT_OTHER)
Admission: RE | Admit: 2017-06-04 | Discharge: 2017-06-04 | Disposition: A | Discharge status: Home / Self Care | Payer: Medicare Other | Source: Ambulatory Visit | Attending: Internal Medicine | Admitting: Internal Medicine

## 2017-06-04 ENCOUNTER — Encounter: Payer: Self-pay | Admitting: Internal Medicine

## 2017-06-04 ENCOUNTER — Ambulatory Visit: Payer: Medicare Other | Admitting: Internal Medicine

## 2017-06-04 VITALS — BP 142/70 | HR 80 | Temp 97.8°F | Resp 16 | Ht 69.0 in | Wt 155.0 lb

## 2017-06-04 DIAGNOSIS — H6120 Impacted cerumen, unspecified ear: Secondary | ICD-10-CM

## 2017-06-04 DIAGNOSIS — R0602 Shortness of breath: Secondary | ICD-10-CM

## 2017-06-04 DIAGNOSIS — R008 Other abnormalities of heart beat: Secondary | ICD-10-CM

## 2017-06-04 NOTE — Patient Instructions (Signed)
GO TO THE LAB : Get the blood work     GO TO THE FRONT DESK Schedule your next appointment for a checkup in 4 weeks   STOP BY THE FIRST FLOOR:  get the XR   We will schedule echocardiogram and Holter monitor at the cardiology office  If you feel worse, have chest pain, difficulty breathing, leg swelling: Call or go to the ER  Use peroxide on your ears every night
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
## 2017-06-04 NOTE — Progress Notes (Signed)
Pre visit review using our clinic review tool, if applicable. No additional management support is needed unless otherwise documented below in the visit note. 

## 2017-06-05 LAB — CBC WITH DIFFERENTIAL/PLATELET
BASOS ABS: 0 10*3/uL (ref 0.0–0.1)
Basophils Relative: 0.7 % (ref 0.0–3.0)
EOS ABS: 0.1 10*3/uL (ref 0.0–0.7)
Eosinophils Relative: 2.9 % (ref 0.0–5.0)
HCT: 40.4 % (ref 39.0–52.0)
Hemoglobin: 13 g/dL (ref 13.0–17.0)
LYMPHS ABS: 0.8 10*3/uL (ref 0.7–4.0)
LYMPHS PCT: 17.4 % (ref 12.0–46.0)
MCHC: 32.3 g/dL (ref 30.0–36.0)
MCV: 91.1 fl (ref 78.0–100.0)
Monocytes Absolute: 0.6 10*3/uL (ref 0.1–1.0)
Monocytes Relative: 12.2 % — ABNORMAL HIGH (ref 3.0–12.0)
NEUTROS ABS: 3.1 10*3/uL (ref 1.4–7.7)
NEUTROS PCT: 66.8 % (ref 43.0–77.0)
PLATELETS: 89 10*3/uL — AB (ref 150.0–400.0)
RBC: 4.43 Mil/uL (ref 4.22–5.81)
RDW: 14.1 % (ref 11.5–15.5)
WBC: 4.7 10*3/uL (ref 4.0–10.5)

## 2017-06-05 LAB — BASIC METABOLIC PANEL
BUN: 19 mg/dL (ref 6–23)
CALCIUM: 9.2 mg/dL (ref 8.4–10.5)
CHLORIDE: 107 meq/L (ref 96–112)
CO2: 24 mEq/L (ref 19–32)
Creatinine, Ser: 1.1 mg/dL (ref 0.40–1.50)
GFR: 82.38 mL/min (ref >60.00)
Glucose, Bld: 98 mg/dL (ref 70–99)
POTASSIUM: 4.2 meq/L (ref 3.5–5.1)
SODIUM: 142 meq/L (ref 135–145)

## 2017-06-05 LAB — MAGNESIUM: Magnesium: 1.9 mg/dL (ref 1.5–2.5)

## 2017-06-05 LAB — TSH: TSH: 0.86 u[IU]/mL (ref 0.35–4.50)

## 2017-06-05 NOTE — Assessment & Plan Note (Signed)
Trigeminy Patient presents with "anxiety" and difficulty breathing early in the morning, symptoms get better as soon as he sits on his bed (?orthopnea).  Although he denies chest pain he reports "something different in the chest".   EKG showed trigeminy.  Suspect he might have symptomatic trigeminy.  No evidence of volume overload on exam today.  Case discussed with cardiology DOD and I appreciate her help. Plan: BMP, CBC, magnesium, TSH, chest x-ray, echo, Holter (definitely will need further cardiac evaluation if the trigeminy burden is more than 10%). Patient will call if he has any worsening  Cerumen impaction: rec H2O2 RTC 4 weeks

## 2017-06-12 ENCOUNTER — Other Ambulatory Visit: Payer: Self-pay

## 2017-06-12 ENCOUNTER — Ambulatory Visit (HOSPITAL_COMMUNITY): Payer: Medicare Other | Attending: Cardiology

## 2017-06-12 DIAGNOSIS — E785 Hyperlipidemia, unspecified: Secondary | ICD-10-CM | POA: Insufficient documentation

## 2017-06-12 DIAGNOSIS — C61 Malignant neoplasm of prostate: Secondary | ICD-10-CM | POA: Insufficient documentation

## 2017-06-12 DIAGNOSIS — I10 Essential (primary) hypertension: Secondary | ICD-10-CM | POA: Insufficient documentation

## 2017-06-12 DIAGNOSIS — Z87891 Personal history of nicotine dependence: Secondary | ICD-10-CM | POA: Insufficient documentation

## 2017-06-12 DIAGNOSIS — E119 Type 2 diabetes mellitus without complications: Secondary | ICD-10-CM | POA: Insufficient documentation

## 2017-06-12 DIAGNOSIS — R008 Other abnormalities of heart beat: Secondary | ICD-10-CM | POA: Diagnosis not present

## 2017-06-12 DIAGNOSIS — T148XXS Other injury of unspecified body region, sequela: Secondary | ICD-10-CM | POA: Diagnosis not present

## 2017-06-12 DIAGNOSIS — Z8249 Family history of ischemic heart disease and other diseases of the circulatory system: Secondary | ICD-10-CM | POA: Diagnosis not present

## 2017-06-26 ENCOUNTER — Ambulatory Visit: Payer: Medicare Other | Admitting: Neurology

## 2017-06-26 ENCOUNTER — Encounter: Payer: Self-pay | Admitting: Neurology

## 2017-06-26 VITALS — BP 130/80 | HR 86 | Ht 69.0 in | Wt 152.0 lb

## 2017-06-26 DIAGNOSIS — R2689 Other abnormalities of gait and mobility: Secondary | ICD-10-CM

## 2017-06-26 DIAGNOSIS — G20 Parkinson's disease: Secondary | ICD-10-CM

## 2017-06-26 DIAGNOSIS — F419 Anxiety disorder, unspecified: Secondary | ICD-10-CM

## 2017-06-26 NOTE — Progress Notes (Signed)
Follow-up Visit   Date: 06/26/17    Dylan Orozco MRN: [id number] DOB: 08-Apr-1933   Interim History: Dylan Orozco is a 84 y.o. right-handed African American male with hypertension, diabetes mellitus type II, glaucoma with left eye blindness, history or prostate cancer, hyperlipidemia returning to the clinic for follow-up of idiopathic parkinson's disease.  The patient was accompanied to the clinic by self.   History of present illness: Starting since June 2015, patient has had 20lb unintentional weight loss which he initially attributed to metformin causing reduced appetite and weight loss. Since stopping this medication, he has gained 2lb. He also complains of frequent choking spells with solids, moreso than liquids. He has noticed that his voice has becomes slower and lower. His movements are slower. Denies problems with sense of smell, vivid dreams or constipation. He feels that he is stooped when walking. His hand writing has become much smaller and not as neat as it previously was. Denies any tremor.  Walking is much slower and he is shuffling his feet.   He also complains of mild memory problems and word finding difficulty. He is managing his own finances. He does not drive.   He underwent modified barium swallow which showed moderate pharyngreal and esophageal dysphagia. He was evaluated by Dr. Fuller Plan for the same complaints who agreed with neurology consultation recommended by his PCP for dysphagia.  He was started on sinemet in 2016 25/100 1 tab TID which improved generalized stiffness and walking.    UPDATE 06/26/2017:  He is here for follow-up visit and was last seen in June 2018.  He remains on sinemet 25/100 1 tab TID which he takes at 9am, 3pm, and 9pm.  Over the past year, he feels that movements have not slowed, but he continues to have shuffling gait.  Over the last week, he has started to walk to his club house and back, which is was doing more  regularly in the past.  He has not suffered any falls and walks unassisted.  No problems with swallowing.  Voice remains soft.  He also complains of generalized sensation of anxiety and worried about the tasks that he needs to complete.  For instance, he tries to rush through activities and gets nervous when he is behind or unable to keep up. He wakes up anxious and does not have specific trigger.  He saw his PCP for this who noted EKG with trigeminy, echo is normal. He has an appointment next week to discuss this.    Medications:  Current Outpatient Medications on File Prior to Visit  Medication Sig Dispense Refill  . aspirin 81 MG tablet Take 81 mg by mouth daily.    Marland Kitchen atorvastatin (LIPITOR) 20 MG tablet Take 1 tablet (20 mg total) by mouth daily. 30 tablet 6  . brimonidine (ALPHAGAN P) 0.1 % SOLN PLACE 1 DROP INTO THE RIGHT EYE 3 TIMES DAILY.    . carbidopa-levodopa (SINEMET IR) 25-100 MG tablet TAKE 1 TABLET BY MOUTH 3 TIMES A DAY 270 tablet 3  . cephALEXin (KEFLEX) 500 MG capsule Take 1 capsule (500 mg total) by mouth 4 (four) times daily. 20 capsule 0  . dorzolamide (TRUSOPT) 2 % ophthalmic solution PLACE 1 DROP INTO THE RIGHT EYE 2 TIMES DAILY.  0  . latanoprost (XALATAN) 0.005 % ophthalmic solution PLACE 1 DROP INTO THE RIGHT EYE NIGHTLY.    . linagliptin (TRADJENTA) 5 MG TABS tablet Take 1 tablet (5 mg total) by mouth daily. [place]  tablet 1  . losartan (COZAAR) 50 MG tablet TAKE 2 TABLETS BY MOUTH EVERY DAY 60 tablet 5  . Multiple Vitamin (MULTIVITAMIN WITH MINERALS) TABS Take 1 tablet by mouth daily.    . timolol (TIMOPTIC) 0.5 % ophthalmic solution PLACE 1 DROP INTO THE RIGHT EYE 2 TIMES DAILY.  0   No current facility-administered medications on file prior to visit.     Allergies:  Allergies  Allergen Reactions  . Glimepiride     REACTION: ? UPSET STOMACH  . Pravastatin     Constipation  . Ramipril      dizziness    Review of Systems:  CONSTITUTIONAL: No fevers, chills, night  sweats, or weight loss.  EYES: No visual changes or eye pain ENT: No hearing changes.  No history of nose bleeds.   RESPIRATORY: No cough, wheezing and shortness of breath.   CARDIOVASCULAR: Negative for chest pain, and palpitations.   GI: Negative for abdominal discomfort, blood in stools or black stools.  No recent change in bowel habits.   GU:  No history of incontinence.   MUSCLOSKELETAL: No history of joint pain or swelling.  No myalgias.   SKIN: Negative for lesions, rash, and itching.   ENDOCRINE: Negative for cold or heat intolerance, polydipsia or goiter.   PSYCH:  No depression +anxiety symptoms.   NEURO: As Above.   Vital Signs:  BP 130/80   Pulse 86   Ht 5\' 9"  (1.753 m)   Wt 152 lb (68.9 kg)   SpO2 99%   BMI 22.45 kg/m   General Medical Exam:   General:  Well appearing, comfortable  Eyes/ENT: see cranial nerve examination.   Neck: No masses appreciated.  Full range of motion without tenderness.  No carotid bruits. Respiratory:  Clear to auscultation, good air entry bilaterally.   Cardiac:  Irregular rate and rhythm Ext:  No edema    Neurological Exam: MENTAL STATUS:  Awake, severely blunted affect, smiles appropriately.  Oriented to person, place, and time.  Speech mildly hypophonic and slow (stable), no dysarthria.  CRANIAL NERVES:  Left eye is blind with mild ptosis.  Poor blink.  Pupils equal round and reactive to light.  Normal conjugate, extra-ocular eye movements in all directions of gaze.  Face is symmetric.    MOTOR:  Motor strength is 5/5 in all extremities.  There is mild rigidity of the upper extremities.     COORDINATION/GAIT: Finger and heel tapping shows severely reduced amplitude, normal rate.  Gait shows mildly stooped posture with shuffling steps (worse).   Data: MRI brain wo contrast 08/28/2014:  No acute intracranial findings. Age-related atrophy with mild to moderate small vessel disease.  Labs 08/25/2014:  Vitamin B12 1435, copper 110, zinc  68  IMPRESSION/PLAN: 1.  Parkinsonism, predominately akinetic-rigid features (LUE rigidity, bradykinesia, hypophonia, shuffling gait).  Exam is relatively stable, except that his gait show more shuffling.  I will adjust his sinemet so that he is taking this during active hours of the day as follows:  Take 1 tablet sinemet 25/100 at 9am, 1pm, and 5pm.  If he tolerate this well, I will plan to titrate this to 1.5mg  TID.  Recommend starting home PT for gait training.  He does not drive due to left eye blindness. Encouraged to try to remain active  2.  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  Stable.   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  Anxiety - new.  He will talk to Dr. Larose Kells about this next week and seems interested in starting medications.   Return to clinic in 3 months  Greater than 50% of this 30 minute visit was spent in counseling, explanation of diagnosis, planning of further management, and coordination of care.    Thank you for allowing me to participate in patient's care.  If I can answer any additional questions, I would be pleased to do so.    Sincerely,    Donika K. Posey Pronto, DO

## 2017-06-26 NOTE — Patient Instructions (Addendum)
Start taking sinemet 25/100 at 9am, 1pm, and 5pm.  Home physical therapy   Return to clinic in 4 months

## 2017-07-02 ENCOUNTER — Ambulatory Visit: Payer: Medicare Other | Admitting: Internal Medicine

## 2017-07-02 ENCOUNTER — Encounter: Payer: Self-pay | Admitting: Internal Medicine

## 2017-07-02 VITALS — BP 128/60 | HR 67 | Temp 97.9°F | Resp 16 | Ht 69.0 in | Wt 152.6 lb

## 2017-07-02 DIAGNOSIS — R008 Other abnormalities of heart beat: Secondary | ICD-10-CM

## 2017-07-02 DIAGNOSIS — F419 Anxiety disorder, unspecified: Secondary | ICD-10-CM

## 2017-07-02 MED ORDER — ESCITALOPRAM OXALATE 5 MG PO TABS: 5.0000 mg | ORAL_TABLET | Freq: Every day | ORAL | 1 refills | Status: DC

## 2017-07-02 NOTE — Progress Notes (Signed)
Pre visit review using our clinic review tool, if applicable. No additional management support is needed unless otherwise documented below in the visit note. 

## 2017-07-02 NOTE — Assessment & Plan Note (Signed)
Trigeminy: Continue to be on trigeminy on exam, asymptomatic, recent labs and echocardiogram normal.  Holter was not done.  Will reschedule a Holter.  In addition, will refer to cardiology, suspect that the trigeminy burden will be more than 10%. Anxiety: As described above, denies depression, the patient wonder if Vistaril is a good idea but  that will be very sedative.  Recommend low-dose of Lexapro, aware that needs to be taken daily and he will take few days to a couple of weeks to "kick in". RTC 6 weeks for anxiety reassessment

## 2017-07-02 NOTE — Patient Instructions (Addendum)
GO TO THE FRONT DESK Schedule your next appointment for a checkup in 6 weeks  Start escitalopram  5 mg 1 tablet daily.  Will help with anxiety.  If you do not hear from the cardiology office about the Holter monitor in the next 2 to 3 days, please call the office
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
## 2017-07-09 ENCOUNTER — Ambulatory Visit (INDEPENDENT_AMBULATORY_CARE_PROVIDER_SITE_OTHER): Payer: Medicare Other

## 2017-07-09 DIAGNOSIS — I493 Ventricular premature depolarization: Secondary | ICD-10-CM

## 2017-07-09 DIAGNOSIS — R008 Other abnormalities of heart beat: Secondary | ICD-10-CM | POA: Diagnosis not present

## 2017-07-10 ENCOUNTER — Other Ambulatory Visit: Payer: Self-pay | Admitting: Internal Medicine

## 2017-07-10 DIAGNOSIS — R008 Other abnormalities of heart beat: Secondary | ICD-10-CM

## 2017-07-10 DIAGNOSIS — I493 Ventricular premature depolarization: Secondary | ICD-10-CM

## 2017-07-16 ENCOUNTER — Encounter: Payer: Self-pay | Admitting: Cardiology

## 2017-07-16 ENCOUNTER — Ambulatory Visit: Payer: Medicare Other | Admitting: Cardiology

## 2017-07-16 VITALS — BP 138/82 | HR 71 | Ht 69.0 in | Wt 150.0 lb

## 2017-07-16 DIAGNOSIS — R0789 Other chest pain: Secondary | ICD-10-CM | POA: Diagnosis not present

## 2017-07-16 DIAGNOSIS — I10 Essential (primary) hypertension: Secondary | ICD-10-CM

## 2017-07-16 DIAGNOSIS — E088 Diabetes mellitus due to underlying condition with unspecified complications: Secondary | ICD-10-CM

## 2017-07-16 NOTE — Patient Instructions (Signed)
Medication Instructions:  Your physician recommends that you continue on your current medications as directed. Please refer to the Current Medication list given to you today.  Labwork: None  Testing/Procedures: Your physician has requested that you have an echocardiogram. Echocardiography is a painless test that uses sound waves to create images of your heart. It provides your doctor with information about the size and shape of your heart and how well your heart's chambers and valves are working. This procedure takes approximately one hour. There are no restrictions for this procedure.  Your physician has requested that you have a lexiscan myoview. For further information please visit [email or web address]. Please follow instruction sheet, as given.  Follow-Up: Your physician recommends that you schedule a follow-up appointment in: 3 months  Any Other Special Instructions Will Be Listed Below (If Applicable).     If you need a refill on your cardiac medications before your next appointment, please call your pharmacy.   Luana, RN, BSN  Echocardiogram An echocardiogram, or echocardiography, uses sound waves (ultrasound) to produce an image of your heart. The echocardiogram is simple, painless, obtained within a short period of time, and offers valuable information to your health care provider. The images from an echocardiogram can provide information such as:  Evidence of coronary artery disease (CAD).  Heart size.  Heart muscle function.  Heart valve function.  Aneurysm detection.  Evidence of a past heart attack.  Fluid buildup around the heart.  Heart muscle thickening.  Assess heart valve function.  Tell a health care provider about:  Any allergies you have.  All medicines you are taking, including vitamins, herbs, eye drops, creams, and over-the-counter medicines.  Any problems you or family members have had with anesthetic medicines.  Any  blood disorders you have.  Any surgeries you have had.  Any medical conditions you have.  Whether you are pregnant or may be pregnant. What happens before the procedure? No special preparation is needed. Eat and drink normally. What happens during the procedure?  In order to produce an image of your heart, gel will be applied to your chest and a wand-like tool (transducer) will be moved over your chest. The gel will help transmit the sound waves from the transducer. The sound waves will harmlessly bounce off your heart to allow the heart images to be captured in real-time motion. These images will then be recorded.  You may need an IV to receive a medicine that improves the quality of the pictures. What happens after the procedure? You may return to your normal schedule including diet, activities, and medicines, unless your health care provider tells you otherwise. This information is not intended to replace advice given to you by your health care provider. Make sure you discuss any questions you have with your health care provider. Document Released: 01/06/2000 Document Revised: 08/27/2015 Document Reviewed: 09/15/2012 Elsevier Interactive Patient Education  2017 [employer].
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
## 2017-07-22 ENCOUNTER — Telehealth (HOSPITAL_COMMUNITY): Payer: Self-pay | Admitting: *Deleted

## 2017-07-22 NOTE — Telephone Encounter (Signed)
Patient given detailed instructions per Myocardial Perfusion Study Information Sheet for the test on 07/24/17 at 1000. Patient notified to arrive 15 minutes early and that it is imperative to arrive on time for appointment to keep from having the test rescheduled.  If you need to cancel or reschedule your appointment, please call the office within 24 hours of your appointment. . Patient verbalized understanding.Dylan Orozco, Ranae Palms

## 2017-07-24 ENCOUNTER — Other Ambulatory Visit: Payer: Self-pay | Admitting: Internal Medicine

## 2017-07-24 ENCOUNTER — Ambulatory Visit (HOSPITAL_COMMUNITY): Payer: Medicare Other | Attending: Internal Medicine

## 2017-07-24 ENCOUNTER — Other Ambulatory Visit: Payer: Self-pay

## 2017-07-24 ENCOUNTER — Ambulatory Visit (HOSPITAL_BASED_OUTPATIENT_CLINIC_OR_DEPARTMENT_OTHER): Payer: Medicare Other

## 2017-07-24 VITALS — Ht 69.0 in | Wt 150.0 lb

## 2017-07-24 DIAGNOSIS — I351 Nonrheumatic aortic (valve) insufficiency: Secondary | ICD-10-CM | POA: Insufficient documentation

## 2017-07-24 DIAGNOSIS — E785 Hyperlipidemia, unspecified: Secondary | ICD-10-CM | POA: Diagnosis not present

## 2017-07-24 DIAGNOSIS — R011 Cardiac murmur, unspecified: Secondary | ICD-10-CM | POA: Insufficient documentation

## 2017-07-24 DIAGNOSIS — R0789 Other chest pain: Secondary | ICD-10-CM | POA: Diagnosis not present

## 2017-07-24 DIAGNOSIS — E119 Type 2 diabetes mellitus without complications: Secondary | ICD-10-CM | POA: Insufficient documentation

## 2017-07-24 DIAGNOSIS — I10 Essential (primary) hypertension: Secondary | ICD-10-CM | POA: Diagnosis not present

## 2017-07-24 LAB — MYOCARDIAL PERFUSION IMAGING
CHL CUP NUCLEAR SRS: 10
CHL CUP RESTING HR STRESS: 71 {beats}/min
NUC STRESS TID: 1.21
Peak HR: 88 {beats}/min
RATE: 0.24
SDS: 3
SSS: 13

## 2017-07-24 MED ORDER — REGADENOSON 0.4 MG/5ML IV SOLN
0.4000 mg | Freq: Once | INTRAVENOUS | Status: AC
  Administered 2017-07-24: 0.4 mg via INTRAVENOUS

## 2017-07-24 MED ORDER — TECHNETIUM TC 99M TETROFOSMIN IV KIT
10.2000 | PACK | Freq: Once | INTRAVENOUS | Status: AC | PRN
  Administered 2017-07-24: 10.2 via INTRAVENOUS
  Filled 2017-07-24: qty 11

## 2017-07-24 MED ORDER — TECHNETIUM TC 99M TETROFOSMIN IV KIT
31.9000 | PACK | Freq: Once | INTRAVENOUS | Status: AC | PRN
  Administered 2017-07-24: 31.9 via INTRAVENOUS
  Filled 2017-07-24: qty 32

## 2017-07-26 ENCOUNTER — Encounter: Payer: Self-pay | Admitting: *Deleted

## 2017-08-20 ENCOUNTER — Ambulatory Visit: Payer: Medicare Other | Admitting: Internal Medicine

## 2017-08-20 ENCOUNTER — Encounter: Payer: Self-pay | Admitting: Internal Medicine

## 2017-08-20 VITALS — BP 126/82 | HR 67 | Temp 98.1°F | Resp 16 | Ht 69.0 in | Wt 153.5 lb

## 2017-08-20 DIAGNOSIS — F419 Anxiety disorder, unspecified: Secondary | ICD-10-CM | POA: Diagnosis not present

## 2017-08-20 DIAGNOSIS — R008 Other abnormalities of heart beat: Secondary | ICD-10-CM | POA: Diagnosis not present

## 2017-08-20 DIAGNOSIS — E118 Type 2 diabetes mellitus with unspecified complications: Secondary | ICD-10-CM

## 2017-08-20 LAB — HEMOGLOBIN A1C: Hgb A1c MFr Bld: 7.3 % — ABNORMAL HIGH (ref 4.6–6.5)

## 2017-08-20 NOTE — Progress Notes (Signed)
Pre visit review using our clinic review tool, if applicable. No additional management support is needed unless otherwise documented below in the visit note. 
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
## 2017-08-20 NOTE — Patient Instructions (Addendum)
GO TO THE LAB : Get the blood work     GO TO THE FRONT DESK Schedule your next appointment   for a checkup in 4 to 5 months     

## 2017-08-21 DIAGNOSIS — R008 Other abnormalities of heart beat: Secondary | ICD-10-CM | POA: Insufficient documentation

## 2017-08-21 NOTE — Assessment & Plan Note (Signed)
PLAN DM: On Tradjenta, last A1c 7.1, will recheck, A1c goal around 7.  If closer to 8.0  Consider add a medication. Trigeminy: s/p echo, Holter, Myoview: All okay, saw cardiology.  Was recommended observation. Parkinson's: Minimal tremor today, gait is however abnormal, he also reports chronic dizziness.  Strongly encouraged to use a cane. Anxiety : Started Lexapro, low-dose, feeling much better.  RF when  needed RTC 4 to 5 months

## 2017-09-20 ENCOUNTER — Ambulatory Visit: Payer: Medicare Other | Admitting: Internal Medicine

## 2017-09-20 ENCOUNTER — Encounter: Payer: Self-pay | Admitting: Internal Medicine

## 2017-09-20 VITALS — BP 140/82 | HR 68 | Temp 97.8°F | Resp 16 | Ht 69.0 in | Wt 149.6 lb

## 2017-09-20 DIAGNOSIS — T7840XA Allergy, unspecified, initial encounter: Secondary | ICD-10-CM

## 2017-09-20 MED ORDER — HYDROXYZINE HCL 10 MG PO TABS: 10.0000 mg | ORAL_TABLET | Freq: Three times a day (TID) | ORAL | 0 refills | Status: DC | PRN

## 2017-09-20 NOTE — Patient Instructions (Signed)
Get over-the-counter Aveeno lotion, use it as needed to keep your skin moisturized  Atarax, take it up to 3 times a day as needed for itching.  Will cause drowsiness, please be careful.  Call or go to the ER if: Severe rash, fever, chills, more blisters
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
## 2017-09-22 NOTE — Assessment & Plan Note (Signed)
Allergic reaction?  Etiology unclear, he does have blisters but they are not consistent with a herpetic infection, no systemic symptoms other than question of chills. Plan: Symptomatic treatment with Aveeno lotion, Atarax as needed, warned about oversedation.  If he has fever, chills, generalized rash, or other serious problems needs to go to the ER during the weekend.

## 2017-09-23 ENCOUNTER — Emergency Department (HOSPITAL_COMMUNITY)
Admission: EM | Admit: 2017-09-23 | Discharge: 2017-09-23 | Disposition: A | Discharge status: Home / Self Care | Payer: Medicare Other | Attending: Emergency Medicine | Admitting: Emergency Medicine

## 2017-09-23 ENCOUNTER — Emergency Department (HOSPITAL_COMMUNITY): Payer: Medicare Other

## 2017-09-23 ENCOUNTER — Encounter (HOSPITAL_COMMUNITY): Payer: Self-pay | Admitting: Emergency Medicine

## 2017-09-23 ENCOUNTER — Other Ambulatory Visit: Payer: Self-pay

## 2017-09-23 DIAGNOSIS — K6289 Other specified diseases of anus and rectum: Secondary | ICD-10-CM | POA: Diagnosis not present

## 2017-09-23 DIAGNOSIS — I10 Essential (primary) hypertension: Secondary | ICD-10-CM | POA: Diagnosis not present

## 2017-09-23 DIAGNOSIS — R319 Hematuria, unspecified: Secondary | ICD-10-CM | POA: Diagnosis present

## 2017-09-23 DIAGNOSIS — N329 Bladder disorder, unspecified: Secondary | ICD-10-CM | POA: Insufficient documentation

## 2017-09-23 DIAGNOSIS — R310 Gross hematuria: Secondary | ICD-10-CM | POA: Insufficient documentation

## 2017-09-23 DIAGNOSIS — Z7982 Long term (current) use of aspirin: Secondary | ICD-10-CM | POA: Diagnosis not present

## 2017-09-23 DIAGNOSIS — I77811 Abdominal aortic ectasia: Secondary | ICD-10-CM | POA: Diagnosis not present

## 2017-09-23 DIAGNOSIS — G20 Parkinson's disease: Secondary | ICD-10-CM | POA: Diagnosis not present

## 2017-09-23 DIAGNOSIS — E119 Type 2 diabetes mellitus without complications: Secondary | ICD-10-CM | POA: Insufficient documentation

## 2017-09-23 DIAGNOSIS — N3289 Other specified disorders of bladder: Secondary | ICD-10-CM

## 2017-09-23 DIAGNOSIS — Z7984 Long term (current) use of oral hypoglycemic drugs: Secondary | ICD-10-CM | POA: Diagnosis not present

## 2017-09-23 DIAGNOSIS — Z87891 Personal history of nicotine dependence: Secondary | ICD-10-CM | POA: Insufficient documentation

## 2017-09-23 DIAGNOSIS — K629 Disease of anus and rectum, unspecified: Secondary | ICD-10-CM

## 2017-09-23 DIAGNOSIS — Z8546 Personal history of malignant neoplasm of prostate: Secondary | ICD-10-CM | POA: Diagnosis not present

## 2017-09-23 DIAGNOSIS — Z79899 Other long term (current) drug therapy: Secondary | ICD-10-CM | POA: Diagnosis not present

## 2017-09-23 LAB — URINALYSIS, ROUTINE W REFLEX MICROSCOPIC

## 2017-09-23 LAB — URINALYSIS, MICROSCOPIC (REFLEX): Squamous Epithelial / LPF: NONE SEEN (ref 0–5)

## 2017-09-23 LAB — BASIC METABOLIC PANEL
ANION GAP: 9 (ref 5–15)
BUN: 22 mg/dL (ref 8–23)
CALCIUM: 9.2 mg/dL (ref 8.9–10.3)
CO2: 23 mmol/L (ref 22–32)
CREATININE: 1.16 mg/dL (ref 0.61–1.24)
Chloride: 109 mmol/L (ref 98–111)
GFR, EST NON AFRICAN AMERICAN: 57 mL/min — AB (ref >60)
Glucose, Bld: 274 mg/dL — ABNORMAL HIGH (ref 70–99)
Potassium: 3.7 mmol/L (ref 3.5–5.1)
SODIUM: 141 mmol/L (ref 135–145)

## 2017-09-23 LAB — CBC WITH DIFFERENTIAL/PLATELET
Abs Immature Granulocytes: 0 10*3/uL (ref 0.0–0.1)
Basophils Absolute: 0 10*3/uL (ref 0.0–0.1)
Basophils Relative: 0 %
EOS ABS: 0 10*3/uL (ref 0.0–0.7)
Eosinophils Relative: 0 %
HEMATOCRIT: 40.9 % (ref 39.0–52.0)
Hemoglobin: 12.8 g/dL — ABNORMAL LOW (ref 13.0–17.0)
IMMATURE GRANULOCYTES: 0 %
Lymphocytes Relative: 4 %
Lymphs Abs: 0.4 10*3/uL — ABNORMAL LOW (ref 0.7–4.0)
MCH: 29.2 pg (ref 26.0–34.0)
MCHC: 31.3 g/dL (ref 30.0–36.0)
MCV: 93.2 fL (ref 78.0–100.0)
Monocytes Absolute: 0.7 10*3/uL (ref 0.1–1.0)
Monocytes Relative: 6 %
NEUTROS PCT: 90 %
Neutro Abs: 9.5 10*3/uL — ABNORMAL HIGH (ref 1.7–7.7)
Platelets: 101 10*3/uL — ABNORMAL LOW (ref 150–400)
RBC: 4.39 MIL/uL (ref 4.22–5.81)
RDW: 13 % (ref 11.5–15.5)
WBC: 10.6 10*3/uL — AB (ref 4.0–10.5)

## 2017-09-23 MED ORDER — CEPHALEXIN 250 MG PO CAPS
500.0000 mg | ORAL_CAPSULE | Freq: Once | ORAL | Status: AC
  Administered 2017-09-23: 500 mg via ORAL
  Filled 2017-09-23: qty 2

## 2017-09-23 MED ORDER — CEPHALEXIN 500 MG PO CAPS: 500.0000 mg | ORAL_CAPSULE | Freq: Two times a day (BID) | ORAL | 0 refills | Status: AC

## 2017-09-23 NOTE — ED Notes (Signed)
Condom cath removed.  Provider bedside to review the discharge instructions and results for tests.  Pt agrees to follow up w/ discharge instructions.

## 2017-09-23 NOTE — ED Triage Notes (Signed)
Patient to ED c/o new onset hematuria x 2 hours ago as well as constipation (states he has been straining recently). Reports bright red blood in urine. Not on blood thinners. Denies pain or other new urinary symptoms.

## 2017-09-23 NOTE — Discharge Instructions (Addendum)
Please make sure that you are staying well-hydrated.  This is important to keep you urinating frequently with the blood in your urine.  Your CT scan showed concern for a mass inside your bladder and around your rectum.  You need to call both urology and your primary care doctor tomorrow morning first thing to schedule an appointment.  If you become unable to pee, have worsening symptoms please go to [facility] long emergency room.  The urologists are primarily at [place] long.    Your aorta (the large vessel that comes from your heart) is wide below your kidneys.  You will need a follow up ultrasound in 5 years to re-evaluate it.   You may have diarrhea from the antibiotics.  It is very important that you continue to take the antibiotics even if you get diarrhea unless a medical professional tells you that you may stop taking them.  If you stop too early the bacteria you are being treated for will become stronger and you may need different, more powerful antibiotics that have more side effects and worsening diarrhea.  Please stay well hydrated and consider probiotics as they may decrease the severity of your diarrhea.
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
## 2017-09-23 NOTE — ED Notes (Signed)
Pt verbalized understanding of discharge instructions.  Follow up instructions reviewed three times w/ pt and highlighted on papers.  Pt agrees to follow up w/ providers.
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
## 2017-09-24 ENCOUNTER — Telehealth: Payer: Self-pay

## 2017-09-24 ENCOUNTER — Telehealth: Payer: Self-pay | Admitting: Internal Medicine

## 2017-09-24 ENCOUNTER — Ambulatory Visit: Payer: Self-pay | Admitting: Internal Medicine

## 2017-09-24 NOTE — Telephone Encounter (Signed)
-----   Message from Ladene Artist, MD sent at 09/24/2017  9:35 AM EDT ----- Regarding: RE: colonoscopy? Hi Dylan Orozco, I reviewed the CT. We will set him up for colonoscopy. Thank you, Norberto Sorenson    ----- Message ----- From: Colon Branch, MD Sent: 09/24/2017   9:09 AM EDT To: Ladene Artist, MD, Colon Branch, MD Subject: colonoscopy?                                   Norberto Sorenson, this gentleman went to the ER with hematuria, a CAT scan showed a bladder mass arising from possibly  a rectal primary malignancy.   I wonder if you would see this patient for possibly colonoscopy and biopsy based on the CAT scan report.  Thank you for your help, let me know. JP

## 2017-09-24 NOTE — Telephone Encounter (Signed)
Patient has been scheduled for a colon and pre-visit for 9/10 and 10/08/17 with Dr. Fuller Plan

## 2017-09-24 NOTE — Telephone Encounter (Signed)
Wife, Dylan Orozco called in c/o husband having "a lot of blood in his urine".    "He went to the hospital yesterday for the same thing and was told he had a "mass".   He is in the bathroom now bleeding.   When I asked if he was dizzy or weak she replied,   "He is weak".  I instructed her to call 911.    Due to the bleeding he needs to return to the ED.   She verbalized understanding and is going to call 911 now.  I routed a note to Dr. Larose Kells making him aware of the situation.  Reason for Disposition . Passing pure blood or large blood clots (i.e., size > a dime) (Exception: fleck or small strands)  Answer Assessment - Initial Assessment Questions 1. COLOR of URINE: "Describe the color of the urine."  (e.g., tea-colored, pink, red, blood clots, bloody)     Yesterday took him to ED due to a lot of blood in his urine.  Wife Dylan Orozco on the phone.    Released last night 10:00PM.    This morning there is blood in the bed, and on the floor.   He is in the bathroom bleeding now.   They said he had a "mass" in the ED. 2. ONSET: "When did the bleeding start?"      He is in the bathroom bleeding now. 3. EPISODES: "How many times has there been blood in the urine?" or "How many times today?"     A lot of blood in his urine. 4. PAIN with URINATION: "Is there any pain with passing your urine?" If so, ask: "How bad is the pain?"  (Scale 1-10; or mild, moderate, severe)    - MILD - complains slightly about urination hurting    - MODERATE - interferes with normal activities      - SEVERE - excruciating, unwilling or unable to urinate because of the pain      Not asked.   I instructed her to call 911 due to him being weak. 5. FEVER: "Do you have a fever?" If so, ask: "What is your temperature, how was it measured, and when did it start?"     Not asked 6. ASSOCIATED SYMPTOMS: "Are you passing urine more frequently than usual?"     In bathroom now bleeding. 7. OTHER SYMPTOMS: "Do you have any other symptoms?" (e.g.,  back/flank pain, abdominal pain, vomiting)     Not asked 8. PREGNANCY: "Is there any chance you are pregnant?" "When was your last menstrual period?"     N/A  Protocols used: URINE - BLOOD IN-A-AH

## 2017-09-24 NOTE — Telephone Encounter (Signed)
Pt went to the ER yesterday, note reviewed, have gross hematuria,  CAT scan worrisome for a primary rectal malignancy. I contacted GI, they are planning a colonoscopy. Please call patient: Expect that GI phone call, set up an appointment to see me for a ER follow-up within the next couple of weeks, sooner if needed.

## 2017-09-24 NOTE — Telephone Encounter (Signed)
Noted, thx.

## 2017-09-25 LAB — URINE CULTURE: CULTURE: NO GROWTH

## 2017-09-25 NOTE — Telephone Encounter (Signed)
Patient aware he has appointment with Dr. Fuller Plan at [facility] on 10-08-17

## 2017-09-26 NOTE — Telephone Encounter (Signed)
Pt called back about making an hospital f/u he states that he cant come in none next week and that he will call to schedule appt after his two doctor appts next week

## 2017-09-26 NOTE — Telephone Encounter (Signed)
Spoke with patient. Scheduled follow up appointment for 10/14/17.

## 2017-09-26 NOTE — Telephone Encounter (Signed)
Called patient today to schedule ED follow up. Left message for return call.

## 2017-09-29 ENCOUNTER — Other Ambulatory Visit: Payer: Self-pay | Admitting: Internal Medicine

## 2017-10-01 ENCOUNTER — Encounter: Payer: Self-pay | Admitting: Gastroenterology

## 2017-10-01 ENCOUNTER — Ambulatory Visit: Payer: Medicare Other | Admitting: *Deleted

## 2017-10-01 VITALS — Ht 69.0 in | Wt 148.8 lb

## 2017-10-01 DIAGNOSIS — R9389 Abnormal findings on diagnostic imaging of other specified body structures: Secondary | ICD-10-CM

## 2017-10-01 MED ORDER — NA SULFATE-K SULFATE-MG SULF 17.5-3.13-1.6 GM/177ML PO SOLN: 1.0000 | Freq: Once | ORAL | 0 refills | Status: AC

## 2017-10-01 NOTE — Progress Notes (Signed)
No egg or soy allergy known to patient  No issues with past sedation with any surgeries  or procedures, no intubation problems  No diet pills per patient No home 02 use per patient  No blood thinners per patient  Pt denies issues with constipation  - pt states no current issues  No A fib or A flutter  EMMI video sent to pt's e mail - pt declined

## 2017-10-03 ENCOUNTER — Encounter: Payer: Self-pay | Admitting: Podiatry

## 2017-10-03 ENCOUNTER — Ambulatory Visit: Payer: Medicare Other | Admitting: Podiatry

## 2017-10-03 DIAGNOSIS — B351 Tinea unguium: Secondary | ICD-10-CM

## 2017-10-03 DIAGNOSIS — M79606 Pain in leg, unspecified: Secondary | ICD-10-CM

## 2017-10-03 DIAGNOSIS — L600 Ingrowing nail: Secondary | ICD-10-CM | POA: Diagnosis not present

## 2017-10-03 NOTE — Patient Instructions (Signed)
Seen for hypertrophic nails. All nails debrided. Return in 3 months or as needed.  

## 2017-10-03 NOTE — Progress Notes (Signed)
Subjective: 84 y.o. year old male patient presents complaining of painful nails. Patient requests toe nails trimmed.  Patient is able to walk alone and accompanied by his wife. Denies any new problem.  Objective: Dermatologic: Thick yellow deformed nails x 10.  Both big toe nails are thick and ingrown. No acute erythema or open skin noted. Thick yellow debris under nail plate 1-5 bilateral. Vascular: Pedal pulses are not palpable. No edema or erythema noted. Orthopedic: Contracted lesser digits  Neurologic: All epicritic and tactile sensations grossly intact.  Assessment: Dystrophic mycotic nails x 10. Ingrown hallucal nails. Pain in both great toes.  Treatment: All mycotic nails debrided.  Return in 3 months or as needed.

## 2017-10-06 ENCOUNTER — Other Ambulatory Visit: Payer: Self-pay | Admitting: Internal Medicine

## 2017-10-08 ENCOUNTER — Ambulatory Visit (AMBULATORY_SURGERY_CENTER): Payer: Medicare Other | Admitting: Gastroenterology

## 2017-10-08 ENCOUNTER — Encounter: Payer: Self-pay | Admitting: Gastroenterology

## 2017-10-08 VITALS — BP 135/74 | HR 73 | Temp 99.3°F | Resp 16

## 2017-10-08 DIAGNOSIS — D123 Benign neoplasm of transverse colon: Secondary | ICD-10-CM | POA: Diagnosis not present

## 2017-10-08 DIAGNOSIS — R933 Abnormal findings on diagnostic imaging of other parts of digestive tract: Secondary | ICD-10-CM | POA: Diagnosis present

## 2017-10-08 DIAGNOSIS — D122 Benign neoplasm of ascending colon: Secondary | ICD-10-CM

## 2017-10-08 MED ORDER — SODIUM CHLORIDE 0.9 % IV SOLN: 500.0000 mL | Freq: Once | INTRAVENOUS | Status: DC

## 2017-10-08 NOTE — Progress Notes (Signed)
To PACU, VSS. Report to [email or web address] 

## 2017-10-08 NOTE — Progress Notes (Signed)
Called to room to assist during endoscopic procedure.  Patient ID and intended procedure confirmed with present staff. Received instructions for my participation in the procedure from the performing physician.  

## 2017-10-08 NOTE — Op Note (Signed)
[facility] Patient Name: Dylan Orozco Procedure Date: 10/08/2017 2:15 PM MRN: [id number] Endoscopist: Ladene Artist , MD Age: 84 Referring MD:  Date of Birth: 09-21-1933 Gender: Male Account #: [id number] Procedure:                Colonoscopy Indications:              Abnormal CT of the GI tract Medicines:                Monitored Anesthesia Care Procedure:                Pre-Anesthesia Assessment:                           - Prior to the procedure, a History and Physical                            was performed, and patient medications and                            allergies were reviewed. The patient's tolerance of                            previous anesthesia was also reviewed. The risks                            and benefits of the procedure and the sedation                            options and risks were discussed with the patient.                            All questions were answered, and informed consent                            was obtained. Prior Anticoagulants: The patient has                            taken no previous anticoagulant or antiplatelet                            agents. ASA Grade Assessment: II - A patient with                            mild systemic disease. After reviewing the risks                            and benefits, the patient was deemed in                            satisfactory condition to undergo the procedure.                           After obtaining informed consent, the colonoscope  was passed under direct vision. Throughout the                            procedure, the patient's blood pressure, pulse, and                            oxygen saturations were monitored continuously. The                            Colonoscope was introduced through the anus and                            advanced to the the cecum, identified by                            appendiceal orifice and ileocecal valve.  The                            ileocecal valve, appendiceal orifice, and rectum                            were photographed. The quality of the bowel                            preparation was excellent. The colonoscopy was                            performed without difficulty. The patient tolerated                            the procedure well. Scope In: 2:22:16 PM Scope Out: 2:40:06 PM Scope Withdrawal Time: 0 hours 13 minutes 7 seconds  Total Procedure Duration: 0 hours 17 minutes 50 seconds  Findings:                 The perianal and digital rectal examinations were                            normal.                           Five sessile polyps were found in the transverse                            colon (4) and ascending colon (1). The polyps were                            6 to 9 mm in size. These polyps were removed with a                            cold snare. Resection and retrieval were complete.                           Internal hemorrhoids were found during  retroflexion. The hemorrhoids were small and Grade                            I (internal hemorrhoids that do not prolapse).                           The exam was otherwise without abnormality on                            direct and retroflexion views. Complications:            No immediate complications. Estimated blood loss:                            None. Estimated Blood Loss:     Estimated blood loss: none. Impression:               - Five 6 to 9 mm polyps in the transverse colon and                            in the ascending colon, removed with a cold snare.                            Resected and retrieved.                           - Internal hemorrhoids.                           - The examination was otherwise normal on direct                            and retroflexion views. Recommendation:           - Patient has a contact number available for                             emergencies. The signs and symptoms of potential                            delayed complications were discussed with the                            patient. Return to normal activities tomorrow.                            Written discharge instructions were provided to the                            patient.                           - Resume previous diet.                           - Continue present medications.                           -  Await pathology results.                           - No repeat colonoscopy due to age. Ladene Artist, MD 10/08/2017 2:45:18 PM This report has been signed electronically.

## 2017-10-08 NOTE — Progress Notes (Signed)
Pt's states no medical or surgical changes since previsit or office visit. 

## 2017-10-08 NOTE — Patient Instructions (Signed)
Continue present medications. Please read handouts provided.     YOU HAD AN ENDOSCOPIC PROCEDURE TODAY AT THE [facility]:   Refer to the procedure report that was given to you for any specific questions about what was found during the examination.  If the procedure report does not answer your questions, please call your gastroenterologist to clarify.  If you requested that your care partner not be given the details of your procedure findings, then the procedure report has been included in a sealed envelope for you to review at your convenience later.  YOU SHOULD EXPECT: Some feelings of bloating in the abdomen. Passage of more gas than usual.  Walking can help get rid of the air that was put into your GI tract during the procedure and reduce the bloating. If you had a lower endoscopy (such as a colonoscopy or flexible sigmoidoscopy) you may notice spotting of blood in your stool or on the toilet paper. If you underwent a bowel prep for your procedure, you may not have a normal bowel movement for a few days.  Please Note:  You might notice some irritation and congestion in your nose or some drainage.  This is from the oxygen used during your procedure.  There is no need for concern and it should clear up in a day or so.  SYMPTOMS TO REPORT IMMEDIATELY:   Following lower endoscopy (colonoscopy or flexible sigmoidoscopy):  Excessive amounts of blood in the stool  Significant tenderness or worsening of abdominal pains  Swelling of the abdomen that is new, acute  Fever of 100F or higher    For urgent or emergent issues, a gastroenterologist can be reached at any hour by calling [phone].   DIET:  We do recommend a small meal at first, but then you may proceed to your regular diet.  Drink plenty of fluids but you should avoid alcoholic beverages for 24 hours.  ACTIVITY:  You should plan to take it easy for the rest of today and you should NOT DRIVE or use heavy machinery  until tomorrow (because of the sedation medicines used during the test).    FOLLOW UP: Our staff will call the number listed on your records the next business day following your procedure to check on you and address any questions or concerns that you may have regarding the information given to you following your procedure. If we do not reach you, we will leave a message.  However, if you are feeling well and you are not experiencing any problems, there is no need to return our call.  We will assume that you have returned to your regular daily activities without incident.  If any biopsies were taken you will be contacted by phone or by letter within the next 1-3 weeks.  Please call us at [phone] if you have not heard about the biopsies in 3 weeks.    SIGNATURES/CONFIDENTIALITY: You and/or your care partner have signed paperwork which will be entered into your electronic medical record.  These signatures attest to the fact that that the information above on your After Visit Summary has been reviewed and is understood.  Full responsibility of the confidentiality of this discharge information lies with you and/or your care-partner. 

## 2017-10-09 ENCOUNTER — Telehealth: Payer: Self-pay

## 2017-10-09 NOTE — Telephone Encounter (Signed)
  Follow up Call-  Call back number 10/08/2017  Post procedure Call Back phone  # [phone]  Permission to leave phone message Yes  Some recent data might be hidden     Patient questions:  Do you have a fever, pain , or abdominal swelling? No. Pain Score  0 *  Have you tolerated food without any problems? Yes.    Have you been able to return to your normal activities? Yes.    Do you have any questions about your discharge instructions: Diet   No. Medications  No. Follow up visit  No.  Do you have questions or concerns about your Care? No.  Actions: * If pain score is 4 or above: No action needed, pain <4.

## 2017-10-14 ENCOUNTER — Encounter: Payer: Self-pay | Admitting: Internal Medicine

## 2017-10-14 ENCOUNTER — Ambulatory Visit: Payer: Medicare Other | Admitting: Internal Medicine

## 2017-10-14 VITALS — BP 122/72 | HR 63 | Temp 97.9°F | Resp 16 | Ht 69.0 in | Wt 148.4 lb

## 2017-10-14 DIAGNOSIS — R238 Other skin changes: Secondary | ICD-10-CM | POA: Diagnosis not present

## 2017-10-14 DIAGNOSIS — I10 Essential (primary) hypertension: Secondary | ICD-10-CM | POA: Diagnosis not present

## 2017-10-14 DIAGNOSIS — R1900 Intra-abdominal and pelvic swelling, mass and lump, unspecified site: Secondary | ICD-10-CM

## 2017-10-14 LAB — BASIC METABOLIC PANEL
BUN: 19 mg/dL (ref 6–23)
CHLORIDE: 105 meq/L (ref 96–112)
CO2: 29 mEq/L (ref 19–32)
Calcium: 9.2 mg/dL (ref 8.4–10.5)
Creatinine, Ser: 0.98 mg/dL (ref 0.40–1.50)
GFR: 94.05 mL/min (ref >60.00)
GLUCOSE: 121 mg/dL — AB (ref 70–99)
Potassium: 4.1 mEq/L (ref 3.5–5.1)
Sodium: 141 mEq/L (ref 135–145)

## 2017-10-14 MED ORDER — BETAMETHASONE DIPROPIONATE AUG 0.05 % EX CREA: TOPICAL_CREAM | Freq: Two times a day (BID) | CUTANEOUS | 0 refills | Status: DC

## 2017-10-14 NOTE — Patient Instructions (Addendum)
GO TO THE LAB : Get the blood work     GO TO THE FRONT DESK Schedule your next appointment for a  Check up in 6 weeks   Apply the ointment to the areas that itch

## 2017-10-14 NOTE — Assessment & Plan Note (Signed)
Pelvic mass: The patient had a single episode of gross hematuria, CAT scan showed a pelvic /bladder mass. Colonoscopy done  showed polyps and no obvious malignancy. Currently with essentially no symptoms, urology has already contacted the pt, encouraged to call them and make an appointment. Currently with no fever or chills. History of prostate cancer: Last PSA was 0.03 on April 2019 HTN: Check a BMP Rash: He has developed 2-3 more new large blisters (bullae).  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 currently will be very difficult for the patient to see dermatology due to logistics, he does not drive.  Prescribed topical a steroid. RTC 6 months
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
## 2017-10-14 NOTE — Progress Notes (Signed)
Pre visit review using our clinic review tool, if applicable. No additional management support is needed unless otherwise documented below in the visit note. 

## 2017-10-17 LAB — HM DIABETES EYE EXAM

## 2017-10-21 ENCOUNTER — Encounter: Payer: Self-pay | Admitting: Gastroenterology

## 2017-10-23 ENCOUNTER — Ambulatory Visit: Payer: Medicare Other | Admitting: Cardiology

## 2017-10-24 ENCOUNTER — Other Ambulatory Visit: Payer: Self-pay | Admitting: Internal Medicine

## 2017-10-25 ENCOUNTER — Encounter: Payer: Self-pay | Admitting: Cardiology

## 2017-11-01 ENCOUNTER — Ambulatory Visit: Payer: Medicare Other | Admitting: Neurology

## 2017-11-01 ENCOUNTER — Encounter: Payer: Self-pay | Admitting: Neurology

## 2017-11-01 VITALS — BP 120/70 | HR 80 | Ht 69.0 in | Wt 149.2 lb

## 2017-11-01 DIAGNOSIS — R2689 Other abnormalities of gait and mobility: Secondary | ICD-10-CM

## 2017-11-01 DIAGNOSIS — G20 Parkinson's disease: Secondary | ICD-10-CM

## 2017-11-01 MED ORDER — CARBIDOPA-LEVODOPA 25-100 MG PO TABS: ORAL_TABLET | ORAL | 3 refills | Status: DC

## 2017-11-01 NOTE — Progress Notes (Signed)
Follow-up Visit   Date: 11/01/17    Dylan Orozco MRN: [id number] DOB: [birth year]   Interim History: Dylan Orozco is a 84 y.o. right-handed African American male with hypertension, diabetes mellitus type II, glaucoma with left eye blindness, history or prostate cancer, hyperlipidemia returning to the clinic for follow-up of idiopathic parkinson's disease.  The patient was accompanied to the clinic by self.   History of present illness: Starting since June 2015, patient has had 20lb unintentional weight loss which he initially attributed to metformin causing reduced appetite and weight loss. Since stopping this medication, he has gained 2lb. He also complains of frequent choking spells with solids, moreso than liquids. He has noticed that his voice has becomes slower and lower. His movements are slower. Denies problems with sense of smell, vivid dreams or constipation. He feels that he is stooped when walking. His hand writing has become much smaller and not as neat as it previously was. Denies any tremor.  Walking is much slower and he is shuffling his feet.   He also complains of mild memory problems and word finding difficulty. He is managing his own finances. He does not drive.   He underwent modified barium swallow which showed moderate pharyngreal and esophageal dysphagia. He was evaluated by Dr. Fuller Plan for the same complaints who agreed with neurology consultation recommended by his PCP for dysphagia.  He was started on sinemet in 2016 25/100 1 tab TID which improved generalized stiffness and walking.    UPDATE 11/01/2017:  He is here for follow-up visit.  He has not noticed marked benefit with adjusting the times of his sinemet to 9am, noon, and 5pm.  He continues to have severe shuffling gait.  He does not use a cane, as recommended.  Fortunately, he has not suffered falls.  He has stiffness of the arms and legs in the morning.  No new tremors, RLS symptoms,  vivid dreams, or lightheadedness.   Medications:  Current Outpatient Medications on File Prior to Visit  Medication Sig Dispense Refill  . aspirin 81 MG tablet Take 81 mg by mouth daily.    Marland Kitchen atorvastatin (LIPITOR) 20 MG tablet Take 1 tablet (20 mg total) by mouth daily. 30 tablet 6  . augmented betamethasone dipropionate (DIPROLENE-AF) 0.05 % cream Apply topically 2 (two) times daily. 60 g 0  . bisacodyl (DULCOLAX) 5 MG EC tablet Take 5 mg by mouth once. X 4 for a 2 day colon bowel prep    . brimonidine (ALPHAGAN P) 0.1 % SOLN PLACE 1 DROP INTO THE RIGHT EYE 3 TIMES DAILY.    Marland Kitchen dorzolamide (TRUSOPT) 2 % ophthalmic solution PLACE 1 DROP INTO THE RIGHT EYE 2 TIMES DAILY.  0  . escitalopram (LEXAPRO) 5 MG tablet Take 1 tablet (5 mg total) by mouth daily. 90 tablet 0  . hydrOXYzine (ATARAX/VISTARIL) 10 MG tablet Take 1 tablet (10 mg total) by mouth 3 (three) times daily as needed. 30 tablet 0  . latanoprost (XALATAN) 0.005 % ophthalmic solution PLACE 1 DROP INTO THE RIGHT EYE NIGHTLY.    . linagliptin (TRADJENTA) 5 MG TABS tablet Take 1 tablet (5 mg total) by mouth daily. 90 tablet 0  . losartan (COZAAR) 50 MG tablet Take 2 tablets (100 mg total) by mouth daily. 180 tablet 1  . Multiple Vitamin (MULTIVITAMIN WITH MINERALS) TABS Take 1 tablet by mouth daily.    . pilocarpine (PILOCAR) 4 % ophthalmic solution Place 1 drop into the right eye 2  times daily.    . polyethylene glycol powder (MIRALAX) powder Take 1 Container by mouth once. 119 grams for colon 2 day bowel prep 9-17    . timolol (TIMOPTIC) 0.5 % ophthalmic solution PLACE 1 DROP INTO THE RIGHT EYE 2 TIMES DAILY.  0   No current facility-administered medications on file prior to visit.     Allergies:  Allergies  Allergen Reactions  . Glimepiride     REACTION: ? UPSET STOMACH  . Pravastatin     Constipation  . Ramipril      dizziness    Review of Systems:  CONSTITUTIONAL: No fevers, chills, night sweats, or weight loss.  EYES:  No visual changes or eye pain ENT: No hearing changes.  No history of nose bleeds.   RESPIRATORY: No cough, wheezing and shortness of breath.   CARDIOVASCULAR: Negative for chest pain, and palpitations.   GI: Negative for abdominal discomfort, blood in stools or black stools.  No recent change in bowel habits.   GU:  No history of incontinence.   MUSCLOSKELETAL: No history of joint pain or swelling.  No myalgias.   SKIN: Negative for lesions, rash, and itching.   ENDOCRINE: Negative for cold or heat intolerance, polydipsia or goiter.   PSYCH:  No depression +anxiety symptoms.   NEURO: As Above.   Vital Signs:  BP 120/70   Pulse 80   Ht 5\' 9"  (1.753 m)   Wt 149 lb 4 oz (67.7 kg)   SpO2 98%   BMI 22.04 kg/m   General Medical Exam:   General:  Well appearing, comfortable  Eyes/ENT: see cranial nerve examination.   Neck: No masses appreciated.  Full range of motion without tenderness.  No carotid bruits. Respiratory:  Clear to auscultation, good air entry bilaterally.   Cardiac:  Irregular rate and rhythm Ext:  No edema    Neurological Exam: MENTAL STATUS:  Awake, severely blunted affect, smiles appropriately.  Oriented to person, place, and time.  Speech mildly hypophonic and slow (stable), no dysarthria.  CRANIAL NERVES:  Left eye is blind with mild ptosis.  Poor blink.  Pupils equal round and reactive to light.  Normal conjugate, extra-ocular eye movements in all directions of gaze.  Face is symmetric.    MOTOR:  Motor strength is 5/5 in all extremities.  There is mild rigidity of the upper extremities.     COORDINATION/GAIT: Finger and heel tapping shows severely reduced amplitude, normal rate.  Gait shows mildly stooped posture with severe shuffling and somewhat unsteady, gait is tested unassisted  Data: MRI brain wo contrast 08/28/2014:  No acute intracranial findings. Age-related atrophy with mild to moderate small vessel disease.  Labs 08/25/2014:  Vitamin B12 1435, copper  110, zinc 68  IMPRESSION/PLAN: 1.  Akinetic rigid parkinson disease (LUE rigidity, bradykinesia, hypophonia, shuffling gait).  Exam shows slight improvement in rigidity, however  gait remains feninstating. Increase sinemet to 25/100mg  1.5 tablet 9am, noon, and 5pm.   Start physical therapy for gait training Recommend using a cane or walker for support.  He does not drive due to left eye blindness. Encouraged to try to remain active  2.  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  Stable.   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  If I can  answer any additional questions, I would be pleased to do so.    Sincerely,    Jacion Dismore K. Posey Pronto, DO

## 2017-11-01 NOTE — Patient Instructions (Signed)
Increase sinemet 25/100mg  to 1.5 tablet at 9am, noon, and 5pm  Start home physical therapy  Start using a cane.  If balance gets worse, recommend using a walker  Return to clinic in 4 months

## 2017-11-05 ENCOUNTER — Telehealth: Payer: Self-pay | Admitting: Internal Medicine

## 2017-11-05 NOTE — Telephone Encounter (Signed)
Copied from [place] [phone]. Topic: Quick Communication - Home Health Verbal Orders >> Nov 05, 2017  2:47 PM Margot Ables wrote: Caller/Agency: Constance Haw PT w/[facility] Callback Number: [phone], secure VM if not able to answer, please leave detailed msg Requesting OT/PT/Skilled Nursing/Social Work: PT  Frequency: 2x week for 3 weeks, starting next week

## 2017-11-05 NOTE — Telephone Encounter (Signed)
LMOM w/ verbal orders.  

## 2017-11-11 ENCOUNTER — Telehealth: Payer: Self-pay | Admitting: Internal Medicine

## 2017-11-11 NOTE — Telephone Encounter (Signed)
Call from dermatology, Dr. Delman Cheadle office. Patient diagnosed with bullous pemphigoid, they like to stop Tradjenta as that might be a trigger. That is okay

## 2017-11-12 NOTE — Telephone Encounter (Signed)
Advised patient, I agreed to stop Tradjenta. For now recommend to monitor his blood sugars, call if they are consistently more than 180. We will recheck his A1c when he comes back in few months.

## 2017-11-13 NOTE — Telephone Encounter (Signed)
Spoke w/ Pt- informed of recommendations. Pt verbalized understanding. Med list updated.  °

## 2017-11-14 ENCOUNTER — Telehealth: Payer: Self-pay | Admitting: *Deleted

## 2017-11-14 ENCOUNTER — Telehealth: Payer: Self-pay | Admitting: Internal Medicine

## 2017-11-14 NOTE — Telephone Encounter (Signed)
Received Home Health Certification and Plan of Care; forwarded to provider/SLS 10/24

## 2017-11-14 NOTE — Telephone Encounter (Signed)
Results sent electronically.

## 2017-11-14 NOTE — Telephone Encounter (Signed)
Copied from [place] [phone]. Topic: Quick Communication - See Telephone Encounter >> Nov 14, 2017  2:46 PM Blase Mess A wrote: CRM for notification. See Telephone encounter for: 11/14/17. Helene Kelp from [facility] is requesting the results form the 9/23 BMP, 9/2 CBC, & urine culture. Please fax [phone]

## 2017-11-18 ENCOUNTER — Other Ambulatory Visit: Payer: Self-pay | Admitting: Internal Medicine

## 2017-11-18 DIAGNOSIS — G3184 Mild cognitive impairment, so stated: Secondary | ICD-10-CM

## 2017-11-18 DIAGNOSIS — E119 Type 2 diabetes mellitus without complications: Secondary | ICD-10-CM

## 2017-11-18 DIAGNOSIS — Z7984 Long term (current) use of oral hypoglycemic drugs: Secondary | ICD-10-CM

## 2017-11-18 DIAGNOSIS — G20 Parkinson's disease: Secondary | ICD-10-CM

## 2017-11-18 DIAGNOSIS — H547 Unspecified visual loss: Secondary | ICD-10-CM

## 2017-11-18 DIAGNOSIS — I10 Essential (primary) hypertension: Secondary | ICD-10-CM

## 2017-11-18 NOTE — Telephone Encounter (Signed)
Form signed and faxed to [facility] [place] at [phone]. Form sent for scanning.

## 2017-11-26 ENCOUNTER — Telehealth: Payer: Self-pay | Admitting: *Deleted

## 2017-11-26 NOTE — Telephone Encounter (Signed)
Received Lab Report results from [facility]; forwarded to provider/SLS 11/05

## 2017-11-27 LAB — BASIC METABOLIC PANEL
BUN: 16 (ref 4–21)
Creatinine: 1 (ref 0.6–1.3)
Glucose: 171
Potassium: 4.4 (ref 3.4–5.3)
Sodium: 142 (ref 137–147)

## 2017-11-27 LAB — CBC AND DIFFERENTIAL
HCT: 36 — AB (ref 41–53)
Hemoglobin: 11.6 — AB (ref 13.5–17.5)
Neutrophils Absolute: 2068
PLATELETS: 105 — AB (ref 150–399)
WBC: 4

## 2017-11-27 LAB — HEPATIC FUNCTION PANEL
ALK PHOS: 103 (ref 25–125)
ALT: 6 — AB (ref 10–40)
AST: 17 (ref 14–40)
Bilirubin, Total: 0.6

## 2017-11-28 ENCOUNTER — Encounter: Payer: Self-pay | Admitting: Internal Medicine

## 2017-11-28 ENCOUNTER — Ambulatory Visit: Payer: Medicare Other | Admitting: Internal Medicine

## 2017-11-28 VITALS — BP 142/78 | HR 81 | Temp 97.9°F | Resp 16 | Ht 69.0 in | Wt 151.2 lb

## 2017-11-28 DIAGNOSIS — E118 Type 2 diabetes mellitus with unspecified complications: Secondary | ICD-10-CM

## 2017-11-28 LAB — HEMOGLOBIN A1C: Hgb A1c MFr Bld: 7.6 % — ABNORMAL HIGH (ref 4.6–6.5)

## 2017-11-28 MED ORDER — ONETOUCH LANCETS MISC: 12 refills | Status: DC

## 2017-11-28 MED ORDER — GLUCOSE BLOOD VI STRP: ORAL_STRIP | 12 refills | Status: DC

## 2017-11-28 MED ORDER — ONETOUCH VERIO FLEX SYSTEM W/DEVICE KIT: PACK | 0 refills | Status: DC

## 2017-11-28 NOTE — Patient Instructions (Addendum)
GO TO THE LAB : Get the blood work     GO TO THE FRONT DESK Schedule your next appointment for a   Check up in 2 months   Diabetes: Check your blood sugar  once a day  Check your blood sugar  at different times of the day  GOALS: Fasting before a meal 70- 130 2 hours after a meal less than 180 Call if your sugars are more than 200 consistently
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
## 2017-11-28 NOTE — Progress Notes (Signed)
Pre visit review using our clinic review tool, if applicable. No additional management support is needed unless otherwise documented below in the visit note. 

## 2017-11-28 NOTE — Assessment & Plan Note (Signed)
DM: Stop Tradjenta a week ago as recommended by dermatology, will check A1c today and asked to RTC in 2 months, if A1C quite elevated, will try a different medicine. Also rec  to check ambulatory CBGs, call if the become more than 200 consistently. No strict DM control will be recommended due to age and overall medical status. Pelvic mass: Saw urology, cystoscopy normal ~ 10/21/2017, the "mass" was likely a clot at the bladder.  No further work-up needed. Rash: See above, dermatology recommended to stop Tradjenta a week ago, they felt like the large blisters were related to the medicine.  Will review records from Dr. Delman Cheadle office RTC 2 months

## 2017-11-29 ENCOUNTER — Telehealth: Payer: Self-pay | Admitting: *Deleted

## 2017-11-29 NOTE — Telephone Encounter (Signed)
Received Lab Report results from Dermatology Specialist for elevated Glucose; forwarded to provider/SLS 11/08

## 2017-11-29 NOTE — Telephone Encounter (Signed)
Glucose 171 CBC show white count of 4.0, hemoglobin 11.6, platelets 105.  Not far from baseline. Creatinine 1.0, sodium 142, potassium 4.4, calcium 9.6.  LFTs normal.

## 2017-12-02 ENCOUNTER — Encounter: Payer: Self-pay | Admitting: Internal Medicine

## 2017-12-05 ENCOUNTER — Encounter: Payer: Self-pay | Admitting: Cardiology

## 2017-12-05 ENCOUNTER — Ambulatory Visit: Payer: Medicare Other | Admitting: Cardiology

## 2017-12-05 VITALS — BP 116/68 | HR 67 | Ht 69.0 in | Wt 145.0 lb

## 2017-12-05 DIAGNOSIS — E782 Mixed hyperlipidemia: Secondary | ICD-10-CM

## 2017-12-05 DIAGNOSIS — E088 Diabetes mellitus due to underlying condition with unspecified complications: Secondary | ICD-10-CM | POA: Diagnosis not present

## 2017-12-05 DIAGNOSIS — I493 Ventricular premature depolarization: Secondary | ICD-10-CM | POA: Diagnosis not present

## 2017-12-05 DIAGNOSIS — I10 Essential (primary) hypertension: Secondary | ICD-10-CM
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
## 2017-12-05 NOTE — Patient Instructions (Signed)
Medication Instructions:  Your physician recommends that you continue on your current medications as directed. Please refer to the Current Medication list given to you today.  If you need a refill on your cardiac medications before your next appointment, please call your pharmacy.   Lab work: None  If you have labs (blood work) drawn today and your tests are completely normal, you will receive your results only by: Marland Kitchen MyChart Message (if you have MyChart) OR . A paper copy in the mail If you have any lab test that is abnormal or we need to change your treatment, we will call you to review the results.  Testing/Procedures: None  Follow-Up: At [facility], you and your health needs are our priority.  As part of our continuing mission to provide you with exceptional heart care, we have created designated Provider Care Teams.  These Care Teams include your primary Cardiologist (physician) and Advanced Practice Providers (APPs -  Physician Assistants and Nurse Practitioners) who all work together to provide you with the care you need, when you need it.  You will need a follow up appointment in 6 months.  Please call our office 2 months in advance to schedule this appointment.  You may see another member of our [employer] Provider Team in [place]: Jenne Campus, MD . Shirlee More, MD  Any Other Special Instructions Will Be Listed Below (If Applicable).
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
## 2017-12-11 ENCOUNTER — Ambulatory Visit: Payer: Self-pay | Admitting: *Deleted

## 2017-12-11 MED ORDER — PIOGLITAZONE HCL 30 MG PO TABS: 30.0000 mg | ORAL_TABLET | Freq: Every day | ORAL | 1 refills | Status: DC

## 2017-12-11 NOTE — Telephone Encounter (Signed)
Recommend Actos 30 mg 1 p.o. daily #30 and 1 refill. Call with CBG readings in 2 weeks. Watch for side effects such as fluid retention, edema, difficulty breathing

## 2017-12-11 NOTE — Telephone Encounter (Signed)
Spoke w/ Pt- informed of recommendations. Pt verbalized understanding. Actos sent to [facility] pharmacy.

## 2017-12-11 NOTE — Telephone Encounter (Signed)
Pt reports BS Friday 320, Saturday 253. Has not checked since, " Monitor broke, I need a new one."  States had been on Tradjenta, D/Ced a few weeks ago due to "breakout of boils." States he took one Tradjenta yesterday "Since BS running high" and broke out on arms. States "I know I shouldn't have done that."  Has appt with dermatologist tomorrow. States instructed by Dr. Larose Kells to call if blood sugars over 200, "And he would start me on a different medication."  Pt denies frequent urination, thirst, dizziness, weakness, vomiting. States vision blurry at times "But it always is with eye drops I have to use."  Pt questioning if Dr. Larose Kells wants him to start on medication other than Tradjenta. States he will obtain new monitor today. Please advise: [phone]  Reason for Disposition . [1] Blood glucose > 300 mg/dL (16.7 mmol/L) AND [2] two or more times in a row  Answer Assessment - Initial Assessment Questions 1. BLOOD GLUCOSE: "What is your blood glucose level?"      320 Friday...253 Saturday 2. ONSET: "When did you check the blood glucose?"     Saturday, not since 3. USUAL RANGE: "What is your glucose level usually?" (e.g., usual fasting morning value, usual evening value)     unsure 4. KETONES: "Do you check for ketones (urine or blood test strips)?" If yes, ask: "What does the test show now?"      no 5. TYPE 1 or 2:  "Do you know what type of diabetes you have?"  (e.g., Type 1, Type 2, Gestational; doesn't know)      Type 2 6. INSULIN: "Do you take insulin?" "What type of insulin(s) do you use? What is the mode of delivery? (syringe, pen (e.g., injection or  pump)?"      no 7. DIABETES PILLS: "Do you take any pills for your diabetes?" If yes, ask: "Have you missed taking any pills recently?"     Not on any 8. OTHER SYMPTOMS: "Do you have any symptoms?" (e.g., fever, frequent urination, difficulty breathing, dizziness, weakness, vomiting)    no  Protocols used: DIABETES - HIGH BLOOD  SUGAR-A-AH

## 2017-12-11 NOTE — Telephone Encounter (Signed)
Please advise 

## 2017-12-12 ENCOUNTER — Ambulatory Visit: Payer: Self-pay

## 2017-12-12 NOTE — Telephone Encounter (Signed)
FYI

## 2017-12-12 NOTE — Telephone Encounter (Signed)
Telephone  Call from Patient and wife.  Wife state that Patient feel yesterday and today.  Feels it is related to Patient not having his diabetic medication.  Telephone call to Patients' pharmacy [facility].  Pharmacist states that they did receive order and medication should be ready in about an hour.  Related to Patient and wife the information received from pharmacist. Preceded to triage Patient related to fall. Patent states that was ashing up at the bathroom vanity. Had been bending over for a while, when rising back up Patient states  became dizzy and fell , hit his head.  Patient also states that he takes medication that makes him dizzy.  Patient states that he is most concerned with washing up and bending over.  Denies any other symptoms.  Patient states that he is getter better. Denies headache. Last fall was at 1:45 today Patient has not be diagnosed with mTBI.  Has not had a MRIof head.  Has not been evaluated . Appointment has been made for Patient to be evaluated at Dr.  Larose Kells office on 12/13/17 @10 :00am. Patient voiced understanding Did not receive any Rx meds   Reason for Disposition . [1] Concussion symptoms staying SAME (not worse or better) AND [2] present > 3 days  Answer Assessment - Initial Assessment Questions 1. SYMPTOMS: "What symptom are you most concerned about?"     WASHING UP bending over 2. OTHER SYMPTOMS: "Do you have any other symptoms?" (e.g., nausea, difficulty concentrating)     No denies ,  3. ONSET / PATTERN:  "Are you the same, getting better, or getting worse?"  "What's changed?"    Getting better 4. HEADACHE: "Do you have any headache pain?" If so, ask: "How bad is it?"  (Scale 1-10; or mild, moderate, severe)   - MILD - Doesn't interfere with normal activities   - MODERATE - Interferes with normal activities (e.g., work or school) or awakens from sleep   - SEVERE - Excruciating pain, unable to do any normal activities     denies 5. mTBI: "When did  your head injury happen?" "How did it occur?"      1: 47 6. mTBI DIAGNOSIS:  "Who diagnosed your concussion?"     na 7. mTBI TESTING: "Did you have a CT scan or MRI of your head?"     na 8. mTBI LAST VISIT:  "When were you seen for your head injury?"      9. MEDICATIONS:  "Did you receive any prescription meds?"  If so, ask:  "What are they?" " Were any OTC meds recommended?"     no 10. FOLLOW-UP APPT:  "Do you have a follow-up appointment?"       yes  Protocols used: CONCUSSION (MTBI) LESS THAN 14 DAYS AGO FOLLOW-UP CALL-A-AH

## 2017-12-13 ENCOUNTER — Ambulatory Visit: Payer: Medicare Other | Admitting: Internal Medicine

## 2017-12-13 ENCOUNTER — Encounter (HOSPITAL_BASED_OUTPATIENT_CLINIC_OR_DEPARTMENT_OTHER): Payer: Self-pay | Admitting: Emergency Medicine

## 2017-12-13 ENCOUNTER — Emergency Department (HOSPITAL_BASED_OUTPATIENT_CLINIC_OR_DEPARTMENT_OTHER): Payer: Medicare Other

## 2017-12-13 ENCOUNTER — Observation Stay (HOSPITAL_BASED_OUTPATIENT_CLINIC_OR_DEPARTMENT_OTHER)
Admission: EM | Admit: 2017-12-13 | Discharge: 2017-12-17 | Disposition: A | Discharge status: Home / Self Care | Payer: Medicare Other | Attending: Internal Medicine | Admitting: Internal Medicine

## 2017-12-13 ENCOUNTER — Other Ambulatory Visit: Payer: Self-pay

## 2017-12-13 ENCOUNTER — Encounter: Payer: Self-pay | Admitting: Internal Medicine

## 2017-12-13 VITALS — BP 118/64 | HR 159 | Temp 98.2°F | Resp 16 | Ht 69.0 in | Wt 149.4 lb

## 2017-12-13 DIAGNOSIS — I4891 Unspecified atrial fibrillation: Secondary | ICD-10-CM | POA: Diagnosis not present

## 2017-12-13 DIAGNOSIS — D649 Anemia, unspecified: Secondary | ICD-10-CM | POA: Diagnosis present

## 2017-12-13 DIAGNOSIS — Z7984 Long term (current) use of oral hypoglycemic drugs: Secondary | ICD-10-CM | POA: Insufficient documentation

## 2017-12-13 DIAGNOSIS — R55 Syncope and collapse: Secondary | ICD-10-CM

## 2017-12-13 DIAGNOSIS — Z8546 Personal history of malignant neoplasm of prostate: Secondary | ICD-10-CM | POA: Diagnosis not present

## 2017-12-13 DIAGNOSIS — D696 Thrombocytopenia, unspecified: Secondary | ICD-10-CM | POA: Diagnosis not present

## 2017-12-13 DIAGNOSIS — Z87891 Personal history of nicotine dependence: Secondary | ICD-10-CM | POA: Diagnosis not present

## 2017-12-13 DIAGNOSIS — N179 Acute kidney failure, unspecified: Secondary | ICD-10-CM | POA: Diagnosis not present

## 2017-12-13 DIAGNOSIS — N289 Disorder of kidney and ureter, unspecified: Secondary | ICD-10-CM

## 2017-12-13 DIAGNOSIS — Z79899 Other long term (current) drug therapy: Secondary | ICD-10-CM | POA: Diagnosis not present

## 2017-12-13 DIAGNOSIS — Z7982 Long term (current) use of aspirin: Secondary | ICD-10-CM | POA: Diagnosis not present

## 2017-12-13 DIAGNOSIS — G20 Parkinson's disease: Secondary | ICD-10-CM | POA: Insufficient documentation

## 2017-12-13 DIAGNOSIS — E1165 Type 2 diabetes mellitus with hyperglycemia: Secondary | ICD-10-CM | POA: Diagnosis not present

## 2017-12-13 DIAGNOSIS — E0590 Thyrotoxicosis, unspecified without thyrotoxic crisis or storm: Secondary | ICD-10-CM

## 2017-12-13 DIAGNOSIS — R739 Hyperglycemia, unspecified: Secondary | ICD-10-CM

## 2017-12-13 DIAGNOSIS — Z0289 Encounter for other administrative examinations: Secondary | ICD-10-CM

## 2017-12-13 DIAGNOSIS — R000 Tachycardia, unspecified: Secondary | ICD-10-CM | POA: Diagnosis present

## 2017-12-13 DIAGNOSIS — I10 Essential (primary) hypertension: Secondary | ICD-10-CM | POA: Diagnosis present

## 2017-12-13 DIAGNOSIS — E119 Type 2 diabetes mellitus without complications: Secondary | ICD-10-CM

## 2017-12-13 LAB — COMPREHENSIVE METABOLIC PANEL
ALK PHOS: 102 U/L (ref 38–126)
AST: 43 U/L — AB (ref 15–41)
Albumin: 3.8 g/dL (ref 3.5–5.0)
Anion gap: 10 (ref 5–15)
BUN: 53 mg/dL — AB (ref 8–23)
CHLORIDE: 105 mmol/L (ref 98–111)
CO2: 22 mmol/L (ref 22–32)
CREATININE: 1.44 mg/dL — AB (ref 0.61–1.24)
Calcium: 9.3 mg/dL (ref 8.9–10.3)
GFR calc Af Amer: 51 mL/min — ABNORMAL LOW (ref >60)
GFR, EST NON AFRICAN AMERICAN: 44 mL/min — AB (ref >60)
Glucose, Bld: 348 mg/dL — ABNORMAL HIGH (ref 70–99)
Potassium: 5.5 mmol/L — ABNORMAL HIGH (ref 3.5–5.1)
Sodium: 137 mmol/L (ref 135–145)
Total Bilirubin: 1.1 mg/dL (ref 0.3–1.2)
Total Protein: 7.2 g/dL (ref 6.5–8.1)

## 2017-12-13 LAB — CBC WITH DIFFERENTIAL/PLATELET
Abs Immature Granulocytes: 0.03 10*3/uL (ref 0.00–0.07)
Basophils Absolute: 0 10*3/uL (ref 0.0–0.1)
Basophils Relative: 0 %
EOS PCT: 1 %
Eosinophils Absolute: 0 10*3/uL (ref 0.0–0.5)
HEMATOCRIT: 35.7 % — AB (ref 39.0–52.0)
HEMOGLOBIN: 10.7 g/dL — AB (ref 13.0–17.0)
Immature Granulocytes: 1 %
LYMPHS ABS: 0.3 10*3/uL — AB (ref 0.7–4.0)
LYMPHS PCT: 7 %
MCH: 29.1 pg (ref 26.0–34.0)
MCHC: 30 g/dL (ref 30.0–36.0)
MCV: 97 fL (ref 80.0–100.0)
MONO ABS: 0.1 10*3/uL (ref 0.1–1.0)
Monocytes Relative: 2 %
Neutro Abs: 4 10*3/uL (ref 1.7–7.7)
Neutrophils Relative %: 89 %
Platelets: 72 10*3/uL — ABNORMAL LOW (ref 150–400)
RBC: 3.68 MIL/uL — ABNORMAL LOW (ref 4.22–5.81)
RDW: 14.1 % (ref 11.5–15.5)
WBC: 4.5 10*3/uL (ref 4.0–10.5)
nRBC: 0 % (ref 0.0–0.2)

## 2017-12-13 LAB — BASIC METABOLIC PANEL
Anion gap: 5 (ref 5–15)
BUN: 48 mg/dL — AB (ref 8–23)
CHLORIDE: 111 mmol/L (ref 98–111)
CO2: 21 mmol/L — ABNORMAL LOW (ref 22–32)
CREATININE: 1.27 mg/dL — AB (ref 0.61–1.24)
Calcium: 8.3 mg/dL — ABNORMAL LOW (ref 8.9–10.3)
GFR calc Af Amer: 59 mL/min — ABNORMAL LOW (ref >60)
GFR calc non Af Amer: 51 mL/min — ABNORMAL LOW (ref >60)
Glucose, Bld: 322 mg/dL — ABNORMAL HIGH (ref 70–99)
Potassium: 5 mmol/L (ref 3.5–5.1)
SODIUM: 137 mmol/L (ref 135–145)

## 2017-12-13 LAB — TROPONIN I: Troponin I: 0.05 ng/mL (ref <0.03)

## 2017-12-13 LAB — TSH: TSH: 0.04 u[IU]/mL — AB (ref 0.350–4.500)

## 2017-12-13 MED ORDER — TIMOLOL MALEATE 0.5 % OP SOLN
1.0000 [drp] | Freq: Two times a day (BID) | OPHTHALMIC | Status: DC
  Administered 2017-12-14 – 2017-12-17 (×7): 1 [drp] via OPHTHALMIC
  Filled 2017-12-13: qty 5

## 2017-12-13 MED ORDER — ACETAMINOPHEN 325 MG PO TABS: 650.0000 mg | ORAL_TABLET | Freq: Four times a day (QID) | ORAL | Status: DC | PRN

## 2017-12-13 MED ORDER — DILTIAZEM HCL 100 MG IV SOLR
5.0000 mg/h | INTRAVENOUS | Status: DC
  Administered 2017-12-13 (×2): 5 mg/h via INTRAVENOUS

## 2017-12-13 MED ORDER — SODIUM CHLORIDE 0.9 % IV BOLUS
500.0000 mL | Freq: Once | INTRAVENOUS | Status: AC
  Administered 2017-12-13: 17:00:00 via INTRAVENOUS

## 2017-12-13 MED ORDER — DILTIAZEM LOAD VIA INFUSION
5.0000 mg | Freq: Once | INTRAVENOUS | Status: AC
  Administered 2017-12-13: 5 mg via INTRAVENOUS
  Filled 2017-12-13: qty 5

## 2017-12-13 MED ORDER — CARBIDOPA-LEVODOPA 25-100 MG PO TABS
1.5000 | ORAL_TABLET | Freq: Three times a day (TID) | ORAL | Status: DC
  Administered 2017-12-14 – 2017-12-17 (×11): 1.5 via ORAL
  Filled 2017-12-13 (×13): qty 2

## 2017-12-13 MED ORDER — ACETAMINOPHEN 650 MG RE SUPP: 650.0000 mg | Freq: Four times a day (QID) | RECTAL | Status: DC | PRN

## 2017-12-13 MED ORDER — LATANOPROST 0.005 % OP SOLN
1.0000 [drp] | Freq: Every day | OPHTHALMIC | Status: DC
  Administered 2017-12-14 – 2017-12-16 (×4): 1 [drp] via OPHTHALMIC
  Filled 2017-12-13: qty 2.5

## 2017-12-13 MED ORDER — DILTIAZEM HCL 100 MG IV SOLR
INTRAVENOUS | Status: AC
  Filled 2017-12-13: qty 100

## 2017-12-13 MED ORDER — PIOGLITAZONE HCL 30 MG PO TABS
30.0000 mg | ORAL_TABLET | Freq: Every day | ORAL | Status: DC
  Administered 2017-12-14 – 2017-12-17 (×4): 30 mg via ORAL
  Filled 2017-12-13 (×4): qty 1

## 2017-12-13 MED ORDER — SODIUM CHLORIDE 0.9 % IV BOLUS
500.0000 mL | Freq: Once | INTRAVENOUS | Status: AC
  Administered 2017-12-13: 500 mL via INTRAVENOUS

## 2017-12-13 MED ORDER — BRIMONIDINE TARTRATE 0.15 % OP SOLN
1.0000 [drp] | Freq: Three times a day (TID) | OPHTHALMIC | Status: DC
  Filled 2017-12-13: qty 5

## 2017-12-13 MED ORDER — SODIUM CHLORIDE 0.9 % IV SOLN
Freq: Once | INTRAVENOUS | Status: AC
  Administered 2017-12-13: 17:00:00 via INTRAVENOUS

## 2017-12-13 MED ORDER — ATORVASTATIN CALCIUM 10 MG PO TABS
20.0000 mg | ORAL_TABLET | Freq: Every day | ORAL | Status: DC
  Administered 2017-12-14 – 2017-12-17 (×4): 20 mg via ORAL
  Filled 2017-12-13 (×4): qty 2

## 2017-12-13 MED ORDER — AMIODARONE IV BOLUS ONLY 150 MG/100ML
150.0000 mg | Freq: Once | INTRAVENOUS | Status: AC
  Administered 2017-12-13: 150 mg via INTRAVENOUS

## 2017-12-13 MED ORDER — ADULT MULTIVITAMIN W/MINERALS CH
1.0000 | ORAL_TABLET | Freq: Every day | ORAL | Status: DC
  Administered 2017-12-14 – 2017-12-17 (×4): 1 via ORAL
  Filled 2017-12-13 (×4): qty 1

## 2017-12-13 MED ORDER — ONDANSETRON HCL 4 MG PO TABS: 4.0000 mg | ORAL_TABLET | Freq: Four times a day (QID) | ORAL | Status: DC | PRN

## 2017-12-13 MED ORDER — DORZOLAMIDE HCL 2 % OP SOLN
1.0000 [drp] | Freq: Two times a day (BID) | OPHTHALMIC | Status: DC
  Administered 2017-12-14 – 2017-12-17 (×7): 1 [drp] via OPHTHALMIC
  Filled 2017-12-13: qty 10

## 2017-12-13 MED ORDER — ONDANSETRON HCL 4 MG/2ML IJ SOLN: 4.0000 mg | Freq: Four times a day (QID) | INTRAMUSCULAR | Status: DC | PRN

## 2017-12-13 MED ORDER — INSULIN ASPART 100 UNIT/ML ~~LOC~~ SOLN: 0.0000 [IU] | Freq: Three times a day (TID) | SUBCUTANEOUS | Status: DC

## 2017-12-13 MED ORDER — AMIODARONE HCL IN DEXTROSE 360-4.14 MG/200ML-% IV SOLN
60.0000 mg/h | INTRAVENOUS | Status: AC
  Administered 2017-12-13: 60 mg/h via INTRAVENOUS
  Filled 2017-12-13: qty 200

## 2017-12-13 MED ORDER — AMIODARONE HCL IN DEXTROSE 360-4.14 MG/200ML-% IV SOLN
30.0000 mg/h | INTRAVENOUS | Status: DC
  Filled 2017-12-13 (×2): qty 200
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
## 2017-12-13 NOTE — ED Notes (Signed)
ED Provider at bedside. 

## 2017-12-13 NOTE — ED Notes (Signed)
ED Provider at bedside.aware of low b/p . Cardizem drip on hold. NS bolus infusing. Pt awake alert, no pain. Skin warm and dry.
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
## 2017-12-13 NOTE — ED Notes (Signed)
Pt awake alert, provider at bedside. cardizem drip held

## 2017-12-13 NOTE — ED Notes (Signed)
MD aware of new EKG HR lowered, in 90's

## 2017-12-13 NOTE — ED Notes (Signed)
Patient denies pain and is resting comfortably.  

## 2017-12-13 NOTE — Telephone Encounter (Signed)
Noted, thanks!

## 2017-12-13 NOTE — Progress Notes (Signed)
Pre visit review using our clinic review tool, if applicable. No additional management support is needed unless otherwise documented below in the visit note. 

## 2017-12-13 NOTE — ED Triage Notes (Addendum)
Sent from PCP for tachycardia.  Denies shortness of breath, chest pain.  Patient alert and oriented in NAD.  Denies history of the same.

## 2017-12-13 NOTE — Telephone Encounter (Signed)
Author phoned pt. to follow-up on fall and missed appointment with Dr. Larose Kells today. Pt. states he was not aware he had an appointment today, thought today was Thursday. Pt. states he fell 2X yesterday, but no other falls since. Pt. Stated he just started actos, and "I think it's helping". When asked if he could check his sugars while over the phone, he said "I can't", and would not elaborate further despite questioning. Pt. stated he was planning on taking excedrin, even though he denies having a headache at this time. Wife in background. Author re-scheduled appointment with Dr. Larose Kells for 2:40 today, and pt. stated he would be there. Routed to Dr. Larose Kells and Vilma Prader as Juluis Rainier.

## 2017-12-14 ENCOUNTER — Observation Stay (HOSPITAL_BASED_OUTPATIENT_CLINIC_OR_DEPARTMENT_OTHER): Payer: Medicare Other

## 2017-12-14 ENCOUNTER — Observation Stay (HOSPITAL_COMMUNITY): Payer: Medicare Other

## 2017-12-14 DIAGNOSIS — I340 Nonrheumatic mitral (valve) insufficiency: Secondary | ICD-10-CM

## 2017-12-14 DIAGNOSIS — N289 Disorder of kidney and ureter, unspecified: Secondary | ICD-10-CM | POA: Diagnosis not present

## 2017-12-14 DIAGNOSIS — I351 Nonrheumatic aortic (valve) insufficiency: Secondary | ICD-10-CM

## 2017-12-14 DIAGNOSIS — I4891 Unspecified atrial fibrillation: Secondary | ICD-10-CM | POA: Diagnosis not present

## 2017-12-14 DIAGNOSIS — E118 Type 2 diabetes mellitus with unspecified complications: Secondary | ICD-10-CM | POA: Diagnosis not present

## 2017-12-14 DIAGNOSIS — E1165 Type 2 diabetes mellitus with hyperglycemia: Secondary | ICD-10-CM | POA: Diagnosis not present

## 2017-12-14 DIAGNOSIS — R55 Syncope and collapse: Secondary | ICD-10-CM | POA: Diagnosis not present

## 2017-12-14 DIAGNOSIS — E0590 Thyrotoxicosis, unspecified without thyrotoxic crisis or storm: Secondary | ICD-10-CM | POA: Diagnosis not present

## 2017-12-14 DIAGNOSIS — D696 Thrombocytopenia, unspecified: Secondary | ICD-10-CM | POA: Diagnosis not present

## 2017-12-14 LAB — IRON AND TIBC
Iron: 41 ug/dL — ABNORMAL LOW (ref 45–182)
Saturation Ratios: 16 % — ABNORMAL LOW (ref 17.9–39.5)
TIBC: 249 ug/dL — ABNORMAL LOW (ref 250–450)
UIBC: 208 ug/dL

## 2017-12-14 LAB — BASIC METABOLIC PANEL
Anion gap: 6 (ref 5–15)
BUN: 35 mg/dL — ABNORMAL HIGH (ref 8–23)
CO2: 20 mmol/L — ABNORMAL LOW (ref 22–32)
CREATININE: 1.21 mg/dL (ref 0.61–1.24)
Calcium: 8.4 mg/dL — ABNORMAL LOW (ref 8.9–10.3)
Chloride: 112 mmol/L — ABNORMAL HIGH (ref 98–111)
GFR calc Af Amer: >60 mL/min (ref >60)
GFR, EST NON AFRICAN AMERICAN: 54 mL/min — AB (ref >60)
GLUCOSE: 252 mg/dL — AB (ref 70–99)
Potassium: 4.2 mmol/L (ref 3.5–5.1)
SODIUM: 138 mmol/L (ref 135–145)

## 2017-12-14 LAB — CBC
HCT: 36.9 % — ABNORMAL LOW (ref 39.0–52.0)
HCT: 35.6 % — ABNORMAL LOW (ref 39.0–52.0)
HCT: 35.9 % — ABNORMAL LOW (ref 39.0–52.0)
HEMOGLOBIN: 10.7 g/dL — AB (ref 13.0–17.0)
Hemoglobin: 11.4 g/dL — ABNORMAL LOW (ref 13.0–17.0)
Hemoglobin: 11.1 g/dL — ABNORMAL LOW (ref 13.0–17.0)
MCH: 27.9 pg (ref 26.0–34.0)
MCH: 28.8 pg (ref 26.0–34.0)
MCH: 28.8 pg (ref 26.0–34.0)
MCHC: 30.1 g/dL (ref 30.0–36.0)
MCHC: 30.9 g/dL (ref 30.0–36.0)
MCHC: 30.9 g/dL (ref 30.0–36.0)
MCV: 92.7 fL (ref 80.0–100.0)
MCV: 93 fL (ref 80.0–100.0)
MCV: 93.2 fL (ref 80.0–100.0)
PLATELETS: 74 10*3/uL — AB (ref 150–400)
PLATELETS: 76 10*3/uL — AB (ref 150–400)
Platelets: 67 10*3/uL — ABNORMAL LOW (ref 150–400)
RBC: 3.84 MIL/uL — ABNORMAL LOW (ref 4.22–5.81)
RBC: 3.86 MIL/uL — ABNORMAL LOW (ref 4.22–5.81)
RBC: 3.96 MIL/uL — AB (ref 4.22–5.81)
RDW: 13.9 % (ref 11.5–15.5)
RDW: 13.9 % (ref 11.5–15.5)
RDW: 13.9 % (ref 11.5–15.5)
WBC: 5.2 10*3/uL (ref 4.0–10.5)
WBC: 5.1 10*3/uL (ref 4.0–10.5)
WBC: 5 10*3/uL (ref 4.0–10.5)
nRBC: 0 % (ref 0.0–0.2)
nRBC: 0 % (ref 0.0–0.2)
nRBC: 0 % (ref 0.0–0.2)

## 2017-12-14 LAB — ECHOCARDIOGRAM COMPLETE
Height: 69 in
Weight: 2241.6 oz

## 2017-12-14 LAB — MAGNESIUM: Magnesium: 1.9 mg/dL (ref 1.7–2.4)

## 2017-12-14 LAB — RETICULOCYTES
IMMATURE RETIC FRACT: 7.3 % (ref 2.3–15.9)
RBC.: 3.9 MIL/uL — AB (ref 4.22–5.81)
RETIC COUNT ABSOLUTE: 27.3 10*3/uL (ref 19.0–186.0)
Retic Ct Pct: 0.7 % (ref 0.4–3.1)

## 2017-12-14 LAB — TYPE AND SCREEN
ABO/RH(D): O POS
ANTIBODY SCREEN: NEGATIVE

## 2017-12-14 LAB — LACTATE DEHYDROGENASE: LDH: 234 U/L — AB (ref 98–192)

## 2017-12-14 LAB — T4, FREE: FREE T4: 0.96 ng/dL (ref 0.82–1.77)

## 2017-12-14 LAB — FOLATE: FOLATE: 21.9 ng/mL (ref >5.9)

## 2017-12-14 LAB — GLUCOSE, CAPILLARY
GLUCOSE-CAPILLARY: 127 mg/dL — AB (ref 70–99)
GLUCOSE-CAPILLARY: 114 mg/dL — AB (ref 70–99)
Glucose-Capillary: 202 mg/dL — ABNORMAL HIGH (ref 70–99)
Glucose-Capillary: 259 mg/dL — ABNORMAL HIGH (ref 70–99)
Glucose-Capillary: 217 mg/dL — ABNORMAL HIGH (ref 70–99)

## 2017-12-14 LAB — ABO/RH: ABO/RH(D): O POS

## 2017-12-14 LAB — TROPONIN I
Troponin I: 0.03 ng/mL (ref <0.03)
Troponin I: <0.03 ng/mL (ref <0.03)
Troponin I: 0.03 ng/mL (ref <0.03)

## 2017-12-14 LAB — MRSA PCR SCREENING: MRSA by PCR: NEGATIVE

## 2017-12-14 LAB — FERRITIN: Ferritin: 147 ng/mL (ref 24–336)

## 2017-12-14 LAB — VITAMIN B12: VITAMIN B 12: 902 pg/mL (ref 180–914)

## 2017-12-14 MED ORDER — PREDNISONE 20 MG PO TABS
30.0000 mg | ORAL_TABLET | Freq: Every day | ORAL | Status: DC
  Administered 2017-12-14 – 2017-12-17 (×4): 30 mg via ORAL
  Filled 2017-12-14 (×4): qty 1

## 2017-12-14 MED ORDER — APIXABAN 5 MG PO TABS
5.0000 mg | ORAL_TABLET | Freq: Two times a day (BID) | ORAL | Status: DC
  Administered 2017-12-14 – 2017-12-17 (×7): 5 mg via ORAL
  Filled 2017-12-14 (×7): qty 1

## 2017-12-14 MED ORDER — AMIODARONE HCL 200 MG PO TABS
200.0000 mg | ORAL_TABLET | Freq: Two times a day (BID) | ORAL | Status: DC
  Administered 2017-12-14 – 2017-12-17 (×6): 200 mg via ORAL
  Filled 2017-12-14 (×6): qty 1

## 2017-12-14 MED ORDER — PANTOPRAZOLE SODIUM 40 MG PO TBEC
40.0000 mg | DELAYED_RELEASE_TABLET | Freq: Every day | ORAL | Status: DC
  Administered 2017-12-14 – 2017-12-17 (×4): 40 mg via ORAL
  Filled 2017-12-14 (×4): qty 1

## 2017-12-14 MED ORDER — CLOBETASOL PROPIONATE 0.05 % EX CREA
TOPICAL_CREAM | Freq: Two times a day (BID) | CUTANEOUS | Status: DC
  Administered 2017-12-14 – 2017-12-16 (×5): via TOPICAL
  Administered 2017-12-17: 1 via TOPICAL
  Filled 2017-12-14: qty 15

## 2017-12-14 MED ORDER — INSULIN ASPART 100 UNIT/ML ~~LOC~~ SOLN
0.0000 [IU] | Freq: Three times a day (TID) | SUBCUTANEOUS | Status: DC
  Administered 2017-12-14: 3 [IU] via SUBCUTANEOUS
  Administered 2017-12-14: 7 [IU] via SUBCUTANEOUS
  Administered 2017-12-15: 4 [IU] via SUBCUTANEOUS
  Administered 2017-12-15 – 2017-12-16 (×3): 7 [IU] via SUBCUTANEOUS
  Administered 2017-12-16 (×2): 4 [IU] via SUBCUTANEOUS
  Administered 2017-12-17: 7 [IU] via SUBCUTANEOUS
  Administered 2017-12-17 (×2): 4 [IU] via SUBCUTANEOUS
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
## 2017-12-14 NOTE — Progress Notes (Signed)
  Echocardiogram 2D Echocardiogram has been performed.  Johny Chess 12/14/2017, 11:03 AM
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
## 2017-12-14 NOTE — Assessment & Plan Note (Signed)
Syncope: 2 episodes of syncope this week, these were no mechanical falls, patient believes he LOC for a split second and then fall. EKG today rate of 153.  Suspect A. fib or a flutter of new onset, previously he had trigeminy. I discussed the issue with the ER doctor, blood pressure looks good, he is not diaphoretic and in distress, will transport him downstairs escorted by my [place] and in a wheelchair. Most likely will need to be admitted. Neck sprain: Due to recent fall, moving the neck okay.  Might need x-ray. DM: No recent hypoglycemia, see HPI
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
## 2017-12-15 DIAGNOSIS — N179 Acute kidney failure, unspecified: Secondary | ICD-10-CM | POA: Diagnosis not present

## 2017-12-15 DIAGNOSIS — Z794 Long term (current) use of insulin: Secondary | ICD-10-CM

## 2017-12-15 DIAGNOSIS — E118 Type 2 diabetes mellitus with unspecified complications: Secondary | ICD-10-CM | POA: Diagnosis not present

## 2017-12-15 DIAGNOSIS — I4891 Unspecified atrial fibrillation: Secondary | ICD-10-CM | POA: Diagnosis not present

## 2017-12-15 DIAGNOSIS — D696 Thrombocytopenia, unspecified: Secondary | ICD-10-CM | POA: Diagnosis not present

## 2017-12-15 LAB — CBC
HCT: 37.7 % — ABNORMAL LOW (ref 39.0–52.0)
Hemoglobin: 11.7 g/dL — ABNORMAL LOW (ref 13.0–17.0)
MCH: 28.5 pg (ref 26.0–34.0)
MCHC: 31 g/dL (ref 30.0–36.0)
MCV: 91.7 fL (ref 80.0–100.0)
Platelets: 80 10*3/uL — ABNORMAL LOW (ref 150–400)
RBC: 4.11 MIL/uL — ABNORMAL LOW (ref 4.22–5.81)
RDW: 13.8 % (ref 11.5–15.5)
WBC: 5.5 10*3/uL (ref 4.0–10.5)
nRBC: 0 % (ref 0.0–0.2)

## 2017-12-15 LAB — GLUCOSE, CAPILLARY
GLUCOSE-CAPILLARY: 207 mg/dL — AB (ref 70–99)
GLUCOSE-CAPILLARY: 217 mg/dL — AB (ref 70–99)
GLUCOSE-CAPILLARY: 271 mg/dL — AB (ref 70–99)
Glucose-Capillary: 152 mg/dL — ABNORMAL HIGH (ref 70–99)

## 2017-12-15 LAB — BASIC METABOLIC PANEL
Anion gap: 7 (ref 5–15)
BUN: 35 mg/dL — ABNORMAL HIGH (ref 8–23)
CHLORIDE: 110 mmol/L (ref 98–111)
CO2: 21 mmol/L — AB (ref 22–32)
CREATININE: 1.1 mg/dL (ref 0.61–1.24)
Calcium: 8.7 mg/dL — ABNORMAL LOW (ref 8.9–10.3)
GFR calc non Af Amer: >60 mL/min (ref >60)
GLUCOSE: 195 mg/dL — AB (ref 70–99)
Potassium: 4.3 mmol/L (ref 3.5–5.1)
Sodium: 138 mmol/L (ref 135–145)

## 2017-12-15 LAB — URINALYSIS, ROUTINE W REFLEX MICROSCOPIC
BILIRUBIN URINE: NEGATIVE
GLUCOSE, UA: 50 mg/dL — AB
HGB URINE DIPSTICK: NEGATIVE
KETONES UR: 5 mg/dL — AB
Leukocytes, UA: NEGATIVE
Nitrite: NEGATIVE
PROTEIN: NEGATIVE mg/dL
Specific Gravity, Urine: 1.024 (ref 1.005–1.030)
pH: 5 (ref 5.0–8.0)
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
## 2017-12-15 NOTE — Care Management Note (Signed)
Case Management Note  Patient Details  Name: Dylan Orozco MRN: [id number] Date of Birth: March 10, 1933  Subjective/Objective:        Pt presents from home for new onset afib with RVR.   Pt lives with wife, who is able to assist and drives as needed.  Pt uses cane but feels may need a RW.  No other DME needed.  Pt had [facility] for 3 weeks in the last few months but does not remember name of [facility] agency.    Daughter at bedside during conversation.  Daughter appears very upset with patient, but vague about what the problems are.  In the hallway, she divulged that she has her own health problems and is on dialysis.  She expressed frustration saying that her father "will not listen".  She states she "has been dealing with this for years".  Daughter abruptly left room to go outside during conversation.  Pt appears cooperative, but maybe forgetful and not completely understanding.            Action/Plan: Pt given Eliquis 30 day free card.  Daughter took card and secured among her things.  Benefits check sent for Eliquis and advised patient we will notify him of cost when available.  Plan is for patient to work with PT/OT tomorrow morning to assess needs.    Expected Discharge Date:      12/16/17            Expected Discharge Plan:  [place]  In-House Referral:  NA  Discharge planning Services  CM Consult  Post Acute Care Choice:    Choice offered to:     DME Arranged:    DME Agency:     HH Arranged:    HH Agency:     Status of Service:  In process, will continue to follow  If discussed at Long Length of Stay Meetings, dates discussed:    Additional Comments:  Claudie Leach, RN 12/15/2017, 5:57 PM
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
## 2017-12-15 NOTE — Care Management Obs Status (Signed)
[facility] NOTIFICATION   Patient Details  Name: Dylan Orozco MRN: [id number] Date of Birth: [birth year]   Medicare Observation Status Notification Given:  Yes    Claudie Leach, RN 12/15/2017, 5:00 PM

## 2017-12-15 NOTE — Progress Notes (Signed)
Orthostatic Vitals:  Lying: BP 102/64 HR 82 Sitting: BP 111/70 HR 82 Standing: BP 123/65 HR 83 Standing 3 min: BP 112/63 HR 84   Pt stated he became dizzy after standing for 3 minutes.

## 2017-12-16 DIAGNOSIS — I4891 Unspecified atrial fibrillation: Secondary | ICD-10-CM | POA: Diagnosis not present

## 2017-12-16 LAB — CBC
HCT: 40.5 % (ref 39.0–52.0)
Hemoglobin: 12.4 g/dL — ABNORMAL LOW (ref 13.0–17.0)
MCH: 27.9 pg (ref 26.0–34.0)
MCHC: 30.6 g/dL (ref 30.0–36.0)
MCV: 91.2 fL (ref 80.0–100.0)
Platelets: 89 10*3/uL — ABNORMAL LOW (ref 150–400)
RBC: 4.44 MIL/uL (ref 4.22–5.81)
RDW: 13.7 % (ref 11.5–15.5)
WBC: 6.7 10*3/uL (ref 4.0–10.5)
nRBC: 0 % (ref 0.0–0.2)

## 2017-12-16 LAB — BASIC METABOLIC PANEL
Anion gap: 6 (ref 5–15)
BUN: 36 mg/dL — ABNORMAL HIGH (ref 8–23)
CALCIUM: 8.6 mg/dL — AB (ref 8.9–10.3)
CO2: 21 mmol/L — AB (ref 22–32)
CREATININE: 1.37 mg/dL — AB (ref 0.61–1.24)
Chloride: 111 mmol/L (ref 98–111)
GFR, EST AFRICAN AMERICAN: 54 mL/min — AB (ref >60)
GFR, EST NON AFRICAN AMERICAN: 46 mL/min — AB (ref >60)
Glucose, Bld: 231 mg/dL — ABNORMAL HIGH (ref 70–99)
Potassium: 4.5 mmol/L (ref 3.5–5.1)
SODIUM: 138 mmol/L (ref 135–145)

## 2017-12-16 LAB — GLUCOSE, CAPILLARY
GLUCOSE-CAPILLARY: 175 mg/dL — AB (ref 70–99)
GLUCOSE-CAPILLARY: 190 mg/dL — AB (ref 70–99)
Glucose-Capillary: 218 mg/dL — ABNORMAL HIGH (ref 70–99)
Glucose-Capillary: 237 mg/dL — ABNORMAL HIGH (ref 70–99)

## 2017-12-16 LAB — T3, FREE: T3, Free: 1.3 pg/mL — ABNORMAL LOW (ref 2.0–4.4)

## 2017-12-16 LAB — PATHOLOGIST SMEAR REVIEW

## 2017-12-16 MED ORDER — ACETAMINOPHEN 325 MG PO TABS: 650.0000 mg | ORAL_TABLET | Freq: Four times a day (QID) | ORAL | Status: DC | PRN

## 2017-12-16 NOTE — Evaluation (Signed)
Occupational Therapy Evaluation Patient Details Name: Dylan Orozco MRN: [id number] DOB: [birth year] Today's Date: 12/16/2017    History of Present Illness 84 yo male admitted for Afib with RVR PMH blind L eye, cataract, DM, trigeminy, prostate CA, HTN, glaucoma, anxiety    Clinical Impression   PT admitted with Afib with RVR. Pt currently with functional limitiations due to the deficits listed below (see OT problem list). Pt currently requires min (A) for bed mobility and min guard (A) with RW. Pt needs incr time and mod cues.  Pt will benefit from skilled OT to increase their independence and safety with adls and balance to allow discharge hhot.     Follow Up Recommendations  Home health OT    Equipment Recommendations  None recommended by OT    Recommendations for Other Services       Precautions / Restrictions Precautions Precautions: Fall Restrictions Weight Bearing Restrictions: No      Mobility Bed Mobility Overal bed mobility: Needs Assistance Bed Mobility: Supine to Sit;Sit to Supine     Supine to sit: Min assist Sit to supine: Min assist   General bed mobility comments: pt requires bed rail and (A) to elevate trunk from surface. pt requries (A) with bil Le back on to bed surface  Transfers Overall transfer level: Needs assistance Equipment used: Rolling walker (2 wheeled) Transfers: Sit to/from Stand Sit to Stand: Min guard         General transfer comment: pt requires cues for hand placement and incr time    Balance Overall balance assessment: Mild deficits observed, not formally tested                                         ADL either performed or assessed with clinical judgement   ADL Overall ADL's : Needs assistance/impaired Eating/Feeding: Set up   Grooming: Wash/dry face;Oral care;Min guard;Standing Grooming Details (indicate cue type and reason): pt able to maintain standing at sink with incr time and UE support on  surface Upper Body Bathing: Set up   Lower Body Bathing: Minimal assistance Lower Body Bathing Details (indicate cue type and reason): pt able to cross bil LE with figure 4 with knee not completely flexed Upper Body Dressing : Set up   Lower Body Dressing: Minimal assistance Lower Body Dressing Details (indicate cue type and reason): pt able to adjust bil socks. Pt with R LE more difficult than L Toilet Transfer: Minimal assistance;RW           Functional mobility during ADLs: Min guard;Rolling walker General ADL Comments: pt able to sustain task at sink for >5 minutes     Vision Baseline Vision/History: Wears glasses;Legally blind;Glaucoma(sees shadows with L eye only ( legally blind)) Wears Glasses: Reading only       Perception     Praxis      Pertinent Vitals/Pain Pain Assessment: No/denies pain     Hand Dominance Right   Extremity/Trunk Assessment Upper Extremity Assessment Upper Extremity Assessment: Overall WFL for tasks assessed   Lower Extremity Assessment Lower Extremity Assessment: Defer to PT evaluation   Cervical / Trunk Assessment Cervical / Trunk Assessment: Kyphotic   Communication Communication Communication: HOH   Cognition Arousal/Alertness: Awake/alert Behavior During Therapy: WFL for tasks assessed/performed Overall Cognitive Status: Difficult to assess  General Comments: pt asking the same questions but question if this was due to hearing   General Comments  pt noted to have multiple open wounds on skin. pt states "did you see my boils" pt noted to have large wound on R side neck    Exercises     Shoulder Instructions      Home Living Family/patient expects to be discharged to:: Private residence Living Arrangements: Spouse/significant other Available Help at Discharge: Family               Bathroom Shower/Tub: Teacher, early years/pre: Standard     Home Equipment:  Environmental consultant - 2 wheels   Additional Comments: lives with wife but daughter present for eval      Prior Functioning/Environment Level of Independence: Independent                 OT Problem List: Decreased strength;Decreased activity tolerance;Impaired balance (sitting and/or standing);Decreased cognition;Decreased safety awareness;Decreased knowledge of use of DME or AE;Decreased knowledge of precautions      OT Treatment/Interventions: Self-care/ADL training;Therapeutic exercise;Neuromuscular education;Energy conservation;DME and/or AE instruction;Therapeutic activities;Cognitive remediation/compensation;Patient/family education;Balance training    OT Goals(Current goals can be found in the care plan section) Acute Rehab OT Goals Patient Stated Goal: none stated OT Goal Formulation: With patient Time For Goal Achievement: 12/30/17 Potential to Achieve Goals: Good  OT Frequency: Min 2X/week   Barriers to D/C:            Co-evaluation              AM-PAC OT "6 Clicks" Daily Activity     Outcome Measure Help from another person eating meals?: None Help from another person taking care of personal grooming?: A Little Help from another person toileting, which includes using toliet, bedpan, or urinal?: A Little Help from another person bathing (including washing, rinsing, drying)?: A Little Help from another person to put on and taking off regular upper body clothing?: None Help from another person to put on and taking off regular lower body clothing?: A Little 6 Click Score: 20   End of Session Equipment Utilized During Treatment: Gait belt;Rolling walker Nurse Communication: Mobility status;Precautions  Activity Tolerance: Patient tolerated treatment well Patient left: in bed;with call bell/phone within reach;with family/visitor present;with bed alarm set  OT Visit Diagnosis: Unsteadiness on feet (R26.81);Muscle weakness (generalized) (M62.81);History of falling (Z91.81)                 Time: 3244-0102 OT Time Calculation (min): 17 min Charges:  OT General Charges $OT Visit: 1 Visit OT Evaluation $OT Eval Moderate Complexity: 1 Mod   Jeri Modena, OTR/L  Acute Rehabilitation Services Pager: [phone] Office: [phone] .   Jeri Modena 12/16/2017, 3:23 PM

## 2017-12-16 NOTE — Care Management (Signed)
#    1.   S/W STEPHANIE  @ [employer]  RX # [phone]  ELIQUIS   5 MG  BID COVER- YES CO-PAY- $ 38.70 TIER- 2 DRUG PRIOR APPROVAL- NO  PREFERRED PHARMACY : YES   [facility]

## 2017-12-16 NOTE — Progress Notes (Signed)
[facility] TEAM 1 - Stepdown/ICU TEAM  VALERIO PINARD  XQJ:[id number] DOB: 07-Feb-1933 DOA: 12/13/2017 PCP: Colon Branch, MD    Brief Narrative:  84 y.o.malewithhistory of DM2, hypertension, and Parkinson's disease who had recent work-up for hematuria with cystoscopy and also was recently placed on prednisone for a skin rash attributed to [place]. He presented to his PCP after 2 episodes of syncope.   In the ER the patient was found to be in A. fib with RVR and blood work revealed acute renal failure and thrombocytopenia. TSH was 0.04.   Subjective: The patient is resting comfortably in bed. He denies chest pain shortness of breath nausea vomiting or abdom pain.  He does report some dizziness intermittently which is a chronic problem for him.  Assessment & Plan:  A. fib with RVR new onset TSH is low but free T4 normal - chads 2 vasc score is 4 - reverted back to sinus rhythm - Cardiology started Eliquis - stable at this time   Acute renal failure ?secondary to hypotension from the rapid atrial fibrillation - hold ARB - recheck in AM to assure crt does not rise further   Recent Labs  Lab 12/13/17 1607 12/13/17 1821 12/14/17 0705 12/15/17 0320 12/16/17 0402  CREATININE 1.44* 1.27* 1.21 1.10 1.37*    Chronic Thrombocytopenia Platelet count is stable  Normocytic normochromic anemia likely from recent hematuria- hemoglobin slowly improving as expected  Syncope Not orthostatic   Hypertension holding ARB due to renal failure - blood pressure well controlled  DM2 CBGs slightly elevated - adjust treatment and follow  Bullous pemphigoid  Diagnosed by Dermatologist - has only been taking prednisone though he was prescribed mycophenolate as well  History of bladder blood clot status post cystoscopy 3 weeks ago hematuria has completely resolved  Parkinson's disease on Sinemet   DVT prophylaxis: eliquis Code Status: FULL CODE Family Communication: spoke with  daughter at bedside Disposition Plan: plan for discharge home 11/26 if creatinine stable/improved  Consultants:  Cardiology   Antimicrobials:  none  Objective: Blood pressure 117/67, pulse 66, temperature 98.4 F (36.9 C), temperature source Oral, resp. rate 20, height 5\' 9"  (1.753 m), weight 68.4 kg, SpO2 98 %.  Intake/Output Summary (Last 24 hours) at 12/16/2017 1721 Last data filed at 12/16/2017 0809 Gross per 24 hour  Intake 480 ml  Output 250 ml  Net 230 ml   Filed Weights   12/14/17 0449 12/15/17 0546 12/16/17 0409  Weight: 63.5 kg 68.8 kg 68.4 kg    Examination: General: No acute respiratory distress Lungs: Clear to auscultation bilaterally without wheezes or crackles Cardiovascular: Regular rate and rhythm without murmur gallop or rub normal S1 and S2 Abdomen: Nontender, nondistended, soft, bowel sounds positive, no rebound, no ascites, no appreciable mass Extremities: No significant cyanosis, clubbing, or edema bilateral lower extremities  CBC: Recent Labs  Lab 12/13/17 1818  12/14/17 0705 12/15/17 0320 12/16/17 0402  WBC 4.5   < > 5.0 5.5 6.7  NEUTROABS 4.0  --   --   --   --   HGB 10.7*   < > 11.1* 11.7* 12.4*  HCT 35.7*   < > 35.9* 37.7* 40.5  MCV 97.0   < > 93.0 91.7 91.2  PLT 72*   < > 76* 80* 89*   < > = values in this interval not displayed.   Basic Metabolic Panel: Recent Labs  Lab 12/14/17 0053 12/14/17 0705 12/15/17 0320 12/16/17 0402  NA  --  138 138  138  K  --  4.2 4.3 4.5  CL  --  112* 110 111  CO2  --  20* 21* 21*  GLUCOSE  --  252* 195* 231*  BUN  --  35* 35* 36*  CREATININE  --  1.21 1.10 1.37*  CALCIUM  --  8.4* 8.7* 8.6*  MG 1.9  --   --   --    GFR: Estimated Creatinine Clearance: 40.2 mL/min (A) (by C-G formula based on SCr of 1.37 mg/dL (H)).  Liver Function Tests: Recent Labs  Lab 12/13/17 1607  AST 43*  ALT <5  ALKPHOS 102  BILITOT 1.1  PROT 7.2  ALBUMIN 3.8    Cardiac Enzymes: Recent Labs  Lab  12/13/17 1607 12/14/17 0053 12/14/17 0705 12/14/17 1233  TROPONINI 0.05* 0.03* <0.03 0.03*    HbA1C: Hgb A1c MFr Bld  Date/Time Value Ref Range Status  January 10, 202019 10:17 AM 7.6 (H) 4.6 - 6.5 % Final    Comment:    Glycemic Control Guidelines for People with Diabetes:Non Diabetic:  <6%Goal of Therapy: <7%Additional Action Suggested:  >8%   08/20/2017 10:08 AM 7.3 (H) 4.6 - 6.5 % Final    Comment:    Glycemic Control Guidelines for People with Diabetes:Non Diabetic:  <6%Goal of Therapy: <7%Additional Action Suggested:  >8%     CBG: Recent Labs  Lab 12/15/17 1617 12/15/17 2127 12/16/17 0738 12/16/17 1128 12/16/17 1647  GLUCAP 217* 271* 175* 190* 218*    Recent Results (from the past 240 hour(s))  MRSA PCR Screening     Status: None   Collection Time: 12/13/17 11:25 PM  Result Value Ref Range Status   MRSA by PCR NEGATIVE NEGATIVE Final    Comment:        The GeneXpert MRSA Assay (FDA approved for NASAL specimens only), is one component of a comprehensive MRSA colonization surveillance program. It is not intended to diagnose MRSA infection nor to guide or monitor treatment for MRSA infections. Performed at [facility], [place] [address], [place], [place] [address]      Scheduled Meds: . amiodarone  200 mg Oral BID  . apixaban  5 mg Oral BID  . atorvastatin  20 mg Oral Daily  . brimonidine  1 drop Right Eye TID  . carbidopa-levodopa  1.5 tablet Oral TID AC  . clobetasol cream   Topical BID  . dorzolamide  1 drop Both Eyes BID  . insulin aspart  0-20 Units Subcutaneous TID WC  . latanoprost  1 drop Right Eye QHS  . multivitamin with minerals  1 tablet Oral Daily  . pantoprazole  40 mg Oral Q1200  . pioglitazone  30 mg Oral Daily  . predniSONE  30 mg Oral Q breakfast  . timolol  1 drop Right Eye BID    LOS: 0 days   Cherene Altes, MD [facility] Office  [phone] Pager - Text Page per Amion  If 7PM-7AM, please contact night-coverage  per Amion 12/16/2017, 5:21 PM

## 2017-12-16 NOTE — Evaluation (Signed)
Physical Therapy Evaluation Patient Details Name: Dylan Orozco MRN: [id number] DOB: 25-Jul-1933 Today's Date: 12/16/2017   History of Present Illness  84 yo male admitted for Afib with RVR PMH: blind L eye, cataract, DM, trigeminy, prostate CA, HTN, glaucoma, anxiety  Clinical Impression  Pt admitted with above diagnosis. Pt currently with functional limitations due to the deficits listed below (see PT Problem List). Pt was able to ambulate in hallway with min guard assist with occasional cues with RW.  Encouraged to use RW at home and daughter agrees.  Pt should make good progress.  Continue to follow acutely.  Pt will benefit from skilled PT to increase their independence and safety with mobility to allow discharge to the venue listed below.      Follow Up Recommendations Home health PT;Supervision/Assistance - 24 hour    Equipment Recommendations  Rolling walker with 5" wheels    Recommendations for Other Services       Precautions / Restrictions Precautions Precautions: Fall Restrictions Weight Bearing Restrictions: No      Mobility  Bed Mobility Overal bed mobility: Needs Assistance Bed Mobility: Supine to Sit;Sit to Supine     Supine to sit: Min assist Sit to supine: Min assist   General bed mobility comments: pt requires bed rail and (A) to elevate trunk from surface. pt requries (A) with bil Le back on to bed surface  Transfers Overall transfer level: Needs assistance Equipment used: Rolling walker (2 wheeled) Transfers: Sit to/from Stand Sit to Stand: Min guard         General transfer comment: pt requires cues for hand placement and incr time.  Upon standing, pt began to urinate and grabbed urinal quickly.  Had to change pt and clean him as it got on gown.    Ambulation/Gait Ambulation/Gait assistance: Min guard Gait Distance (Feet): 150 Feet Assistive device: Rolling walker (2 wheeled) Gait Pattern/deviations: Step-through pattern;Decreased stride  length   Gait velocity interpretation: 1.31 - 2.62 ft/sec, indicative of limited community ambulator General Gait Details: Once pt began ambulation, pt was able to progress well and ambulated with good cadence with occasional cues to stay inside RW.   Stairs            Wheelchair Mobility    Modified Rankin (Stroke Patients Only)       Balance Overall balance assessment: Needs assistance Sitting-balance support: No upper extremity supported;Feet supported Sitting balance-Leahy Scale: Fair     Standing balance support: No upper extremity supported;During functional activity Standing balance-Leahy Scale: Fair Standing balance comment: can stand statically for up to a minute without UE support.                              Pertinent Vitals/Pain Pain Assessment: No/denies pain    Home Living Family/patient expects to be discharged to:: Private residence Living Arrangements: Spouse/significant other Available Help at Discharge: Family           Home Equipment: Kasandra Knudsen - single point Additional Comments: lives with wife but daughter present for eval    Prior Function Level of Independence: Independent with assistive device(s)         Comments: used cane PTA     Hand Dominance   Dominant Hand: Right    Extremity/Trunk Assessment   Upper Extremity Assessment Upper Extremity Assessment: Defer to OT evaluation    Lower Extremity Assessment Lower Extremity Assessment: Generalized weakness    Cervical / Trunk  Assessment Cervical / Trunk Assessment: Kyphotic  Communication   Communication: HOH  Cognition Arousal/Alertness: Awake/alert Behavior During Therapy: WFL for tasks assessed/performed Overall Cognitive Status: Difficult to assess                                 General Comments: pt asking the same questions but question if this was due to hearing      General Comments General comments (skin integrity, edema, etc.): pt  noted to have multiple open wounds on skin. pt states "did you see my boils" pt noted to have large wound on R side neck    Exercises     Assessment/Plan    PT Assessment Patient needs continued PT services  PT Problem List Decreased activity tolerance;Decreased balance;Decreased mobility;Decreased knowledge of use of DME;Decreased safety awareness;Decreased knowledge of precautions;Decreased skin integrity       PT Treatment Interventions DME instruction;Gait training;Functional mobility training;Therapeutic activities;Therapeutic exercise;Balance training;Patient/family education    PT Goals (Current goals can be found in the Care Plan section)  Acute Rehab PT Goals Patient Stated Goal: none stated PT Goal Formulation: With patient Time For Goal Achievement: 12/30/17 Potential to Achieve Goals: Good    Frequency Min 3X/week   Barriers to discharge        Co-evaluation               AM-PAC PT "6 Clicks" Mobility  Outcome Measure Help needed turning from your back to your side while in a flat bed without using bedrails?: A Little Help needed moving from lying on your back to sitting on the side of a flat bed without using bedrails?: A Little Help needed moving to and from a bed to a chair (including a wheelchair)?: A Little Help needed standing up from a chair using your arms (e.g., wheelchair or bedside chair)?: A Little Help needed to walk in hospital room?: A Little Help needed climbing 3-5 steps with a railing? : A Little 6 Click Score: 18    End of Session Equipment Utilized During Treatment: Gait belt Activity Tolerance: Patient limited by fatigue Patient left: in bed;with call bell/phone within reach;with bed alarm set;with family/visitor present Nurse Communication: Mobility status PT Visit Diagnosis: Unsteadiness on feet (R26.81);Muscle weakness (generalized) (M62.81)    Time: 1610-9604 PT Time Calculation (min) (ACUTE ONLY): 33 min   Charges:   PT  Evaluation $PT Eval Moderate Complexity: 1 Mod PT Treatments $Gait Training: 8-22 mins        [place] Pager:  [phone]  Office:  [place] 12/16/2017, 4:21 PM

## 2017-12-17 DIAGNOSIS — I4891 Unspecified atrial fibrillation: Secondary | ICD-10-CM | POA: Diagnosis not present

## 2017-12-17 LAB — CBC
HEMATOCRIT: 38.6 % — AB (ref 39.0–52.0)
Hemoglobin: 11.9 g/dL — ABNORMAL LOW (ref 13.0–17.0)
MCH: 28 pg (ref 26.0–34.0)
MCHC: 30.8 g/dL (ref 30.0–36.0)
MCV: 90.8 fL (ref 80.0–100.0)
NRBC: 0 % (ref 0.0–0.2)
Platelets: 93 10*3/uL — ABNORMAL LOW (ref 150–400)
RBC: 4.25 MIL/uL (ref 4.22–5.81)
RDW: 13.7 % (ref 11.5–15.5)
WBC: 6.3 10*3/uL (ref 4.0–10.5)

## 2017-12-17 LAB — GLUCOSE, CAPILLARY
GLUCOSE-CAPILLARY: 174 mg/dL — AB (ref 70–99)
GLUCOSE-CAPILLARY: 181 mg/dL — AB (ref 70–99)
Glucose-Capillary: 238 mg/dL — ABNORMAL HIGH (ref 70–99)

## 2017-12-17 LAB — COMPREHENSIVE METABOLIC PANEL
ALT: 20 U/L (ref 0–44)
ANION GAP: 6 (ref 5–15)
AST: 22 U/L (ref 15–41)
Albumin: 2.7 g/dL — ABNORMAL LOW (ref 3.5–5.0)
Alkaline Phosphatase: 77 U/L (ref 38–126)
BUN: 28 mg/dL — ABNORMAL HIGH (ref 8–23)
CHLORIDE: 109 mmol/L (ref 98–111)
CO2: 23 mmol/L (ref 22–32)
Calcium: 8.7 mg/dL — ABNORMAL LOW (ref 8.9–10.3)
Creatinine, Ser: 1.36 mg/dL — ABNORMAL HIGH (ref 0.61–1.24)
GFR calc non Af Amer: 47 mL/min — ABNORMAL LOW (ref >60)
GFR, EST AFRICAN AMERICAN: 54 mL/min — AB (ref >60)
Glucose, Bld: 199 mg/dL — ABNORMAL HIGH (ref 70–99)
POTASSIUM: 4.5 mmol/L (ref 3.5–5.1)
SODIUM: 138 mmol/L (ref 135–145)
Total Bilirubin: 0.6 mg/dL (ref 0.3–1.2)
Total Protein: 5.5 g/dL — ABNORMAL LOW (ref 6.5–8.1)

## 2017-12-17 LAB — PATHOLOGIST SMEAR REVIEW

## 2017-12-17 MED ORDER — ACETAMINOPHEN 325 MG PO TABS: 650.0000 mg | ORAL_TABLET | Freq: Four times a day (QID) | ORAL | Status: DC | PRN

## 2017-12-17 MED ORDER — PANTOPRAZOLE SODIUM 40 MG PO TBEC: 40.0000 mg | DELAYED_RELEASE_TABLET | Freq: Every day | ORAL | 0 refills | Status: DC

## 2017-12-17 MED ORDER — AMIODARONE HCL 200 MG PO TABS: 200.0000 mg | ORAL_TABLET | Freq: Two times a day (BID) | ORAL | 0 refills | Status: DC

## 2017-12-17 MED ORDER — APIXABAN 5 MG PO TABS: 5.0000 mg | ORAL_TABLET | Freq: Two times a day (BID) | ORAL | 0 refills | Status: DC

## 2017-12-17 MED ORDER — PREDNISONE 10 MG PO TABS: 30.0000 mg | ORAL_TABLET | Freq: Every day | ORAL | Status: DC

## 2017-12-17 MED ORDER — POLYETHYLENE GLYCOL 3350 17 G PO PACK
17.0000 g | PACK | Freq: Every day | ORAL | Status: DC
  Administered 2017-12-17: 17 g via ORAL
  Filled 2017-12-17: qty 1

## 2017-12-17 NOTE — Discharge Summary (Signed)
DISCHARGE SUMMARY  Dylan Orozco  MR#: [id number]  DOB:[birth year]  Date of Admission: 12/13/2017 Date of Discharge: 12/17/2017  Attending Physician:Marcia Lepera Hennie Duos, MD  Patient's PCP:Paz, Alda Berthold, MD  Consults:  [facility] Cardiology   Disposition: D/C home   Follow-up Appts: Follow-up [place], MD Follow up in 1 week(s).   Specialty:  Internal Medicine Contact information: [phone] [facility] [place] [address] [phone]        [name], MD Follow up in 1 week(s).   Specialty:  Cardiology Contact information: [address] [place] [address] [phone]           Tests Needing Follow-up: -f/u crt in outpt setting -assess CBG control  -assess HR control -assess tolerance of Eliquis    Discharge Diagnoses: A. fib with RVR new onset Acute renal failure Chronic Thrombocytopenia Normocytic normochromic anemia Syncope Hypertension DM2 Bullous pemphigoid  History of bladder blood clot status post cystoscopy  Parkinson's disease  Initial presentation: 84 y.o.malewithhistory of DM2, hypertension, and Parkinson's disease who had recent work-up for hematuria with cystoscopy and also was recently placed on prednisone for a skin rash attributed to [name]. He presented to his PCP after 2 episodes of syncope.   In the ER the patient was found to be in A. fib with RVR and blood work revealed acute renal failure and thrombocytopenia. TSH was 0.04.   Hospital Course:  A. fib with RVR new onset TSH is low but free T4 normal - chads 2 vasc score is 4 - reverted back to sinus rhythm during admission- Cardiology started Eliquis - stable at time of d/c in NSR - for outpt Cards f/u    Acute renal failure ?secondary to hypotension from the rapid atrial fibrillation - holding ARB at time of d/c - crt stable at time of d/c   Chronic Thrombocytopenia Platelet count is stable at time of d/c     Normocytic normochromic anemia likely from recent hematuria- hemoglobin slowly improving as expected at time of d/c   Syncope Not orthostatic - has been an ongoing issue - stable on feet at time of d/c   Hypertension holding ARB due to renal failure - blood pressure controlled at time of d/c   DM2 Resume usual home regimen at time of d/c   Bullous pemphigoid  Diagnosed by Dermatologist - has only been taking prednisone though he was prescribed mycophenolate as well - clinically much improved during this hospital stay - cont steroids as prescribed and f/u w/ Dermatology as has already been arranged   History of bladder blood clot status post cystoscopy 3 weeks ago hematuria has completely resolved  Parkinson's disease cont Sinemet  Allergies as of 12/17/2017      Reactions   Glimepiride    REACTION: ? UPSET STOMACH   Tradjenta [linagliptin]    rash   Pravastatin    Constipation   Ramipril     dizziness      Medication List    STOP taking these medications   aspirin 81 MG tablet   losartan 50 MG tablet Commonly known as:  COZAAR   mycophenolate 500 MG tablet Commonly known as:  CELLCEPT     TAKE these medications   acetaminophen 325 MG tablet Commonly known as:  TYLENOL Take 2 tablets (650 mg total) by mouth every 6 (six) hours as needed for mild pain, fever or headache.   ALPHAGAN P 0.1 % Soln Generic  drug:  brimonidine Place 1 drop into the right eye 3 (three) times daily.   amiodarone 200 MG tablet Commonly known as:  PACERONE Take 1 tablet (200 mg total) by mouth 2 (two) times daily.   apixaban 5 MG Tabs tablet Commonly known as:  ELIQUIS Take 1 tablet (5 mg total) by mouth 2 (two) times daily.   atorvastatin 20 MG tablet Commonly known as:  LIPITOR Take 1 tablet (20 mg total) by mouth daily.   carbidopa-levodopa 25-100 MG tablet Commonly known as:  SINEMET IR Take 1.5 tablets at 7am, noon, 5pm What changed:    how much to  take  how to take this  when to take this  additional instructions   cholecalciferol 25 MCG (1000 UT) tablet Commonly known as:  VITAMIN D3 Take 1,000 Units by mouth daily.   clobetasol cream 0.05 % Commonly known as:  TEMOVATE Apply 1 application topically 2 (two) times daily.   dorzolamide 2 % ophthalmic solution Commonly known as:  TRUSOPT Place 1 drop into both eyes 2 (two) times daily.   glucose blood test strip Check blood sugar once daily   latanoprost 0.005 % ophthalmic solution Commonly known as:  XALATAN Place 1 drop into the right eye at bedtime.   multivitamin with minerals Tabs tablet Take 1 tablet by mouth daily.   ONE TOUCH LANCETS Misc Check blood sugar once daily   ONETOUCH VERIO FLEX SYSTEM w/Device Kit Check blood sugar once daily   pantoprazole 40 MG tablet Commonly known as:  PROTONIX Take 1 tablet (40 mg total) by mouth daily at 12 noon. Start taking on:  12/18/2017   pioglitazone 30 MG tablet Commonly known as:  ACTOS Take 1 tablet (30 mg total) by mouth daily.   predniSONE 10 MG tablet Commonly known as:  DELTASONE Take 3 tablets (30 mg total) by mouth daily with breakfast. What changed:    how much to take  when to take this   timolol 0.5 % ophthalmic solution Commonly known as:  TIMOPTIC Place 1 drop into the right eye 2 (two) times daily.            Durable Medical Equipment  (From admission, onward)         Start     Ordered   12/16/17 1731  For home use only DME Walker rolling  Once    Comments:  5" wheels  Question:  Patient needs a walker to treat with the following condition  Answer:  Severe muscle deconditioning   12/16/17 1731          Day of Discharge BP 112/72 (BP Location: Left Arm)   Pulse (!) 59   Temp 97.6 F (36.4 C) (Oral)   Resp 16   Ht '5\' 9"'  (1.753 m)   Wt 68.1 kg   SpO2 98%   BMI 22.17 kg/m   Physical Exam: General: No acute respiratory distress Lungs: Clear to auscultation  bilaterally without wheezes or crackles Cardiovascular: Regular rate and rhythm without murmur gallop or rub normal S1 and S2 Abdomen: Nontender, nondistended, soft, bowel sounds positive, no rebound, no ascites, no appreciable mass Extremities: No significant cyanosis, clubbing, or edema bilateral lower extremities  Basic Metabolic Panel: Recent Labs  Lab 12/13/17 1821 12/14/17 0053 12/14/17 0705 12/15/17 0320 12/16/17 0402 12/17/17 0455  NA 137  --  138 138 138 138  K 5.0  --  4.2 4.3 4.5 4.5  CL 111  --  112* 110 111 109  CO2 21*  --  20* 21* 21* 23  GLUCOSE 322*  --  252* 195* 231* 199*  BUN 48*  --  35* 35* 36* 28*  CREATININE 1.27*  --  1.21 1.10 1.37* 1.36*  CALCIUM 8.3*  --  8.4* 8.7* 8.6* 8.7*  MG  --  1.9  --   --   --   --     Liver Function Tests: Recent Labs  Lab 12/13/17 1607 12/17/17 0455  AST 43* 22  ALT <5 20  ALKPHOS 102 77  BILITOT 1.1 0.6  PROT 7.2 5.5*  ALBUMIN 3.8 2.7*    CBC: Recent Labs  Lab 12/13/17 1818  12/14/17 0425 12/14/17 0705 12/15/17 0320 12/16/17 0402 12/17/17 0455  WBC 4.5   < > 5.1 5.0 5.5 6.7 6.3  NEUTROABS 4.0  --   --   --   --   --   --   HGB 10.7*   < > 10.7* 11.1* 11.7* 12.4* 11.9*  HCT 35.7*   < > 35.6* 35.9* 37.7* 40.5 38.6*  MCV 97.0   < > 92.7 93.0 91.7 91.2 90.8  PLT 72*   < > 74* 76* 80* 89* 93*   < > = values in this interval not displayed.    Cardiac Enzymes: Recent Labs  Lab 12/13/17 1607 12/14/17 0053 12/14/17 0705 12/14/17 1233  TROPONINI 0.05* 0.03* <0.03 0.03*    CBG: Recent Labs  Lab 12/16/17 1128 12/16/17 1647 12/16/17 2129 12/17/17 0812 12/17/17 1114  GLUCAP 190* 218* 237* 174* 238*    Recent Results (from the past 240 hour(s))  MRSA PCR Screening     Status: None   Collection Time: 12/13/17 11:25 PM  Result Value Ref Range Status   MRSA by PCR NEGATIVE NEGATIVE Final    Comment:        The GeneXpert MRSA Assay (FDA approved for NASAL specimens only), is one component of  a comprehensive MRSA colonization surveillance program. It is not intended to diagnose MRSA infection nor to guide or monitor treatment for MRSA infections. Performed at [facility], [place] [address], [place], [place] [address]      Time spent in discharge (includes decision making & examination of pt): 35 minutes  12/17/2017, 3:09 PM   Cherene Altes, MD [facility] Office  [phone] Pager [phone]  On-Call/Text Page:      [email or web address]      password [facility]

## 2017-12-17 NOTE — Progress Notes (Addendum)
Inpatient Diabetes Program Recommendations  AACE/ADA: New Consensus Statement on Inpatient Glycemic Control (2015)  Target Ranges:  Prepandial:   less than 140 mg/dL      Peak postprandial:   less than 180 mg/dL (1-2 hours)      Critically ill patients:  140 - 180 mg/dL   Lab Results  Component Value Date   GLUCAP 238 (H) 12/17/2017   HGBA1C 7.6 (H) 09/27/202019    Review of Glycemic Control Results for NIKAI, QUEST (MRN [id number]) as of 12/17/2017 11:42  Ref. Range 12/16/2017 21:29 12/17/2017 08:12 12/17/2017 11:14  Glucose-Capillary Latest Ref Range: 70 - 99 mg/dL 237 (H) 174 (H) 238 (H)   Diabetes history: Type 2 DM Outpatient Diabetes medications: Actos 30 mg QD Current orders for Inpatient glycemic control: Actos 30 mg QD, Novolog 0-20 units TID, Prednisone 30 mg QAM  Inpatient Diabetes Program Recommendations:   If to remain inpatient and if post prandials continue to exceed 180 mg/dL, consider adding Novolog 3 units TID (assuming that patient is consuming >50% of meal).  Thanks, Bronson Curb, MSN, RNC-OB Diabetes Coordinator [phone] (8a-5p)

## 2017-12-17 NOTE — Care Management Note (Signed)
Case Management Note  Patient Details  Name: Dylan MITTAG MRN: [id number] Date of Birth: 27-Aug-1933  Subjective/Objective: Pt presented for Atrial Fib- plan for transition home today. PTA from home with wife. Plan to return home with DME RW from [facility]- will deliver to the room. CM did offer choice for [facility]. Patient has used [facility] in the past and wants to utilize again.                    Action/Plan: CM did call Cassie with [facility] to make aware of referral. SOC to begin within 24-48 hours post transition home. No further needs from CM at this time.   Expected Discharge Date:  12/17/17               Expected Discharge Plan:  [place]  In-House Referral:  NA  Discharge planning Services  CM Consult  Post Acute Care Choice:  Home Health Choice offered to:  Patient, Spouse  DME Arranged:  Walker rolling DME Agency:  [place] Arranged:  PT, OT [facility] Agency:  [facility]  Status of Service:  Completed, signed off  If discussed at [place] of Stay Meetings, dates discussed:    Additional Comments:  Bethena Roys, RN 12/17/2017, 4:13 PM

## 2017-12-17 NOTE — Progress Notes (Signed)
Physical Therapy Treatment Patient Details Name: Dylan Orozco MRN: [id number] DOB: 01/19/1934 Today's Date: 12/17/2017    History of Present Illness 84 yo male admitted for Afib with RVR PMH: blind L eye, cataract, DM, trigeminy, prostate CA, HTN, glaucoma, anxiety    PT Comments    Patient received in bed, with daughter present, very pleasant and willing to work with therapy this morning. Able to complete bed mobility, transfers with RW, and gait approximately 343f with RW and cues for obstacle navigation in hallway with min guard today, no fatigue or DOE noted and able to perform walkie talkie test without difficulty. He is progressing well and was left sitting at EOB with family and RN present and attending, all other needs met this morning.     Follow Up Recommendations  Home health PT;Supervision/Assistance - 24 hour     Equipment Recommendations  Rolling walker with 5" wheels    Recommendations for Other Services       Precautions / Restrictions Precautions Precautions: Fall Restrictions Weight Bearing Restrictions: No    Mobility  Bed Mobility Overal bed mobility: Needs Assistance Bed Mobility: Supine to Sit     Supine to sit: Min guard;HOB elevated     General bed mobility comments: min guard and extended time today with HOB elevated   Transfers Overall transfer level: Needs assistance Equipment used: Rolling walker (2 wheeled) Transfers: Sit to/from Stand Sit to Stand: Min guard         General transfer comment: cues for safety and sequencing, min guard with extended time with RW   Ambulation/Gait Ambulation/Gait assistance: Min guard Gait Distance (Feet): 300 Feet Assistive device: Rolling walker (2 wheeled) Gait Pattern/deviations: Step-through pattern;Decreased stride length     General Gait Details: ambulated well today but did require cues for obstacle navigation in hallway    Stairs             Wheelchair Mobility     Modified Rankin (Stroke Patients Only)       Balance Overall balance assessment: Needs assistance Sitting-balance support: No upper extremity supported;Feet supported Sitting balance-Leahy Scale: Good     Standing balance support: No upper extremity supported;During functional activity Standing balance-Leahy Scale: Fair Standing balance comment: can stand statically for up to a minute without UE support, able to take sidesteps with very light HHA                             Cognition Arousal/Alertness: Awake/alert Behavior During Therapy: WFL for tasks assessed/performed Overall Cognitive Status: Within Functional Limits for tasks assessed                                        Exercises      General Comments        Pertinent Vitals/Pain Pain Assessment: No/denies pain    Home Living                      Prior Function            PT Goals (current goals can now be found in the care plan section) Acute Rehab PT Goals Patient Stated Goal: none stated PT Goal Formulation: With patient Time For Goal Achievement: 12/30/17 Potential to Achieve Goals: Good Progress towards PT goals: Progressing toward goals    Frequency    Min  3X/week      PT Plan      Co-evaluation              AM-PAC PT "6 Clicks" Mobility   Outcome Measure  Help needed turning from your back to your side while in a flat bed without using bedrails?: None Help needed moving from lying on your back to sitting on the side of a flat bed without using bedrails?: A Little Help needed moving to and from a bed to a chair (including a wheelchair)?: A Little Help needed standing up from a chair using your arms (e.g., wheelchair or bedside chair)?: A Little Help needed to walk in hospital room?: A Little Help needed climbing 3-5 steps with a railing? : A Little 6 Click Score: 19    End of Session   Activity Tolerance: Patient tolerated treatment  well Patient left: in bed;with call bell/phone within reach;with family/visitor present(sitting at EOB with RN present and attending ) Nurse Communication: Mobility status PT Visit Diagnosis: Unsteadiness on feet (R26.81);Muscle weakness (generalized) (M62.81)     Time: 2241-1464 PT Time Calculation (min) (ACUTE ONLY): 16 min  Charges:  $Gait Training: 8-22 mins                     Deniece Ree PT, DPT, CBIS  Supplemental Physical Therapist [place]    Pager [phone] Acute Rehab Office [phone]

## 2017-12-17 NOTE — Discharge Instructions (Signed)
Information on my medicine - ELIQUIS (apixaban)  This medication education was reviewed with me or my healthcare representative as part of my discharge preparation.    Why was Eliquis prescribed for you? Eliquis was prescribed for you to reduce the risk of a blood clot forming that can cause a stroke if you have a medical condition called atrial fibrillation (a type of irregular heartbeat).  What do You need to know about Eliquis ? Take your Eliquis TWICE DAILY - one tablet in the morning and one tablet in the evening with or without food. If you have difficulty swallowing the tablet whole please discuss with your pharmacist how to take the medication safely.  Take Eliquis exactly as prescribed by your doctor and DO NOT stop taking Eliquis without talking to the doctor who prescribed the medication.  Stopping may increase your risk of developing a stroke.  Refill your prescription before you run out.  After discharge, you should have regular check-up appointments with your healthcare provider that is prescribing your Eliquis.  In the future your dose may need to be changed if your kidney function or weight changes by a significant amount or as you get older.  What do you do if you miss a dose? If you miss a dose, take it as soon as you remember on the same day and resume taking twice daily.  Do not take more than one dose of ELIQUIS at the same time to make up a missed dose.  Important Safety Information A possible side effect of Eliquis is bleeding. You should call your healthcare provider right away if you experience any of the following: ? Bleeding from an injury or your nose that does not stop. ? Unusual colored urine (red or dark brown) or unusual colored stools (red or black). ? Unusual bruising for unknown reasons. ? A serious fall or if you hit your head (even if there is no bleeding).  Some medicines may interact with Eliquis and might increase your risk of bleeding or  clotting while on Eliquis. To help avoid this, consult your healthcare provider or pharmacist prior to using any new prescription or non-prescription medications, including herbals, vitamins, non-steroidal anti-inflammatory drugs (NSAIDs) and supplements.  This website has more information on Eliquis (apixaban): http://www.eliquis.com/eliquis/home   Atrial Fibrillation Atrial fibrillation is a type of irregular or rapid heartbeat (arrhythmia). In atrial fibrillation, the heart quivers continuously in a chaotic pattern. This occurs when parts of the heart receive disorganized signals that make the heart unable to pump blood normally. This can increase the risk for stroke, heart failure, and other heart-related conditions. There are different types of atrial fibrillation, including:  Paroxysmal atrial fibrillation. This type starts suddenly, and it usually stops on its own shortly after it starts.  Persistent atrial fibrillation. This type often lasts longer than a week. It may stop on its own or with treatment.  Long-lasting persistent atrial fibrillation. This type lasts longer than 12 months.  Permanent atrial fibrillation. This type does not go away.  Talk with your health care provider to learn about the type of atrial fibrillation that you have. What are the causes? This condition is caused by some heart-related conditions or procedures, including:  A heart attack.  Coronary artery disease.  Heart failure.  Heart valve conditions.  High blood pressure.  Inflammation of the sac that surrounds the heart (pericarditis).  Heart surgery.  Certain heart rhythm disorders, such as Wolf-Parkinson-White syndrome.  Other causes include:  Pneumonia.  Obstructive sleep  apnea.  Blockage of an artery in the lungs (pulmonary embolism, or PE).  Lung cancer.  Chronic lung disease.  Thyroid problems, especially if the thyroid is overactive  (hyperthyroidism).  Caffeine.  Excessive alcohol use or illegal drug use.  Use of some medicines, including certain decongestants and diet pills.  Sometimes, the cause cannot be found. What increases the risk? This condition is more likely to develop in:  People who are older in age.  People who smoke.  People who have diabetes mellitus.  People who are overweight (obese).  Athletes who exercise vigorously.  What are the signs or symptoms? Symptoms of this condition include:  A feeling that your heart is beating rapidly or irregularly.  A feeling of discomfort or pain in your chest.  Shortness of breath.  Sudden light-headedness or weakness.  Getting tired easily during exercise.  In some cases, there are no symptoms. How is this diagnosed? Your health care provider may be able to detect atrial fibrillation when taking your pulse. If detected, this condition may be diagnosed with:  An electrocardiogram (ECG).  A Holter monitor test that records your heartbeat patterns over a 24-hour period.  Transthoracic echocardiogram (TTE) to evaluate how blood flows through your heart.  Transesophageal echocardiogram (TEE) to view more detailed images of your heart.  A stress test.  Imaging tests, such as a CT scan or chest X-ray.  Blood tests.  How is this treated? The main goals of treatment are to prevent blood clots from forming and to keep your heart beating at a normal rate and rhythm. The type of treatment that you receive depends on many factors, such as your underlying medical conditions and how you feel when you are experiencing atrial fibrillation. This condition may be treated with:  Medicine to slow down the heart rate, bring the hearts rhythm back to normal, or prevent clots from forming.  Electrical cardioversion. This is a procedure that resets your hearts rhythm by delivering a controlled, low-energy shock to the heart through your skin.  Different  types of ablation, such as catheter ablation, catheter ablation with pacemaker, or surgical ablation. These procedures destroy the heart tissues that send abnormal signals. When the pacemaker is used, it is placed under your skin to help your heart beat in a regular rhythm.  Follow these instructions at home:  Take over-the counter and prescription medicines only as told by your health care provider.  If your health care provider prescribed a blood-thinning medicine (anticoagulant), take it exactly as told. Taking too much blood-thinning medicine can cause bleeding. If you do not take enough blood-thinning medicine, you will not have the protection that you need against stroke and other problems.  Do not use tobacco products, including cigarettes, chewing tobacco, and e-cigarettes. If you need help quitting, ask your health care provider.  If you have obstructive sleep apnea, manage your condition as told by your health care provider.  Do not drink alcohol.  Do not drink beverages that contain caffeine, such as coffee, soda, and tea.  Maintain a healthy weight. Do not use diet pills unless your health care provider approves. Diet pills may make heart problems worse.  Follow diet instructions as told by your health care provider.  Exercise regularly as told by your health care provider.  Keep all follow-up visits as told by your health care provider. This is important. How is this prevented?  Avoid drinking beverages that contain caffeine or alcohol.  Avoid certain medicines, especially medicines that are  used for breathing problems.  Avoid certain herbs and herbal medicines, such as those that contain ephedra or ginseng.  Do not use illegal drugs, such as cocaine and amphetamines.  Do not smoke.  Manage your high blood pressure. Contact a health care provider if:  You notice a change in the rate, rhythm, or strength of your heartbeat.  You are taking an anticoagulant and you  notice increased bruising.  You tire more easily when you exercise or exert yourself. Get help right away if:  You have chest pain, abdominal pain, sweating, or weakness.  You feel nauseous.  You notice blood in your vomit, bowel movement, or urine.  You have shortness of breath.  You suddenly have swollen feet and ankles.  You feel dizzy.  You have sudden weakness or numbness of the face, arm, or leg, especially on one side of the body.  You have trouble speaking, trouble understanding, or both (aphasia).  Your face or your eyelid droops on one side. These symptoms may represent a serious problem that is an emergency. Do not wait to see if the symptoms will go away. Get medical help right away. Call your local emergency services (911 in the [place] Do not drive yourself to the hospital. This information is not intended to replace advice given to you by your health care provider. Make sure you discuss any questions you have with your health care provider. Document Released: 01/08/2005 Document Revised: 05/18/2015 Document Reviewed: 05/05/2014 Elsevier Interactive Patient Education  [employer].

## 2017-12-18 ENCOUNTER — Telehealth: Payer: Self-pay

## 2017-12-18 NOTE — Telephone Encounter (Signed)
12/18/17  Transition Care Management Follow-up Telephone Call  ADMISSION DATE: 12/13/17 DISCHARGE DATE: 12/17/17   How have you been since you were released from the hospital? Feeling better per patient.  Do you understand why you were in the hospital? Yes   Do you understand the discharge instrcutions? Yes per patient    Items Reviewed: Medications reviewed: Yes   Allergies reviewed: Yes   Dietary changes reviewed: Regular diet per patient.   Referrals reviewed: Appointment scheduled for follow up.   Functional Questionnaire:   Activities of Daily Living (ADLs): Patient can perform all independently.  Any patient concerns? Not at this time   Confirmed importance and date/time of follow-up visits scheduled: Yes    Confirmed with patient if condition begins to worsen call PCP or go to the ER. Yes     Patient was given the office number and encouragred to call back with questions or concerns. Yes

## 2017-12-20 ENCOUNTER — Telehealth: Payer: Self-pay | Admitting: Internal Medicine

## 2017-12-20 NOTE — Telephone Encounter (Signed)
Copied from [place] [id number]. Topic: Quick Communication - Home Health Verbal Orders >> Dec 20, 2017 10:11 AM Rutherford Nail, NT wrote: Caller/AgencyConstance Haw, Physical Therapist with [facility] Number: [phone] Requesting OT/PT/Skilled Nursing/Social Work: Nursing Frequency: States thst she is doing to PT evaluation and would like to add nursing evaluation for diabetes and heart management.

## 2017-12-23 ENCOUNTER — Telehealth: Payer: Self-pay | Admitting: Internal Medicine

## 2017-12-23 ENCOUNTER — Other Ambulatory Visit: Payer: Self-pay | Admitting: Internal Medicine

## 2017-12-23 NOTE — Telephone Encounter (Signed)
LMOM: Last A1c 7.2, doing okay without Tradjenta. He has mild cognitive impairment but is very functional. Was recently admitted to the hospital with atrial fibrillation ; I am planning to follow-up on that admission.

## 2017-12-23 NOTE — Telephone Encounter (Signed)
Dermatologist calling again. Author made Dr. Larose Kells aware and Dr. Larose Kells in process of reaching out to number provided.

## 2017-12-23 NOTE — Telephone Encounter (Signed)
LMOM for [place] w/ verbal orders for PT, nursing.

## 2017-12-23 NOTE — Telephone Encounter (Signed)
Spoke w/ Will, verbal orders given.

## 2017-12-23 NOTE — Telephone Encounter (Signed)
Copied from [place] [id number]. Topic: Quick Communication - See Telephone Encounter >> Dec 23, 2017  1:12 PM Blase Mess A wrote: CRM for notification. See Telephone encounter for: 12/23/17.  Carlynn Herald, PA is calling from [facility] regarding the patient that was seen 12/23/17.  Was diagnosis with  bullous pemphigoid and wanted to know which diabetes medication was prescribed to replaced the tradjenta? Also wanted some insight on the patient's dementia. Please advise Amy Percell Miller direct call back number [phone] X [phone]

## 2017-12-23 NOTE — Telephone Encounter (Signed)
Copied from [place] [phone]. Topic: Quick Communication - See Telephone Encounter >> Dec 23, 2017  4:34 PM Ivar Drape wrote: CRM for notification. See Telephone encounter for: 12/23/17. Will Schalla OT w/[facility] [phone] need verbal orders for 1w1, 2w2

## 2017-12-24 ENCOUNTER — Telehealth: Payer: Self-pay | Admitting: Internal Medicine

## 2017-12-24 NOTE — Telephone Encounter (Signed)
Copied from [place] [phone]. Topic: Quick Communication - See Telephone Encounter >> Dec 24, 2017  4:41 PM Bea Graff, NT wrote: CRM for notification. See Telephone encounter for: 12/24/17. Carlynn Herald with [facility] calling to see if Dr. Larose Kells can relay to the pt that when the pt takes his blood sugar at home and the finger stick reading is high that the pt is to not worry about the number if it reads high due to pt cannot take the Tradjenta because this med is causing bullous pemphigoid that they have dx pt with. Also, the pt had told their office that he take the Tradjenta prn but this cannot be done due to the blistering disease will not improve. CB#: [phone] ext: [phone]

## 2017-12-24 NOTE — Telephone Encounter (Signed)
He is not taking Tradjenta to my knowledge. Please call the patient and be sure he knows it.

## 2017-12-25 NOTE — Telephone Encounter (Signed)
LMOM informing that Pt knows not to take Tradjenta- he has been placed on another medication.

## 2017-12-26 ENCOUNTER — Ambulatory Visit: Payer: Medicare Other | Admitting: Internal Medicine

## 2017-12-26 ENCOUNTER — Encounter: Payer: Self-pay | Admitting: Internal Medicine

## 2017-12-26 ENCOUNTER — Telehealth: Payer: Self-pay | Admitting: Internal Medicine

## 2017-12-26 VITALS — BP 118/74 | HR 71 | Temp 98.2°F | Resp 16 | Ht 69.0 in

## 2017-12-26 DIAGNOSIS — E119 Type 2 diabetes mellitus without complications: Secondary | ICD-10-CM | POA: Diagnosis not present

## 2017-12-26 DIAGNOSIS — E118 Type 2 diabetes mellitus with unspecified complications: Secondary | ICD-10-CM

## 2017-12-26 DIAGNOSIS — Z794 Long term (current) use of insulin: Secondary | ICD-10-CM | POA: Diagnosis not present

## 2017-12-26 DIAGNOSIS — I4891 Unspecified atrial fibrillation: Secondary | ICD-10-CM | POA: Diagnosis not present

## 2017-12-26 DIAGNOSIS — R42 Dizziness and giddiness: Secondary | ICD-10-CM

## 2017-12-26 DIAGNOSIS — R7989 Other specified abnormal findings of blood chemistry: Secondary | ICD-10-CM

## 2017-12-26 LAB — CBC WITH DIFFERENTIAL/PLATELET
BASOS ABS: 0 10*3/uL (ref 0.0–0.1)
BASOS PCT: 0.2 % (ref 0.0–3.0)
Eosinophils Absolute: 0.8 10*3/uL — ABNORMAL HIGH (ref 0.0–0.7)
Eosinophils Relative: 10 % — ABNORMAL HIGH (ref 0.0–5.0)
HEMATOCRIT: 38.3 % — AB (ref 39.0–52.0)
HEMOGLOBIN: 12.3 g/dL — AB (ref 13.0–17.0)
Lymphocytes Relative: 4.8 % — ABNORMAL LOW (ref 12.0–46.0)
Lymphs Abs: 0.4 10*3/uL — ABNORMAL LOW (ref 0.7–4.0)
MCHC: 32.2 g/dL (ref 30.0–36.0)
MCV: 91.7 fl (ref 78.0–100.0)
MONOS PCT: 5.8 % (ref 3.0–12.0)
Monocytes Absolute: 0.5 10*3/uL (ref 0.1–1.0)
NEUTROS PCT: 79.2 % — AB (ref 43.0–77.0)
Neutro Abs: 6.3 10*3/uL (ref 1.4–7.7)
PLATELETS: 111 10*3/uL — AB (ref 150.0–400.0)
RBC: 4.18 Mil/uL — ABNORMAL LOW (ref 4.22–5.81)
RDW: 14.6 % (ref 11.5–15.5)
WBC: 7.9 10*3/uL (ref 4.0–10.5)

## 2017-12-26 LAB — COMPREHENSIVE METABOLIC PANEL
ALT: 12 U/L (ref 0–53)
AST: 17 U/L (ref 0–37)
Albumin: 3.7 g/dL (ref 3.5–5.2)
Alkaline Phosphatase: 133 U/L — ABNORMAL HIGH (ref 39–117)
BUN: 28 mg/dL — ABNORMAL HIGH (ref 6–23)
CALCIUM: 8.9 mg/dL (ref 8.4–10.5)
CHLORIDE: 103 meq/L (ref 96–112)
CO2: 28 meq/L (ref 19–32)
Creatinine, Ser: 1.33 mg/dL (ref 0.40–1.50)
GFR: 66.08 mL/min (ref >60.00)
Glucose, Bld: 410 mg/dL — ABNORMAL HIGH (ref 70–99)
Potassium: 4.2 mEq/L (ref 3.5–5.1)
Sodium: 140 mEq/L (ref 135–145)
Total Bilirubin: 0.5 mg/dL (ref 0.2–1.2)
Total Protein: 6.2 g/dL (ref 6.0–8.3)

## 2017-12-26 LAB — T3, FREE: T3 FREE: 2.4 pg/mL (ref 2.3–4.2)

## 2017-12-26 LAB — GLUCOSE, POCT (MANUAL RESULT ENTRY): POC Glucose: 367 mg/dl — AB (ref 70–99)

## 2017-12-26 LAB — TSH: TSH: 0.47 u[IU]/mL (ref 0.35–4.50)

## 2017-12-26 LAB — T4, FREE: FREE T4: 1.21 ng/dL (ref 0.60–1.60)

## 2017-12-26 MED ORDER — ACARBOSE 25 MG PO TABS: 25.0000 mg | ORAL_TABLET | Freq: Three times a day (TID) | ORAL | 6 refills | Status: DC

## 2017-12-26 MED ORDER — BLOOD GLUCOSE METER KIT: PACK | 0 refills | Status: DC

## 2017-12-26 NOTE — Telephone Encounter (Signed)
Copied from [place] [phone]. Topic: Quick Communication - Home Health Verbal Orders >> Dec 26, 2017  2:25 PM Margot Ables wrote: Caller/Agency: Ashlee w/[facility] Callback Number: [phone], secure VM if not available Requesting OT/PT/Skilled Nursing/Social Work: SN Frequency: work with pt on blood sugar control 1 week 2, 2 week 1, 1 week 1  Pt is needing new accucheck monitor. His is broken. Please provide either a sample machine or send order into pts pharmacy.  [id number] [address], [place]        [phone] (Phone)     [phone] (Fax)

## 2017-12-26 NOTE — Progress Notes (Signed)
Pre visit review using our clinic review tool, if applicable. No additional management support is needed unless otherwise documented below in the visit note. 
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
## 2017-12-26 NOTE — Patient Instructions (Signed)
GO TO THE LAB : Get the blood work     GO TO THE FRONT DESK Schedule your next appointment for a checkup in 2 weeks  Continue the same medications Add medication called a carbose, take 1 with each meal to help your blood sugar.  Diabetes: Check your blood sugar  once a day   at different times of the day  GOALS: Fasting before a meal 70- 130 2 hours after a meal less than 180 At bedtime 90-150   Use a walker  If your dizziness is not improving in the next few days if it gets worse: Let me know

## 2017-12-26 NOTE — Telephone Encounter (Signed)
Spoke w/ Mariane Duval, verbal orders given. Will send glucometer + supplies.

## 2017-12-27 ENCOUNTER — Telehealth: Payer: Self-pay | Admitting: Internal Medicine

## 2017-12-27 MED ORDER — GLIMEPIRIDE 2 MG PO TABS: 2.0000 mg | ORAL_TABLET | Freq: Every day | ORAL | 3 refills | Status: DC

## 2017-12-27 NOTE — Assessment & Plan Note (Signed)
New onset atrial fibrillation: See last visit, had 2 syncopal spells, found to be A. fib with RVR. Was admitted to the hospital, seen by cardiology,Aspirin stopped.  He started Eliquis. Losartan stopped due to increased creatinine. He was a started on amiodarone. EKG today: Sinus rhythm. D/w cards Dr Julianne Rice, no worrisome findings  Check CMP, CBC, TFTs. Low TSH: Checking labs DM: Currently on Actos only, Tradjenta discontinue as it was felt to be a trigger for pemphigus.  A1c earlier this month 7.6. CBG today 367. Although he does not need tight control, 300 is too high.  Will add acarbose 25 mg 3 times daily with meals only.  Asked to check CBGs at home.. Dizziness: Chronic, worse today.  BP - heart rate okay, no stroke sxs, he did not get more dizzy when I walked him today. Probably multifactorial, no change for now, close follow-up, come back in 2 weeks.  Fall prevention discussed.  Does have a walker. RTC 2 weeks

## 2017-12-27 NOTE — Telephone Encounter (Signed)
I spoke with the patient today, his labs are okay except for a blood sugar that is 410. He feels well, today dizziness is back to baseline. Has not pick up  Acarbose prescribed to him yesterday, needs a new glucometer to check his blood sugars. I am concerned about a high blood sugar, we agreed to try again glimepiride ; It is listed in his "allergies", he did not have a rash it simply "upset" his stomach. I will deleted it from his allergy list and send a prescription.  Kaylyn,  please send a prescription for a new glucometer needs to check his sugars 3 times a day.

## 2017-12-27 NOTE — Telephone Encounter (Signed)
I sent one yesterday.

## 2017-12-30 DIAGNOSIS — G20 Parkinson's disease: Secondary | ICD-10-CM

## 2017-12-30 DIAGNOSIS — M6281 Muscle weakness (generalized): Secondary | ICD-10-CM

## 2017-12-30 DIAGNOSIS — I4891 Unspecified atrial fibrillation: Secondary | ICD-10-CM

## 2017-12-30 DIAGNOSIS — R2689 Other abnormalities of gait and mobility: Secondary | ICD-10-CM

## 2017-12-30 DIAGNOSIS — Z7952 Long term (current) use of systemic steroids: Secondary | ICD-10-CM

## 2017-12-30 DIAGNOSIS — D696 Thrombocytopenia, unspecified: Secondary | ICD-10-CM

## 2017-12-30 DIAGNOSIS — Z7984 Long term (current) use of oral hypoglycemic drugs: Secondary | ICD-10-CM

## 2017-12-30 DIAGNOSIS — I10 Essential (primary) hypertension: Secondary | ICD-10-CM

## 2017-12-30 DIAGNOSIS — E119 Type 2 diabetes mellitus without complications: Secondary | ICD-10-CM

## 2017-12-30 DIAGNOSIS — Z794 Long term (current) use of insulin: Secondary | ICD-10-CM

## 2017-12-30 DIAGNOSIS — L120 Bullous pemphigoid: Secondary | ICD-10-CM

## 2017-12-31 ENCOUNTER — Telehealth: Payer: Self-pay

## 2017-12-31 NOTE — Telephone Encounter (Signed)
Plan of care received and faxed to [facility] at [phone]. Form sent for scanning.

## 2018-01-01 ENCOUNTER — Other Ambulatory Visit: Payer: Self-pay

## 2018-01-01 ENCOUNTER — Emergency Department (HOSPITAL_COMMUNITY): Payer: Medicare Other

## 2018-01-01 ENCOUNTER — Observation Stay (HOSPITAL_COMMUNITY): Payer: Medicare Other

## 2018-01-01 ENCOUNTER — Encounter (HOSPITAL_COMMUNITY): Payer: Self-pay | Admitting: General Practice

## 2018-01-01 ENCOUNTER — Observation Stay (HOSPITAL_COMMUNITY)
Admission: EM | Admit: 2018-01-01 | Discharge: 2018-01-02 | Disposition: A | Discharge status: Home / Self Care | Payer: Medicare Other | Attending: Family Medicine | Admitting: Family Medicine

## 2018-01-01 DIAGNOSIS — E785 Hyperlipidemia, unspecified: Secondary | ICD-10-CM | POA: Diagnosis not present

## 2018-01-01 DIAGNOSIS — I4891 Unspecified atrial fibrillation: Secondary | ICD-10-CM | POA: Diagnosis not present

## 2018-01-01 DIAGNOSIS — R471 Dysarthria and anarthria: Secondary | ICD-10-CM | POA: Diagnosis present

## 2018-01-01 DIAGNOSIS — E782 Mixed hyperlipidemia: Secondary | ICD-10-CM | POA: Diagnosis present

## 2018-01-01 DIAGNOSIS — L120 Bullous pemphigoid: Secondary | ICD-10-CM | POA: Insufficient documentation

## 2018-01-01 DIAGNOSIS — R404 Transient alteration of awareness: Secondary | ICD-10-CM | POA: Diagnosis not present

## 2018-01-01 DIAGNOSIS — H401190 Primary open-angle glaucoma, unspecified eye, stage unspecified: Secondary | ICD-10-CM | POA: Insufficient documentation

## 2018-01-01 DIAGNOSIS — E1169 Type 2 diabetes mellitus with other specified complication: Secondary | ICD-10-CM | POA: Insufficient documentation

## 2018-01-01 DIAGNOSIS — R4689 Other symptoms and signs involving appearance and behavior: Secondary | ICD-10-CM

## 2018-01-01 DIAGNOSIS — R4182 Altered mental status, unspecified: Principal | ICD-10-CM | POA: Insufficient documentation

## 2018-01-01 DIAGNOSIS — G20 Parkinson's disease: Secondary | ICD-10-CM | POA: Diagnosis not present

## 2018-01-01 DIAGNOSIS — Z87891 Personal history of nicotine dependence: Secondary | ICD-10-CM | POA: Insufficient documentation

## 2018-01-01 DIAGNOSIS — I10 Essential (primary) hypertension: Secondary | ICD-10-CM | POA: Insufficient documentation

## 2018-01-01 DIAGNOSIS — D696 Thrombocytopenia, unspecified: Secondary | ICD-10-CM | POA: Diagnosis not present

## 2018-01-01 DIAGNOSIS — E119 Type 2 diabetes mellitus without complications: Secondary | ICD-10-CM

## 2018-01-01 DIAGNOSIS — Z79899 Other long term (current) drug therapy: Secondary | ICD-10-CM | POA: Diagnosis not present

## 2018-01-01 DIAGNOSIS — G20C Parkinsonism, unspecified: Secondary | ICD-10-CM | POA: Diagnosis present

## 2018-01-01 HISTORY — DX: Pneumonia, unspecified organism: J18.9

## 2018-01-01 HISTORY — DX: Type 2 diabetes mellitus without complications: E11.9

## 2018-01-01 LAB — CBC WITH DIFFERENTIAL/PLATELET
ABS IMMATURE GRANULOCYTES: 0.05 10*3/uL (ref 0.00–0.07)
BASOS ABS: 0 10*3/uL (ref 0.0–0.1)
BASOS PCT: 0 %
EOS PCT: 21 %
Eosinophils Absolute: 1.2 10*3/uL — ABNORMAL HIGH (ref 0.0–0.5)
HEMATOCRIT: 40.5 % (ref 39.0–52.0)
HEMOGLOBIN: 12.2 g/dL — AB (ref 13.0–17.0)
IMMATURE GRANULOCYTES: 1 %
LYMPHS ABS: 0.3 10*3/uL — AB (ref 0.7–4.0)
LYMPHS PCT: 5 %
MCH: 28.4 pg (ref 26.0–34.0)
MCHC: 30.1 g/dL (ref 30.0–36.0)
MCV: 94.4 fL (ref 80.0–100.0)
MONO ABS: 0.3 10*3/uL (ref 0.1–1.0)
Monocytes Relative: 5 %
NEUTROS ABS: 3.9 10*3/uL (ref 1.7–7.7)
NEUTROS PCT: 68 %
Platelets: 74 10*3/uL — ABNORMAL LOW (ref 150–400)
RBC: 4.29 MIL/uL (ref 4.22–5.81)
RDW: 13.9 % (ref 11.5–15.5)
WBC: 5.7 10*3/uL (ref 4.0–10.5)
nRBC: 0 % (ref 0.0–0.2)

## 2018-01-01 LAB — COMPREHENSIVE METABOLIC PANEL
ALBUMIN: 3.2 g/dL — AB (ref 3.5–5.0)
ALT: 7 U/L (ref 0–44)
ANION GAP: 9 (ref 5–15)
AST: 24 U/L (ref 15–41)
Alkaline Phosphatase: 115 U/L (ref 38–126)
BUN: 28 mg/dL — ABNORMAL HIGH (ref 8–23)
CO2: 25 mmol/L (ref 22–32)
Calcium: 8.6 mg/dL — ABNORMAL LOW (ref 8.9–10.3)
Chloride: 107 mmol/L (ref 98–111)
Creatinine, Ser: 1.42 mg/dL — ABNORMAL HIGH (ref 0.61–1.24)
GFR calc Af Amer: 53 mL/min — ABNORMAL LOW (ref >60)
GFR calc non Af Amer: 46 mL/min — ABNORMAL LOW (ref >60)
GLUCOSE: 364 mg/dL — AB (ref 70–99)
Potassium: 4 mmol/L (ref 3.5–5.1)
SODIUM: 141 mmol/L (ref 135–145)
TOTAL PROTEIN: 6.1 g/dL — AB (ref 6.5–8.1)
Total Bilirubin: 0.6 mg/dL (ref 0.3–1.2)

## 2018-01-01 LAB — URINALYSIS, ROUTINE W REFLEX MICROSCOPIC
Bacteria, UA: NONE SEEN
Bilirubin Urine: NEGATIVE
Glucose, UA: >=500 mg/dL — AB
Hgb urine dipstick: NEGATIVE
KETONES UR: 5 mg/dL — AB
Leukocytes, UA: NEGATIVE
Nitrite: NEGATIVE
PH: 6 (ref 5.0–8.0)
PROTEIN: NEGATIVE mg/dL
Specific Gravity, Urine: 1.022 (ref 1.005–1.030)

## 2018-01-01 LAB — RAPID URINE DRUG SCREEN, HOSP PERFORMED
AMPHETAMINES: NOT DETECTED
Barbiturates: NOT DETECTED
Benzodiazepines: NOT DETECTED
Cocaine: NOT DETECTED
OPIATES: NOT DETECTED
TETRAHYDROCANNABINOL: NOT DETECTED

## 2018-01-01 LAB — TROPONIN I: Troponin I: <0.03 ng/mL (ref <0.03)

## 2018-01-01 LAB — ETHANOL: Alcohol, Ethyl (B): <10 mg/dL (ref <10)

## 2018-01-01 LAB — CBG MONITORING, ED
Glucose-Capillary: 339 mg/dL — ABNORMAL HIGH (ref 70–99)
Glucose-Capillary: 372 mg/dL — ABNORMAL HIGH (ref 70–99)

## 2018-01-01 MED ORDER — ADULT MULTIVITAMIN W/MINERALS CH
1.0000 | ORAL_TABLET | Freq: Every day | ORAL | Status: DC
  Administered 2018-01-02: 1 via ORAL
  Filled 2018-01-01: qty 1

## 2018-01-01 MED ORDER — TIMOLOL MALEATE 0.5 % OP SOLN
1.0000 [drp] | Freq: Two times a day (BID) | OPHTHALMIC | Status: DC
  Administered 2018-01-01 – 2018-01-02 (×2): 1 [drp] via OPHTHALMIC
  Filled 2018-01-01: qty 5

## 2018-01-01 MED ORDER — LATANOPROST 0.005 % OP SOLN
1.0000 [drp] | Freq: Every day | OPHTHALMIC | Status: DC
  Administered 2018-01-01: 1 [drp] via OPHTHALMIC
  Filled 2018-01-01: qty 2.5

## 2018-01-01 MED ORDER — BISACODYL 10 MG RE SUPP: 10.0000 mg | Freq: Every day | RECTAL | Status: DC | PRN

## 2018-01-01 MED ORDER — ONDANSETRON HCL 4 MG PO TABS: 4.0000 mg | ORAL_TABLET | Freq: Four times a day (QID) | ORAL | Status: DC | PRN

## 2018-01-01 MED ORDER — AMIODARONE HCL 200 MG PO TABS
200.0000 mg | ORAL_TABLET | Freq: Two times a day (BID) | ORAL | Status: DC
  Administered 2018-01-01 – 2018-01-02 (×2): 200 mg via ORAL
  Filled 2018-01-01 (×2): qty 1

## 2018-01-01 MED ORDER — CLOBETASOL PROPIONATE 0.05 % EX CREA
1.0000 "application " | TOPICAL_CREAM | Freq: Two times a day (BID) | CUTANEOUS | Status: DC
  Administered 2018-01-01 – 2018-01-02 (×2): 1 via TOPICAL
  Filled 2018-01-01: qty 15

## 2018-01-01 MED ORDER — APIXABAN 5 MG PO TABS
5.0000 mg | ORAL_TABLET | Freq: Two times a day (BID) | ORAL | Status: DC
  Administered 2018-01-01 – 2018-01-02 (×2): 5 mg via ORAL
  Filled 2018-01-01 (×2): qty 1

## 2018-01-01 MED ORDER — DORZOLAMIDE HCL 2 % OP SOLN
1.0000 [drp] | Freq: Two times a day (BID) | OPHTHALMIC | Status: DC
  Administered 2018-01-01 – 2018-01-02 (×2): 1 [drp] via OPHTHALMIC
  Filled 2018-01-01: qty 10

## 2018-01-01 MED ORDER — BOOST HIGH PROTEIN PO LIQD: 1.0000 | ORAL | Status: DC

## 2018-01-01 MED ORDER — DOCUSATE SODIUM 100 MG PO CAPS: 100.0000 mg | ORAL_CAPSULE | Freq: Two times a day (BID) | ORAL | Status: DC | PRN

## 2018-01-01 MED ORDER — PANTOPRAZOLE SODIUM 40 MG PO TBEC
40.0000 mg | DELAYED_RELEASE_TABLET | Freq: Every day | ORAL | Status: DC
  Administered 2018-01-02: 40 mg via ORAL
  Filled 2018-01-01: qty 1

## 2018-01-01 MED ORDER — ONDANSETRON HCL 4 MG/2ML IJ SOLN: 4.0000 mg | Freq: Four times a day (QID) | INTRAMUSCULAR | Status: DC | PRN

## 2018-01-01 MED ORDER — ENOXAPARIN SODIUM 40 MG/0.4ML ~~LOC~~ SOLN: 40.0000 mg | SUBCUTANEOUS | Status: DC

## 2018-01-01 MED ORDER — CARBIDOPA-LEVODOPA 25-100 MG PO TABS
1.5000 | ORAL_TABLET | ORAL | Status: DC
  Administered 2018-01-02 (×2): 1.5 via ORAL
  Filled 2018-01-01 (×2): qty 2

## 2018-01-01 MED ORDER — ACETAMINOPHEN 325 MG PO TABS: 650.0000 mg | ORAL_TABLET | Freq: Four times a day (QID) | ORAL | Status: DC | PRN

## 2018-01-01 MED ORDER — VITAMIN D 25 MCG (1000 UNIT) PO TABS
1000.0000 [IU] | ORAL_TABLET | Freq: Every day | ORAL | Status: DC
  Administered 2018-01-02: 1000 [IU] via ORAL
  Filled 2018-01-01: qty 1

## 2018-01-01 MED ORDER — ATORVASTATIN CALCIUM 10 MG PO TABS
20.0000 mg | ORAL_TABLET | Freq: Every day | ORAL | Status: DC
  Administered 2018-01-02: 20 mg via ORAL
  Filled 2018-01-01: qty 2

## 2018-01-01 MED ORDER — INSULIN ASPART 100 UNIT/ML ~~LOC~~ SOLN
0.0000 [IU] | Freq: Three times a day (TID) | SUBCUTANEOUS | Status: DC
  Administered 2018-01-02: 3 [IU] via SUBCUTANEOUS
  Administered 2018-01-02: 5 [IU] via SUBCUTANEOUS

## 2018-01-01 MED ORDER — ACARBOSE 25 MG PO TABS
25.0000 mg | ORAL_TABLET | Freq: Three times a day (TID) | ORAL | Status: DC
  Filled 2018-01-01: qty 1

## 2018-01-01 MED ORDER — SODIUM CHLORIDE 0.9 % IV BOLUS
1000.0000 mL | Freq: Once | INTRAVENOUS | Status: AC
  Administered 2018-01-01: 1000 mL via INTRAVENOUS

## 2018-01-01 MED ORDER — LOSARTAN POTASSIUM 50 MG PO TABS: 100.0000 mg | ORAL_TABLET | Freq: Every day | ORAL | Status: DC

## 2018-01-01 MED ORDER — INSULIN ASPART 100 UNIT/ML ~~LOC~~ SOLN
0.0000 [IU] | Freq: Every day | SUBCUTANEOUS | Status: DC
  Administered 2018-01-01: 5 [IU] via SUBCUTANEOUS

## 2018-01-01 MED ORDER — PREDNISONE 5 MG PO TABS: 30.0000 mg | ORAL_TABLET | Freq: Every day | ORAL | Status: DC

## 2018-01-01 MED ORDER — SODIUM CHLORIDE 0.9 % IV SOLN
INTRAVENOUS | Status: DC
  Administered 2018-01-01: via INTRAVENOUS

## 2018-01-01 MED ORDER — ENSURE ENLIVE PO LIQD
237.0000 mL | ORAL | Status: DC
  Administered 2018-01-02: 237 mL via ORAL
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
## 2018-01-01 NOTE — ED Notes (Signed)
Pt is in MRI  

## 2018-01-01 NOTE — ED Notes (Signed)
Patient transported to MRI 
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
## 2018-01-01 NOTE — ED Triage Notes (Signed)
Per [facility] pt had a near syncopal episode at home where he was staring off for less than a minute per his wife. Pt has boils on his abdomen and back that he has had since September. Pt has had several syncopal and near syncopal episodes since he started developing the boils. Pt reports that he is dizzy at this time.

## 2018-01-02 DIAGNOSIS — R404 Transient alteration of awareness: Secondary | ICD-10-CM

## 2018-01-02 DIAGNOSIS — G20 Parkinson's disease: Secondary | ICD-10-CM | POA: Diagnosis not present

## 2018-01-02 DIAGNOSIS — I10 Essential (primary) hypertension: Secondary | ICD-10-CM | POA: Diagnosis not present

## 2018-01-02 LAB — BASIC METABOLIC PANEL
Anion gap: 9 (ref 5–15)
BUN: 23 mg/dL (ref 8–23)
CO2: 22 mmol/L (ref 22–32)
Calcium: 8.3 mg/dL — ABNORMAL LOW (ref 8.9–10.3)
Chloride: 108 mmol/L (ref 98–111)
Creatinine, Ser: 1.25 mg/dL — ABNORMAL HIGH (ref 0.61–1.24)
GFR calc Af Amer: >60 mL/min (ref >60)
GFR calc non Af Amer: 53 mL/min — ABNORMAL LOW (ref >60)
Glucose, Bld: 336 mg/dL — ABNORMAL HIGH (ref 70–99)
Potassium: 4.2 mmol/L (ref 3.5–5.1)
Sodium: 139 mmol/L (ref 135–145)

## 2018-01-02 LAB — CBC
HCT: 36.7 % — ABNORMAL LOW (ref 39.0–52.0)
Hemoglobin: 11.2 g/dL — ABNORMAL LOW (ref 13.0–17.0)
MCH: 28.6 pg (ref 26.0–34.0)
MCHC: 30.5 g/dL (ref 30.0–36.0)
MCV: 93.9 fL (ref 80.0–100.0)
Platelets: 69 10*3/uL — ABNORMAL LOW (ref 150–400)
RBC: 3.91 MIL/uL — ABNORMAL LOW (ref 4.22–5.81)
RDW: 14.3 % (ref 11.5–15.5)
WBC: 5.9 10*3/uL (ref 4.0–10.5)
nRBC: 0 % (ref 0.0–0.2)

## 2018-01-02 LAB — GLUCOSE, CAPILLARY
Glucose-Capillary: 266 mg/dL — ABNORMAL HIGH (ref 70–99)
Glucose-Capillary: 214 mg/dL — ABNORMAL HIGH (ref 70–99)

## 2018-01-02 MED ORDER — GLUCOSE BLOOD VI STRP: ORAL_STRIP | 0 refills | Status: DC

## 2018-01-02 MED ORDER — ONETOUCH LANCETS MISC: 0 refills | Status: DC

## 2018-01-02 MED ORDER — ONETOUCH VERIO FLEX SYSTEM W/DEVICE KIT: PACK | 0 refills | Status: DC

## 2018-01-02 NOTE — Progress Notes (Signed)
NURSING PROGRESS NOTE  Dylan Orozco [id number] Admission Data: 01/02/2018 5:33 AM Attending Provider: Janora Norlander, MD PCP:Paz, Alda Berthold, MD Code Status: Full  Dylan Orozco is a 84 y.o. male patient admitted from ED:  -No acute distress noted.  -No complaints of shortness of breath.  -No complaints of chest pain.   Cardiac Monitoring: Box # 24 n place. Cardiac monitor yields:normal sinus rhythm.  Blood pressure 128/72, pulse 63, temperature (!) 97.4 F (36.3 C), temperature source Oral, resp. rate 16, height 5\' 9"  (1.753 m), weight 67.6 kg, SpO2 99 %.   IV Fluids:  IV in place, occlusive dsg intact without redness, IV cath wrist left, condition patent and no redness normal saline.   Allergies:  Tradjenta [linagliptin]; Pravastatin; and Ramipril  Past Medical History:   has a past medical history of Adenomatous colon polyp (2007 & 2013), Anxiety, Arthritis, Blind left eye, Cataract, Constipation, Depression, GERD (gastroesophageal reflux disease), Glaucoma, History of blood transfusion (1975), Hyperlipidemia, Hypertension, Pneumonia (1950s), Prostate CA ([place]) (2005), Trigeminy, and Type II diabetes mellitus ([name]).  Past Surgical History:   has a past surgical history that includes Appendectomy (1952); Refractive surgery (Bilateral, 2009); Mini shunt insertion (12/03/2011); Cataract extraction (Bilateral, 03-2014 ); Eye surgery; Colonoscopy w/ biopsies and polypectomy (2007 & 2013); Prostate biopsy (2019); and Exploratory laparotomy (01/09/1974-1986).  Social History:   reports that he quit smoking about 24 years ago. His smoking use included cigarettes. He has a 4.20 pack-year smoking history. He has never used smokeless tobacco. He reports current alcohol use. He reports that he does not use drugs.  Skin: patient has blisters covering his chest, abd and bilateral flanks. He has two recently blisters that have burst. A foam dsg was placed over the the site.   Patient/Family  orientated to room. Information packet given to patient/family. Admission inpatient armband information verified with patient/family to include name and date of birth and placed on patient arm. Side rails up x 2, fall assessment and education completed with patient/family. Patient/family able to verbalize understanding of risk associated with falls and verbalized understanding to call for assistance before getting out of bed. Call light within reach. Patient/family able to voice and demonstrate understanding of unit orientation instructions.    Will continue to evaluate and treat per MD orders.
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
## 2018-01-02 NOTE — Plan of Care (Signed)
Patient stable, discussed POC with patient, spouse, and daughter, agreeable with plan, denies question/concerns at this time.

## 2018-01-02 NOTE — Evaluation (Signed)
Physical Therapy Evaluation Patient Details Name: Dylan Orozco MRN: [id number] DOB: 1933-07-03 Today's Date: 01/02/2018   History of Present Illness  Pt is a 84 y.o. male with medical history significant for Parkinsonism, DM2, HTN, HLD, recently diagnosed AF/RVR newly on amiodarone and Eliquis who presented to the ED today with reported episode at home this morning of AMS. CT and MRI of brain were both negative for acute findings.     Clinical Impression  Pt present seated OOB in recliner chair with daughter present upon arrival. Prior to admission, pt reported that he was independent with ADLs and ambulates with a rollator. Pt currently able to perform transfers with supervision and ambulate in hallway with min guard while using a RW. Pt reported that he feels he is very close to his baseline in regards to mobility. Pt was working on getting HHPT prior to arrival and continue to recommend pt receive HHPT. Pt would continue to benefit from skilled physical therapy services at this time while admitted and after d/c to address the below listed limitations in order to improve overall safety and independence with functional mobility.     Follow Up Recommendations Home health PT;Supervision/Assistance - 24 hour    Equipment Recommendations  None recommended by PT    Recommendations for Other Services       Precautions / Restrictions Precautions Precautions: Fall Restrictions Weight Bearing Restrictions: No      Mobility  Bed Mobility   Transfers Overall transfer level: Needs assistance Equipment used: Rolling walker (2 wheeled) Transfers: Sit to/from Stand Sit to Stand: Supervision         General transfer comment: supervision for safety; cueing for safe hand placement  Ambulation/Gait Ambulation/Gait assistance: Min guard Gait Distance (Feet): 150 Feet Assistive device: Rolling walker (2 wheeled) Gait Pattern/deviations: Step-through pattern;Decreased stride  length Gait velocity: decreased   General Gait Details: pt generally steady with RW, no overt LOB or need for physical assistance, min guard for safety, cueing to maintain a safe distance from [employer] Mobility    Modified Rankin (Stroke Patients Only)       Balance Overall balance assessment: Needs assistance Sitting-balance support: No upper extremity supported;Feet supported Sitting balance-Leahy Scale: Good     Standing balance support: No upper extremity supported;During functional activity Standing balance-Leahy Scale: Fair Standing balance comment: standing at sink to wash face and hands                             Pertinent Vitals/Pain Pain Assessment: No/denies pain    Home Living Family/patient expects to be discharged to:: Private residence Living Arrangements: Spouse/significant other Available Help at Discharge: Family Type of Home: House Home Access: Level entry     Home Layout: One level Home Equipment: Cane - single point;Walker - 4 wheels Additional Comments: dtr checks in on them regularly    Prior Function Level of Independence: Independent with assistive device(s)         Comments: uses a rollator     Hand Dominance   Dominant Hand: Right    Extremity/Trunk Assessment   Upper Extremity Assessment Upper Extremity Assessment: Defer to OT evaluation    Lower Extremity Assessment Lower Extremity Assessment: Generalized weakness    Cervical / Trunk Assessment Cervical / Trunk Assessment: Kyphotic  Communication   Communication: HOH  Cognition Arousal/Alertness: Awake/alert Behavior During Therapy: WFL for  tasks assessed/performed Overall Cognitive Status: Within Functional Limits for tasks assessed                                        General Comments      Exercises     Assessment/Plan    PT Assessment Patient needs continued PT services  PT Problem List  Decreased strength;Decreased activity tolerance;Decreased balance;Decreased mobility;Decreased coordination;Decreased safety awareness       PT Treatment Interventions DME instruction;Gait training;Functional mobility training;Therapeutic activities;Balance training;Stair training;Therapeutic exercise;Neuromuscular re-education;Patient/family education    PT Goals (Current goals can be found in the Care Plan section)  Acute Rehab PT Goals Patient Stated Goal: to go home PT Goal Formulation: With patient/family Time For Goal Achievement: 01/16/18 Potential to Achieve Goals: Good    Frequency Min 3X/week   Barriers to discharge        Co-evaluation               AM-PAC PT "6 Clicks" Mobility  Outcome Measure Help needed turning from your back to your side while in a flat bed without using bedrails?: None Help needed moving from lying on your back to sitting on the side of a flat bed without using bedrails?: None Help needed moving to and from a bed to a chair (including a wheelchair)?: None Help needed standing up from a chair using your arms (e.g., wheelchair or bedside chair)?: A Little Help needed to walk in hospital room?: A Little Help needed climbing 3-5 steps with a railing? : A Little 6 Click Score: 21    End of Session Equipment Utilized During Treatment: Gait belt Activity Tolerance: Patient tolerated treatment well Patient left: in chair;with call bell/phone within reach;with chair alarm set;with family/visitor present Nurse Communication: Mobility status PT Visit Diagnosis: Other abnormalities of gait and mobility (R26.89);Unsteadiness on feet (R26.81)    Time: 1043-1101 PT Time Calculation (min) (ACUTE ONLY): 18 min   Charges:   PT Evaluation $PT Eval Moderate Complexity: 1 Mod          Sherie Don, PT, DPT  Acute Rehabilitation Services Pager [phone] Office [place] 01/02/2018, 12:22 PM

## 2018-01-02 NOTE — Discharge Instructions (Addendum)
[facility] were admitted because of a staring spell concerning for possible seizure. You were evaluated and monitored overnight. The neurologist thinks this is secondary to your parkinson disease. Please follow-up with your primary care physician and neurologist.   Information on my medicine - ELIQUIS (apixaban)  This medication education was reviewed with me or my healthcare representative as part of my discharge preparation.    Why was Eliquis prescribed for you? Eliquis was prescribed for you to reduce the risk of a blood clot forming that can cause a stroke if you have a medical condition called atrial fibrillation (a type of irregular heartbeat).  What do You need to know about Eliquis ? Take your Eliquis TWICE DAILY - one tablet in the morning and one tablet in the evening with or without food. If you have difficulty swallowing the tablet whole please discuss with your pharmacist how to take the medication safely.  Take Eliquis exactly as prescribed by your doctor and DO NOT stop taking Eliquis without talking to the doctor who prescribed the medication.  Stopping may increase your risk of developing a stroke.  Refill your prescription before you run out.  After discharge, you should have regular check-up appointments with your healthcare provider that is prescribing your Eliquis.  In the future your dose may need to be changed if your kidney function or weight changes by a significant amount or as you get older.  What do you do if you miss a dose? If you miss a dose, take it as soon as you remember on the same day and resume taking twice daily.  Do not take more than one dose of ELIQUIS at the same time to make up a missed dose.  Important Safety Information A possible side effect of Eliquis is bleeding. You should call your healthcare provider right away if you experience any of the following: ? Bleeding from an injury or your nose that does not stop. ? Unusual  colored urine (red or dark brown) or unusual colored stools (red or black). ? Unusual bruising for unknown reasons. ? A serious fall or if you hit your head (even if there is no bleeding).  Some medicines may interact with Eliquis and might increase your risk of bleeding or clotting while on Eliquis. To help avoid this, consult your healthcare provider or pharmacist prior to using any new prescription or non-prescription medications, including herbals, vitamins, non-steroidal anti-inflammatory drugs (NSAIDs) and supplements.  This website has more information on Eliquis (apixaban): http://www.eliquis.com/eliquis/home

## 2018-01-02 NOTE — Progress Notes (Signed)
Inpatient Diabetes Program Recommendations  AACE/ADA: New Consensus Statement on Inpatient Glycemic Control (2015)  Target Ranges:  Prepandial:   less than 140 mg/dL      Peak postprandial:   less than 180 mg/dL (1-2 hours)      Critically ill patients:  140 - 180 mg/dL   Lab Results  Component Value Date   GLUCAP 266 (H) 01/02/2018   HGBA1C 7.6 (H) 06/26/202019    Review of Glycemic Control Results for Dylan Orozco, Dylan Orozco (MRN [id number]) as of 01/02/2018 11:22  Ref. Range 01/01/2018 12:42 01/01/2018 20:47 01/02/2018 06:12  Glucose-Capillary Latest Ref Range: 70 - 99 mg/dL 339 (H) 372 (H) 266 (H)   Diabetes history: Type 2 DM Outpatient Diabetes medications: Actos 30 mg QD, Amaryl 2 mg QAM Current orders for Inpatient glycemic control: Novolog 0-9 units TID, Novolog 0-5 units QHS  Inpatient Diabetes Program Recommendations:    Trends continue to exceed inpatient goals. Consider adding on some basal insulin: Levemir 10 units QD.  Thanks, Bronson Curb, MSN, RNC-OB Diabetes Coordinator [phone] (8a-5p)

## 2018-01-02 NOTE — Care Management Note (Signed)
Case Management Note RN CM cross coverage for [phone]  Patient Details  Name: Dylan Orozco MRN: [id number] Date of Birth: April 08, 1933  Subjective/Objective:  Pt presented with AMS                  Action/Plan: PTA pt lived at home, active with [facility] for HHPT/OT- spoke with pt and daughter who state that they would like to continue services with [facility]. Notified Tiffany with [facility] for resumption of care. No other CM needs identified. Pt/daughter provided with CMS list for [facility] agencies.   Expected Discharge Date:  01/02/18               Expected Discharge Plan:  [place]  In-House Referral:     Discharge planning Services  CM Consult  Post Acute Care Choice:  Home Health, Resumption of Svcs/PTA Provider Choice offered to:  Patient, Adult Children  DME Arranged:    DME Agency:     HH Arranged:  PT, OT HH Agency:  [facility]  Status of Service:  Completed, signed off  If discussed at [place] of Stay Meetings, dates discussed:   Discharge Disposition: home/home health    Additional Comments:  Dawayne Patricia, RN 01/02/2018, 1:51 PM

## 2018-01-02 NOTE — Evaluation (Signed)
Occupational Therapy Evaluation and Discharge Patient Details Name: Dylan Orozco MRN: [id number] DOB: 01-21-1934 Today's Date: 01/02/2018    History of Present Illness  Dylan Orozco is a 84 y.o. male with medical history significant for Parkinsonism, DM2, HTN, HLD, recently diagnosed AF/RVR newly on amiodarone and Eliquis who presented to the ED today with reported episode at home this morning of AMS--Apparently there was a 2-3 minute episode where pt was sitting at the breakfast table just after eating when he began slurring his speech and at one point staring off into space, unaware of what was going on. MRI negative.   Clinical Impression   This 84 yo male admitted with above presents to acute at a setup-S level in this environment due to decreased vision and unfamiliar environment. No further acute OT needs, we will sign off with recommendation of HHOT and 24 hour S/prn A from wife and dtr. I did make dtr aware she may want to contact services for the blind to see if there are any services they could offer her dad--she verbalized understanding.    Follow Up Recommendations  Home health OT    Equipment Recommendations  None recommended by OT       Precautions / Restrictions Precautions Precautions: Fall Restrictions Weight Bearing Restrictions: No      Mobility Bed Mobility Overal bed mobility: Modified Independent Bed Mobility: Supine to Sit     Supine to sit: Modified independent (Device/Increase time)     General bed mobility comments: increased time, HOB flat and no rail, coming out on left hand side as he does at home  Transfers Overall transfer level: Needs assistance Equipment used: Rolling walker (2 wheeled) Transfers: Sit to/from Stand Sit to Stand: Supervision         General transfer comment: cues for safety (pt wanting to pull up on RW, used to pulling up on rollator)    Balance Overall balance assessment: Needs assistance Sitting-balance  support: No upper extremity supported;Feet supported Sitting balance-Leahy Scale: Good     Standing balance support: No upper extremity supported;During functional activity Standing balance-Leahy Scale: Fair Standing balance comment: standing at sink to wash face and hands                           ADL either performed or assessed with clinical judgement   ADL Overall ADL's : Needs assistance/impaired Eating/Feeding: Set up;Supervision/ safety Eating/Feeding Details (indicate cue type and reason): due to decreased vision and unfamiliar environment Grooming: Wash/dry face;Standing;Supervision/safety;Set up Grooming Details (indicate cue type and reason): pt able to maintain standing at sink; needed A due to due to decreased vision and unfamiliar environment Upper Body Bathing: Supervision/ safety;Set up Upper Body Bathing Details (indicate cue type and reason): due to decreased vision and unfamiliar environment Lower Body Bathing: Supervison/ safety;Set up;Sit to/from stand Lower Body Bathing Details (indicate cue type and reason): due to decreased vision and unfamiliar environment Upper Body Dressing : Set up;Supervision/safety;Sitting Upper Body Dressing Details (indicate cue type and reason): due to decreased vision and unfamiliar environment Lower Body Dressing: Supervision/safety;Set up;Sit to/from stand Lower Body Dressing Details (indicate cue type and reason): due to decreased vision and unfamiliar environment Toilet Transfer: Supervision/safety;Regular Toilet;RW;Grab bars Toilet Transfer Details (indicate cue type and reason): due to decreased vision and unfamiliar environment Toileting- Clothing Manipulation and Hygiene: Set up;Sit to/from stand Toileting - Clothing Manipulation Details (indicate cue type and reason): due to decreased vision and unfamiliar environment  Vision Baseline Vision/History: Legally blind;Glaucoma(blind in left eye and  decreased vision right eye) Additional Comments: Had difficulty finding sock in his right visual field--I had to move it around for him to find it. He reports that he does have trouble seeing even with his right eye depending on light/dark of room. When having him to try and find my pen--he did better if I was moving it, but he did move his head around to find it.            Pertinent Vitals/Pain Pain Assessment: No/denies pain     Hand Dominance Right   Extremity/Trunk Assessment Upper Extremity Assessment Upper Extremity Assessment: Overall WFL for tasks assessed           Communication Communication Communication: HOH   Cognition Arousal/Alertness: Awake/alert Behavior During Therapy: WFL for tasks assessed/performed Overall Cognitive Status: Within Functional Limits for tasks assessed                                                Home Living Family/patient expects to be discharged to:: Private residence Living Arrangements: Spouse/significant other Available Help at Discharge: Family Type of Home: House Home Access: Level entry     Home Layout: One level     Bathroom Shower/Tub: Occupational psychologist: Standard     Home Equipment: Cane - single point;Walker - 4 wheels   Additional Comments: dtr checks in on them regularly      Prior Functioning/Environment Level of Independence: Independent with assistive device(s)        Comments: uses a rollator        OT Problem List: Impaired balance (sitting and/or standing)         OT Goals(Current goals can be found in the care plan section) Acute Rehab OT Goals Patient Stated Goal: to go home  OT Frequency:                AM-PAC OT "6 Clicks" Daily Activity     Outcome Measure Help from another person eating meals?: A Little Help from another person taking care of personal grooming?: A Little Help from another person toileting, which includes using toliet, bedpan, or  urinal?: A Little Help from another person bathing (including washing, rinsing, drying)?: A Little Help from another person to put on and taking off regular upper body clothing?: A Little Help from another person to put on and taking off regular lower body clothing?: A Little 6 Click Score: 18   End of Session Equipment Utilized During Treatment: Gait belt;Rolling walker Nurse Communication: (recommending HHOT)  Activity Tolerance: Patient tolerated treatment well Patient left: in chair;with call bell/phone within reach;with chair alarm set  OT Visit Diagnosis: Unsteadiness on feet (R26.81);History of falling (Z91.81)                Time: 6222-9798 OT Time Calculation (min): 44 min Charges:  OT General Charges $OT Visit: 1 Visit OT Evaluation $OT Eval Moderate Complexity: 1 Mod OT Treatments $Self Care/Home Management : 23-37 mins [place], OTR/L Acute [employer] Pager [phone] Office [phone]     Almon Register 01/02/2018, 10:22 AM

## 2018-01-03 ENCOUNTER — Telehealth: Payer: Self-pay

## 2018-01-03 NOTE — Telephone Encounter (Signed)
Orders received from [facility] [place]- form signed and faxed to [phone]. Form sent for scanning.

## 2018-01-08 ENCOUNTER — Telehealth: Payer: Self-pay

## 2018-01-08 NOTE — Telephone Encounter (Signed)
thx

## 2018-01-08 NOTE — Telephone Encounter (Signed)
01/08/18  Transition Care Management Follow-up Telephone Call  ADMISSION DATE: 01/01/18 DISCHARGE DATE: 01/02/18  BMP AND CBC TO BE COLLECTED DURING VISIT.   How have you been since you were released from the hospital? Feeling fair per patient.   Do you understand why you were in the hospital? Yes   Do you understand the discharge instrcutions? Yes    Items Reviewed:  Medications reviewed: Yes, some medications patient not taking.  Allergies reviewed: Yes   Dietary changes reviewed: Diabetic,heart healthy  Referrals reviewed: Appointment scheduled.  Functional Questionnaire:   Activities of Daily Living (ADLs): Patient states he uses walker for ambulation but can perform all all other ADL's independently.  Any patient concerns? None at this time.   Confirmed importance and date/time of follow-up visits scheduled: Yes   Confirmed with patient if condition begins to worsen call PCP or go to the ER. Yes    Patient was given the office number and encouragred to call back with questions or concerns.Yes

## 2018-01-09 ENCOUNTER — Encounter: Payer: Self-pay | Admitting: Internal Medicine

## 2018-01-09 ENCOUNTER — Telehealth: Payer: Self-pay

## 2018-01-09 ENCOUNTER — Ambulatory Visit: Payer: Medicare Other | Admitting: Internal Medicine

## 2018-01-09 VITALS — BP 142/90 | HR 67 | Temp 97.8°F | Resp 16 | Ht 69.0 in | Wt 149.0 lb

## 2018-01-09 DIAGNOSIS — Z8546 Personal history of malignant neoplasm of prostate: Secondary | ICD-10-CM

## 2018-01-09 DIAGNOSIS — E119 Type 2 diabetes mellitus without complications: Secondary | ICD-10-CM

## 2018-01-09 DIAGNOSIS — R404 Transient alteration of awareness: Secondary | ICD-10-CM | POA: Diagnosis not present

## 2018-01-09 DIAGNOSIS — E1169 Type 2 diabetes mellitus with other specified complication: Secondary | ICD-10-CM | POA: Diagnosis not present

## 2018-01-09 DIAGNOSIS — G20 Parkinson's disease: Secondary | ICD-10-CM

## 2018-01-09 DIAGNOSIS — D696 Thrombocytopenia, unspecified: Secondary | ICD-10-CM | POA: Diagnosis not present

## 2018-01-09 DIAGNOSIS — L109 Pemphigus, unspecified: Secondary | ICD-10-CM

## 2018-01-09 LAB — CBC WITH DIFFERENTIAL/PLATELET
Basophils Absolute: 0 10*3/uL (ref 0.0–0.1)
Basophils Relative: 0.4 % (ref 0.0–3.0)
EOS PCT: 7.1 % — AB (ref 0.0–5.0)
Eosinophils Absolute: 0.4 10*3/uL (ref 0.0–0.7)
HCT: 40.4 % (ref 39.0–52.0)
Hemoglobin: 13.1 g/dL (ref 13.0–17.0)
Lymphocytes Relative: 6.3 % — ABNORMAL LOW (ref 12.0–46.0)
Lymphs Abs: 0.4 10*3/uL — ABNORMAL LOW (ref 0.7–4.0)
MCHC: 32.5 g/dL (ref 30.0–36.0)
MCV: 91.6 fl (ref 78.0–100.0)
Monocytes Absolute: 0.4 10*3/uL (ref 0.1–1.0)
Monocytes Relative: 7.2 % (ref 3.0–12.0)
Neutro Abs: 4.8 10*3/uL (ref 1.4–7.7)
Neutrophils Relative %: 79 % — ABNORMAL HIGH (ref 43.0–77.0)
Platelets: 73 10*3/uL — ABNORMAL LOW (ref 150.0–400.0)
RBC: 4.41 Mil/uL (ref 4.22–5.81)
RDW: 14.4 % (ref 11.5–15.5)
WBC: 6.1 10*3/uL (ref 4.0–10.5)

## 2018-01-09 LAB — POCT GLUCOSE (DEVICE FOR HOME USE): GLUCOSE FASTING, POC: 413 mg/dL — AB (ref 70–99)

## 2018-01-09 LAB — BASIC METABOLIC PANEL
BUN: 21 mg/dL (ref 6–23)
CO2: 28 mEq/L (ref 19–32)
Calcium: 9.1 mg/dL (ref 8.4–10.5)
Chloride: 106 mEq/L (ref 96–112)
Creatinine, Ser: 1.18 mg/dL (ref 0.40–1.50)
GFR: 75.86 mL/min (ref >60.00)
Glucose, Bld: 285 mg/dL — ABNORMAL HIGH (ref 70–99)
Potassium: 4.4 mEq/L (ref 3.5–5.1)
Sodium: 142 mEq/L (ref 135–145)

## 2018-01-09 NOTE — Assessment & Plan Note (Signed)
Behavioral spell: As described above status post admission to the hospital, he is now back to baseline, MRI of the brain was negative.  Per discharge summary, episode felt to be related to Parkinson.  Will refer to neurology for outpatient evaluation. We will check BMP and CBC Parkinson's: As above DM: Last A1c 7.6 on 11/28/2017, satisfactory for his age group.  Currently on Actos, not taking glimepiride or acarbose. CBG is 413.  Strongly recommend him to go back to glimepiride and add a carbose.  Nurse visit in 1 week to check his CBG.  Risks of uncontrolled DM discussed with patient HTN: BP slightly elevated, currently on losartan, reportedly slightly elevated at home, come back in 1 week for nurse visit Atrial fibrillation: On Eliquis and Pacerone, rate controlled. Dizziness: See last visit, symptoms are back to baseline. Prostate cancer, history of.  Complaining of severe nocturia, had a cystoscopy 9- 2019, normal, nocturia is worse since then.  Recent UA at the hospital negative.  No fever or chills.  Recommend to see urology again. Thrombocytopenia: Chronic, worse lately, no previous work-up to my knowledge.  Slightly worse lately.  Will refer to hematology Pemphigus: I spoke with Dr. Delman Cheadle, dermatology, she is concerned about thrombocytopenia since he needs CellCept, we are referring him to hematology.  Addendum: I noticed that the patient is not taking CellCept or prednisone as recommended by dermatology.  Will let Dr Delman Cheadle know. RTC 1 week for nurse visit RTC 6 weeks to see PCP

## 2018-01-09 NOTE — Telephone Encounter (Signed)
Orders received from [facility]- orders signed and faxed to [phone]. Form sent for scanning.
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
## 2018-01-09 NOTE — Progress Notes (Signed)
Pre visit review using our clinic review tool, if applicable. No additional management support is needed unless otherwise documented below in the visit note. 

## 2018-01-09 NOTE — Telephone Encounter (Signed)
Opened in error

## 2018-01-09 NOTE — Patient Instructions (Signed)
GO TO THE LAB : Get the blood work     GO TO THE FRONT DESK Schedule your next appointment for a nurse visit in 1 week for blood pressure and blood sugar check  Schedule your next appointment to see me in 6 weeks  Please take all your medications including glimepiride and a carbose.

## 2018-01-10 ENCOUNTER — Telehealth: Payer: Self-pay | Admitting: Internal Medicine

## 2018-01-10 ENCOUNTER — Other Ambulatory Visit: Payer: Self-pay | Admitting: Internal Medicine

## 2018-01-10 NOTE — Telephone Encounter (Signed)
Spoke w/ Will, verbal orders given.

## 2018-01-10 NOTE — Telephone Encounter (Signed)
Refill request for hydroxyzine. No longer on med list. Please advise.

## 2018-01-10 NOTE — Telephone Encounter (Addendum)
1.  Spoke with the patient, inform him that his blood work looks good except for low platelets and he has been referred to hematology. 2.  He request hydroxyzine for itching, I asked him if his pemphigoid lesions (blisters) were coming back and he said no, he is just a slightly itchy (probably where the lesions were).  I confirmed that he is not taking Cepcell or prednisone at this point.  I advised him to call me or his dermatologist if he has new blisters 3.  Okay to send hydroxyzine No. 30 and 1 refill.

## 2018-01-10 NOTE — Telephone Encounter (Signed)
Rx sent 

## 2018-01-10 NOTE — Telephone Encounter (Signed)
Copied from [place] [id number]. Topic: Quick Communication - Home Health Verbal Orders >> Jan 10, 2018  4:05 PM Rutherford Nail, [place] wrote: Caller/Agency: [facility] [place] Number: [phone] Requesting OT/PT/Skilled Nursing/Social Work: OT Frequency:   Extend  1x a week for 2 weeks 2x a week for 1 week

## 2018-01-13 ENCOUNTER — Telehealth: Payer: Self-pay | Admitting: Internal Medicine

## 2018-01-13 ENCOUNTER — Telehealth: Payer: Self-pay | Admitting: *Deleted

## 2018-01-13 ENCOUNTER — Telehealth: Payer: Self-pay | Admitting: Hematology & Oncology

## 2018-01-13 NOTE — Telephone Encounter (Signed)
Spoke w/ [place]- verbal orders given.  

## 2018-01-13 NOTE — Telephone Encounter (Signed)
Spoke with patient to confirm new patient appointment 01/23/18 at 1030 am

## 2018-01-13 NOTE — Telephone Encounter (Signed)
Copied from [place] [id number]. Topic: Quick Communication - Home Health Verbal Orders >> Jan 13, 2018  2:17 PM Sheran Luz wrote: Caller/Agency: Con[facility]  Callback Number: [phone] Requesting OT/PT/Skilled Nursing/Social Work: PT Frequency: 1w1, 2w3

## 2018-01-13 NOTE — Telephone Encounter (Signed)
Received Missed Visit Report from [facility]; forwarded to provider/SLS 12/23

## 2018-01-16 ENCOUNTER — Telehealth: Payer: Self-pay | Admitting: *Deleted

## 2018-01-16 NOTE — Telephone Encounter (Signed)
Received Physician Orders from [facility]; forwarded to provider/SLS 12/26

## 2018-01-20 ENCOUNTER — Other Ambulatory Visit: Payer: Self-pay | Admitting: Family

## 2018-01-20 ENCOUNTER — Telehealth: Payer: Self-pay | Admitting: *Deleted

## 2018-01-20 DIAGNOSIS — D696 Thrombocytopenia, unspecified: Secondary | ICD-10-CM

## 2018-01-20 NOTE — Telephone Encounter (Signed)
Received Physician Orders from [facility]; forwarded to provider/SLS 12/30

## 2018-01-23 ENCOUNTER — Ambulatory Visit: Payer: Medicare Other | Admitting: Family

## 2018-01-23 ENCOUNTER — Telehealth: Payer: Self-pay | Admitting: Internal Medicine

## 2018-01-23 ENCOUNTER — Inpatient Hospital Stay: Payer: Medicare Other | Attending: Hematology & Oncology

## 2018-01-23 ENCOUNTER — Ambulatory Visit: Payer: Medicare Other | Admitting: Internal Medicine

## 2018-01-23 NOTE — Telephone Encounter (Signed)
Orders signed and faxed to [facility] at [phone]. Form sent for scanning.

## 2018-01-23 NOTE — Telephone Encounter (Signed)
Copied from [place] [id number]. Topic: Quick Communication - See Telephone Encounter >> Jan 23, 2018 10:09 AM Rosalin Hawking wrote: CRM for notification. See Telephone encounter for: 01/23/18.   LVM for pt to call the office, (pt came in late for appt 9:37 am and had to reschedule) Per Larose Kells wanted to see pt today and wanted to see if pt can come in before 11:00 today if not today in the afternoon.

## 2018-01-24 ENCOUNTER — Encounter: Payer: Self-pay | Admitting: Internal Medicine

## 2018-01-24 ENCOUNTER — Ambulatory Visit (INDEPENDENT_AMBULATORY_CARE_PROVIDER_SITE_OTHER): Payer: Medicare Other | Admitting: Internal Medicine

## 2018-01-24 VITALS — BP 116/60 | HR 77 | Temp 97.5°F | Resp 16 | Ht 69.0 in | Wt 144.0 lb

## 2018-01-24 DIAGNOSIS — D696 Thrombocytopenia, unspecified: Secondary | ICD-10-CM

## 2018-01-24 DIAGNOSIS — E119 Type 2 diabetes mellitus without complications: Secondary | ICD-10-CM | POA: Diagnosis not present

## 2018-01-24 DIAGNOSIS — I10 Essential (primary) hypertension: Secondary | ICD-10-CM

## 2018-01-24 DIAGNOSIS — E1165 Type 2 diabetes mellitus with hyperglycemia: Secondary | ICD-10-CM | POA: Diagnosis not present

## 2018-01-24 DIAGNOSIS — L109 Pemphigus, unspecified: Secondary | ICD-10-CM | POA: Diagnosis not present

## 2018-01-24 LAB — POCT GLUCOSE (DEVICE FOR HOME USE): POC Glucose: 491 mg/dl — AB (ref 70–99)

## 2018-01-24 MED ORDER — GLIMEPIRIDE 2 MG PO TABS: 6.0000 mg | ORAL_TABLET | Freq: Every day | ORAL | 3 refills | Status: DC

## 2018-01-24 NOTE — Progress Notes (Signed)
Pre visit review using our clinic review tool, if applicable. No additional management support is needed unless otherwise documented below in the visit note. 

## 2018-01-24 NOTE — Patient Instructions (Addendum)
Increase glimepiride 2 mg to 3 tablets in the morning  All the other medications are the same  Drink plenty fluids  Check your blood sugar twice a day: Early in the morning fasting and before your dinner  Call in 3 days (Monday) and tell me your blood sugar readings.  Call if your blood sugars are less than 150
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
## 2018-01-26 DIAGNOSIS — L109 Pemphigus, unspecified: Secondary | ICD-10-CM | POA: Insufficient documentation

## 2018-01-26 NOTE — Assessment & Plan Note (Signed)
DM: Today the patient and his wife report that he is actually  taking Actos, glimepiride and a carbose. No recent ambulatory CBGs.  CBG today 491 We discussed several options including a ER visit, admission,  insulin but all of them were somewhat problematic to the patient particularly the injections.   They don't think ar able to provide injections. Plan: Increase glimepiride 2mg : 3 tablets a day, continue other medications, check CBGs daily, call with readings on Monday (in 3 days).  Watch for low CBGs. ER if problems. HTN: Currently on losartan, BP today is very good A. fib: Heart rate 77 today, seems regular.  Continue amiodarone, Eliquis Pemphigus: Currently the problem is quiet, not requiring hydroxyzine, tapering off prednisone. Thrombocytopenia: Hematology referral sent . Dizziness: Ongoing problem

## 2018-01-27 ENCOUNTER — Telehealth: Payer: Self-pay

## 2018-01-27 NOTE — Telephone Encounter (Signed)
Patients wife states patient changed his mind and would like for Home Health([facility]) to come to his home 3 days per week to check his Blood Sugar if he qualifies. Please advise

## 2018-01-27 NOTE — Telephone Encounter (Signed)
Follow up call made to patient regarding his request to have someone come to his home to check his bloos sugar. Wife states it was just for  That day. Advised her to call back if she feels patient needs Home Health nurse to start seeing patient and we could give infor to Dr. Larose Kells. Wife agreed. Patient is doing ok today per wife.

## 2018-01-27 NOTE — Telephone Encounter (Signed)
Spoke with the patient, he is taking medications as prescribed but has not been able to check CBGs, reports poor vision. He asked me to grant permission to a home health agency to check CBGs 3 times a week. They will call me and permission will be granted.

## 2018-01-29 ENCOUNTER — Other Ambulatory Visit: Payer: Self-pay | Admitting: Internal Medicine

## 2018-01-29 MED ORDER — AMIODARONE HCL 200 MG PO TABS: 200.0000 mg | ORAL_TABLET | Freq: Two times a day (BID) | ORAL | 1 refills | Status: DC

## 2018-01-29 MED ORDER — APIXABAN 5 MG PO TABS: 5.0000 mg | ORAL_TABLET | Freq: Two times a day (BID) | ORAL | 1 refills | Status: DC

## 2018-01-29 NOTE — Telephone Encounter (Signed)
Requested medication (s) are due for refill today: Yes  Requested medication (s) are on the active medication list: Yes  Last refill:  12/17/17  Future visit scheduled: Yes  Notes to clinic:  Unable to refill per protocol, last refilled by another provider, not delegated.     Requested Prescriptions  Pending Prescriptions Disp Refills   amiodarone (PACERONE) 200 MG tablet 60 tablet 0    Sig: Take 1 tablet (200 mg total) by mouth 2 (two) times daily.     Not Delegated - Cardiovascular: Antiarrhythmic Agents - amiodarone Failed - 01/29/2018 11:02 AM      Failed - This refill cannot be delegated      Passed - TSH in normal range and within 180 days    TSH  Date Value Ref Range Status  12/26/2017 0.47 0.35 - 4.50 uIU/mL Final         Passed - Mg Level in normal range and within 180 days    Magnesium  Date Value Ref Range Status  12/14/2017 1.9 1.7 - 2.4 mg/dL Final    Comment:    Performed at [facility], [place] [address], [place], [place] [address]         Passed - K in normal range and within 180 days    Potassium  Date Value Ref Range Status  01/09/2018 4.4 3.5 - 5.1 mEq/L Final         Passed - Ca in normal range and within 180 days    Calcium  Date Value Ref Range Status  01/09/2018 9.1 8.4 - 10.5 mg/dL Final         Passed - AST in normal range and within 180 days    AST  Date Value Ref Range Status  01/01/2018 24 15 - 41 U/L Final         Passed - ALT in normal range and within 180 days    ALT  Date Value Ref Range Status  01/01/2018 7 0 - 44 U/L Final         Passed - Patient had ECG in the last 180 days.      Passed - Last BP in normal range    BP Readings from Last 1 Encounters:  01/24/18 116/60         Passed - Last Heart Rate in normal range    Pulse Readings from Last 1 Encounters:  01/24/18 77         Passed - Valid encounter within last 6 months    Recent Outpatient Visits          5 days ago Uncontrolled type 2 diabetes  mellitus with hyperglycemia ([place])   Archivist at [place], MD   2 weeks ago Transient alteration of Naval architect at [place], MD   1 month ago Atrial fibrillation with [facility])   Archivist at [place], MD   1 month ago Syncope, unspecified syncope type   Archivist at [place], MD   2 months ago Controlled diabetes mellitus type 2 with complications, unspecified whether long term insulin use ([place])   Archivist at [place], MD      Future Appointments            In 3 weeks [place], [place]  E, MD [place] [facility]          apixaban (ELIQUIS) 5 MG TABS tablet 60 tablet 0    Sig: Take 1 tablet (5 mg total) by mouth 2 (two) times daily.     Hematology:  Anticoagulants Failed - 01/29/2018 11:02 AM      Failed - PLT in normal range and within 360 days    Platelets  Date Value Ref Range Status  01/09/2018 73.0 (L) 150.0 - 400.0 K/uL Final         Passed - HGB in normal range and within 360 days    Hemoglobin  Date Value Ref Range Status  01/09/2018 13.1 13.0 - 17.0 g/dL Final         Passed - HCT in normal range and within 360 days    HCT  Date Value Ref Range Status  01/09/2018 40.4 39.0 - 52.0 % Final         Passed - Cr in normal range and within 360 days    Creatinine, Ser  Date Value Ref Range Status  01/09/2018 1.18 0.40 - 1.50 mg/dL Final         Passed - Valid encounter within last 12 months    Recent Outpatient Visits          5 days ago Uncontrolled type 2 diabetes mellitus with hyperglycemia ([place])   Archivist at [place], MD   2 weeks ago Transient alteration of Naval architect at [place], MD   1 month ago  Atrial fibrillation with [facility])   Archivist at [place], MD   1 month ago Syncope, unspecified syncope type   Archivist at [place], MD   2 months ago Controlled diabetes mellitus type 2 with complications, unspecified whether long term insulin use ([place])   Archivist at [place], MD      Future Appointments            In 3 weeks Larose Kells, Alda Berthold, MD [employer] at [place]

## 2018-01-29 NOTE — Telephone Encounter (Signed)
Copied from [place] [id number]. Topic: Quick Communication - Rx Refill/Question >> Jan 29, 2018  8:33 AM Bea Graff, NT wrote: Medication: apixaban (ELIQUIS) 5 MG TABS tablet and amiodarone (PACERONE) 200 MG tablet  Wife states her husband is out and that the pharmacy stated they faxed a request multiple times. She states her husband is feeling ok today but yesterday had tightness in his chest. She would like to see if this can be sent asap.  Has the patient contacted their pharmacy? Yes.   (Agent: If no, request that the patient contact the pharmacy for the refill.) (Agent: If yes, when and what did the pharmacy advise?)  Preferred Pharmacy (with phone number or street name): [id number] [place], [place] - [place] [phone] (Phone) [phone] (Fax)    Agent: Please be advised that RX refills may take up to 3 business days. We ask that you follow-up with your pharmacy.

## 2018-01-29 NOTE — Telephone Encounter (Signed)
Patient's wife called and asked is the patient having chest tightness/pain at this time, she denies and said it was tightness yesterday. She says that the doctor told her the amiodarone will help with the chest tightness and he is out of the medication. She says the pharmacy said they faxed it on Saturday, so she called today and they said they never received a refill from Dr. Larose Kells. I advised I will send over to Dr. Larose Kells for refill.

## 2018-01-31 ENCOUNTER — Telehealth: Payer: Self-pay | Admitting: *Deleted

## 2018-01-31 NOTE — Telephone Encounter (Signed)
Received Missed Visit Report from [facility]; forwarded to provider/SLS 01/10

## 2018-02-04 ENCOUNTER — Telehealth: Payer: Self-pay | Admitting: Internal Medicine

## 2018-02-04 MED ORDER — GLIMEPIRIDE 2 MG PO TABS: 8.0000 mg | ORAL_TABLET | Freq: Every day | ORAL | 3 refills | Status: DC

## 2018-02-04 NOTE — Telephone Encounter (Signed)
Okay to refer him forOT, PT, skilled nursing, social work. Increase glimepiride to 4 tablets daily Call with CBGs in 2 weeks

## 2018-02-04 NOTE — Telephone Encounter (Signed)
Please advise 

## 2018-02-04 NOTE — Telephone Encounter (Signed)
Copied from [place] [id number]. Topic: Quick Communication - Home Health Verbal Orders >> Feb 04, 2018  1:58 PM Windy Kalata wrote: Caller/Agency: Dorian Pod from [facility] Number: [phone] Requesting OT/PT/Skilled Nursing/Social Work: Skilled Nursing, continue care for diabetes, blood  sugar was 337 today and last week was 349. Wife is not able to do blood sugar she is afraid of needles and the patient is blind.  Frequency: 2 week 2, counting todays visit

## 2018-02-04 NOTE — Telephone Encounter (Signed)
Spoke w/ Dorian Pod, verbal orders given. Informed of PCP recommendations. She will inform Pt- med list updated.

## 2018-02-04 NOTE — Telephone Encounter (Signed)
Request for continuation of care for diabetes management. Please review for new order.

## 2018-02-10 NOTE — Telephone Encounter (Signed)
Signed and faxed to [facility] at [phone]. Form sent for scanning.

## 2018-02-13 ENCOUNTER — Emergency Department (HOSPITAL_COMMUNITY)
Admission: EM | Admit: 2018-02-13 | Discharge: 2018-02-13 | Disposition: A | Discharge status: Home / Self Care | Payer: Medicare Other | Attending: Emergency Medicine | Admitting: Emergency Medicine

## 2018-02-13 ENCOUNTER — Other Ambulatory Visit: Payer: Self-pay

## 2018-02-13 ENCOUNTER — Encounter (HOSPITAL_COMMUNITY): Payer: Self-pay | Admitting: Emergency Medicine

## 2018-02-13 ENCOUNTER — Emergency Department (HOSPITAL_COMMUNITY): Payer: Medicare Other

## 2018-02-13 DIAGNOSIS — Z79899 Other long term (current) drug therapy: Secondary | ICD-10-CM | POA: Insufficient documentation

## 2018-02-13 DIAGNOSIS — R42 Dizziness and giddiness: Secondary | ICD-10-CM | POA: Diagnosis not present

## 2018-02-13 DIAGNOSIS — I10 Essential (primary) hypertension: Secondary | ICD-10-CM | POA: Diagnosis not present

## 2018-02-13 DIAGNOSIS — Y92009 Unspecified place in unspecified non-institutional (private) residence as the place of occurrence of the external cause: Secondary | ICD-10-CM | POA: Insufficient documentation

## 2018-02-13 DIAGNOSIS — Z87891 Personal history of nicotine dependence: Secondary | ICD-10-CM | POA: Diagnosis not present

## 2018-02-13 DIAGNOSIS — W19XXXA Unspecified fall, initial encounter: Secondary | ICD-10-CM | POA: Diagnosis not present

## 2018-02-13 DIAGNOSIS — Y999 Unspecified external cause status: Secondary | ICD-10-CM | POA: Insufficient documentation

## 2018-02-13 DIAGNOSIS — Z8546 Personal history of malignant neoplasm of prostate: Secondary | ICD-10-CM | POA: Diagnosis not present

## 2018-02-13 DIAGNOSIS — Y939 Activity, unspecified: Secondary | ICD-10-CM | POA: Insufficient documentation

## 2018-02-13 DIAGNOSIS — Z7901 Long term (current) use of anticoagulants: Secondary | ICD-10-CM | POA: Diagnosis not present

## 2018-02-13 DIAGNOSIS — E119 Type 2 diabetes mellitus without complications: Secondary | ICD-10-CM | POA: Diagnosis not present

## 2018-02-13 LAB — CBC WITH DIFFERENTIAL/PLATELET
Abs Immature Granulocytes: 0.1 10*3/uL — ABNORMAL HIGH (ref 0.00–0.07)
BASOS PCT: 1 %
Basophils Absolute: 0 10*3/uL (ref 0.0–0.1)
EOS ABS: 0.3 10*3/uL (ref 0.0–0.5)
EOS PCT: 8 %
HCT: 38.2 % — ABNORMAL LOW (ref 39.0–52.0)
Hemoglobin: 11.9 g/dL — ABNORMAL LOW (ref 13.0–17.0)
Immature Granulocytes: 3 %
Lymphocytes Relative: 21 %
Lymphs Abs: 0.8 10*3/uL (ref 0.7–4.0)
MCH: 28.8 pg (ref 26.0–34.0)
MCHC: 31.2 g/dL (ref 30.0–36.0)
MCV: 92.5 fL (ref 80.0–100.0)
Monocytes Absolute: 0.3 10*3/uL (ref 0.1–1.0)
Monocytes Relative: 9 %
Neutro Abs: 2.1 10*3/uL (ref 1.7–7.7)
Neutrophils Relative %: 58 %
Platelets: 134 10*3/uL — ABNORMAL LOW (ref 150–400)
RBC: 4.13 MIL/uL — ABNORMAL LOW (ref 4.22–5.81)
RDW: 14.4 % (ref 11.5–15.5)
WBC: 3.7 10*3/uL — ABNORMAL LOW (ref 4.0–10.5)
nRBC: 0 % (ref 0.0–0.2)

## 2018-02-13 LAB — URINALYSIS, ROUTINE W REFLEX MICROSCOPIC
BILIRUBIN URINE: NEGATIVE
Glucose, UA: NEGATIVE mg/dL
Hgb urine dipstick: NEGATIVE
Ketones, ur: NEGATIVE mg/dL
Leukocytes, UA: NEGATIVE
Nitrite: NEGATIVE
Protein, ur: NEGATIVE mg/dL
Specific Gravity, Urine: 1.016 (ref 1.005–1.030)
pH: 5 (ref 5.0–8.0)

## 2018-02-13 LAB — BASIC METABOLIC PANEL
Anion gap: 10 (ref 5–15)
BUN: 16 mg/dL (ref 8–23)
CO2: 24 mmol/L (ref 22–32)
Calcium: 8.9 mg/dL (ref 8.9–10.3)
Chloride: 107 mmol/L (ref 98–111)
Creatinine, Ser: 1.29 mg/dL — ABNORMAL HIGH (ref 0.61–1.24)
GFR calc Af Amer: 59 mL/min — ABNORMAL LOW (ref >60)
GFR calc non Af Amer: 51 mL/min — ABNORMAL LOW (ref >60)
GLUCOSE: 125 mg/dL — AB (ref 70–99)
Potassium: 3.9 mmol/L (ref 3.5–5.1)
Sodium: 141 mmol/L (ref 135–145)

## 2018-02-13 NOTE — ED Triage Notes (Signed)
Pt arrives via EMS from home with dizziness for the last several months when Dr. Arville Go some medication adjustments. Pt has a fib., fall yesterday on blood thinners. Denies pain from fall. Reports dizziness is worse since fall yesterday. Pt with lower extremity swelling. Pt alert, oriented x4. cbg 84.
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
## 2018-02-13 NOTE — ED Notes (Signed)
Patient verbalizes understanding of discharge instructions. Opportunity for questioning and answers were provided. Armband removed by staff, pt discharged from ED via wheelchair w/ wife 

## 2018-02-14 ENCOUNTER — Ambulatory Visit: Payer: Medicare Other | Admitting: Internal Medicine

## 2018-02-14 ENCOUNTER — Encounter: Payer: Self-pay | Admitting: Internal Medicine

## 2018-02-14 ENCOUNTER — Telehealth: Payer: Self-pay | Admitting: Internal Medicine

## 2018-02-14 VITALS — BP 126/68 | HR 68 | Temp 97.8°F | Resp 16 | Ht 69.0 in

## 2018-02-14 DIAGNOSIS — L109 Pemphigus, unspecified: Secondary | ICD-10-CM | POA: Diagnosis not present

## 2018-02-14 DIAGNOSIS — R269 Unspecified abnormalities of gait and mobility: Secondary | ICD-10-CM | POA: Diagnosis not present

## 2018-02-14 DIAGNOSIS — F419 Anxiety disorder, unspecified: Secondary | ICD-10-CM | POA: Diagnosis not present

## 2018-02-14 DIAGNOSIS — E1165 Type 2 diabetes mellitus with hyperglycemia: Secondary | ICD-10-CM

## 2018-02-14 MED ORDER — HYDROXYZINE HCL 10 MG PO TABS: 10.0000 mg | ORAL_TABLET | Freq: Three times a day (TID) | ORAL | 2 refills | Status: DC | PRN
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
## 2018-02-14 NOTE — Patient Instructions (Addendum)
GO TO THE FRONT DESK Schedule your next appointment.  I like to see you in 1 month from today  Take hydroxyzine as needed for anxiety  If your sugar goes below 120, 100: Start decreasing glimepiride, take 3 tablets instead of 4 and call me.  Always carry glucose with you, or orange juice.   Hypoglycemia Hypoglycemia is when the sugar (glucose) level in your blood is too low. Signs of low blood sugar may include:  Feeling: ? Hungry. ? Worried or nervous (anxious). ? Sweaty and clammy. ? Confused. ? Dizzy. ? Sleepy. ? Sick to your stomach (nauseous).  Having: ? A fast heartbeat. ? A headache. ? A change in your vision. ? Tingling or no feeling (numbness) around your mouth, lips, or tongue. ? Jerky movements that you cannot control (seizure).  Having trouble with: ? Moving (coordination). ? Sleeping. ? Passing out (fainting). ? Getting upset easily (irritability). Low blood sugar can happen to people who have diabetes and people who do not have diabetes. Low blood sugar can happen quickly, and it can be an emergency. Treating low blood sugar Low blood sugar is often treated by eating or drinking something sugary right away, such as:  Fruit juice, 4-6 oz (120-150 mL).  Regular soda (not diet soda), 4-6 oz (120-150 mL).  Low-fat milk, 4 oz (120 mL).  Several pieces of hard candy.  Sugar or honey, 1 Tbsp (15 mL). Treating low blood sugar if you have diabetes If you can think clearly and swallow safely, follow the 15:15 rule:  Take 15 grams of a fast-acting carb (carbohydrate). Talk with your doctor about how much you should take.  Always keep a source of fast-acting carb with you, such as: ? Sugar tablets (glucose pills). Take 3-4 pills. ? 6-8 pieces of hard candy. ? 4-6 oz (120-150 mL) of fruit juice. ? 4-6 oz (120-150 mL) of regular (not diet) soda. ? 1 Tbsp (15 mL) honey or sugar.  Check your blood sugar 15 minutes after you take the carb.  If your blood  sugar is still at or below 70 mg/dL (3.9 mmol/L), take 15 grams of a carb again.  If your blood sugar does not go above 70 mg/dL (3.9 mmol/L) after 3 tries, get help right away.  After your blood sugar goes back to normal, eat a meal or a snack within 1 hour.  Treating very low blood sugar If your blood sugar is at or below 54 mg/dL (3 mmol/L), you have very low blood sugar (severe hypoglycemia). This may also cause:  Passing out.  Jerky movements you cannot control (seizure).  Losing consciousness (coma). This is an emergency. Do not wait to see if the symptoms will go away. Get medical help right away. Call your local emergency services (911 in the [place] Do not drive yourself to the hospital. If you have very low blood sugar and you cannot eat or drink, you may need a glucagon shot (injection). A family member or friend should learn how to check your blood sugar and how to give you a glucagon shot. Ask your doctor if you need to have a glucagon shot kit at home. Follow these instructions at home: General instructions  Take over-the-counter and prescription medicines only as told by your doctor.  Stay aware of your blood sugar as told by your doctor.  Limit alcohol intake to no more than 1 drink a day for nonpregnant women and 2 drinks a day for men. One drink equals 12 oz  of beer (355 mL), 5 oz of wine (148 mL), or 1 oz of hard liquor (44 mL).  Keep all follow-up visits as told by your doctor. This is important. If you have diabetes:   Follow your diabetes care plan as told by your doctor. Make sure you: ? Know the signs of low blood sugar. ? Take your medicines as told. ? Follow your exercise and meal plan. ? Eat on time. Do not skip meals. ? Check your blood sugar as often as told by your doctor. Always check it before and after exercise. ? Follow your sick day plan when you cannot eat or drink normally. Make this plan ahead of time with your doctor.  Share your diabetes  care plan with: ? Your work or school. ? People you live with.  Check your pee (urine) for ketones: ? When you are sick. ? As told by your doctor.  Carry a card or wear jewelry that says you have diabetes. Contact a doctor if:  You have trouble keeping your blood sugar in your target range.  You have low blood sugar often. Get help right away if:  You still have symptoms after you eat or drink something sugary.  Your blood sugar is at or below 54 mg/dL (3 mmol/L).  You have jerky movements that you cannot control.  You pass out. These symptoms may be an emergency. Do not wait to see if the symptoms will go away. Get medical help right away. Call your local emergency services (911 in the [place] Do not drive yourself to the hospital. Summary  Hypoglycemia happens when the level of sugar (glucose) in your blood is too low.  Low blood sugar can happen to people who have diabetes and people who do not have diabetes. Low blood sugar can happen quickly, and it can be an emergency.  Make sure you know the signs of low blood sugar and know how to treat it.  Always keep a source of sugar (fast-acting carb) with you to treat low blood sugar. This information is not intended to replace advice given to you by your health care provider. Make sure you discuss any questions you have with your health care provider. Document Released: 04/04/2009 Document Revised: 07/02/2017 Document Reviewed: 02/11/2015 Elsevier Interactive Patient Education  2019 [employer].

## 2018-02-14 NOTE — Telephone Encounter (Signed)
Copied from [place] [id number]. Topic: Quick Communication - Home Health Verbal Orders >> Feb 14, 2018 11:27 AM Yvette Rack wrote: Caller/Agency: Almira with [facility]  Callback Number: [phone] Requesting OT/PT/Skilled Nursing/Social Work: PT Frequency: 2 times a week for 3 weeks and 1 time a week for 1 week   Almira with [facility] also reports that pt fell on Wednesday and went ED on Thursday.  Cb# [phone]

## 2018-02-14 NOTE — Progress Notes (Signed)
Pre visit review using our clinic review tool, if applicable. No additional management support is needed unless otherwise documented below in the visit note. 

## 2018-02-14 NOTE — Telephone Encounter (Signed)
Spoke w/ Constance Haw, informed that we see Pt this afternoon. Verbal orders given.

## 2018-02-15 NOTE — Assessment & Plan Note (Signed)
Frequent falls: Had a mechanical fall, went to the ER yesterday, work-up negative.  Fall happened at home while he was trying to walk with his walker.  No LOC.  He is already doing physical therapy at home. DM: Currently on glimepiride 2 mg 4 tablets daily, Actos and acarbose.  CBGs much decreased, now in the 160s at home.  Recommend to watch for low sugar closely, discussed low sugar symptoms, recommend to carry glucose with him.  Start decreasing glimepiride if the sugars are getting closer to 100. The patient and wife verbalized understanding Pemphigus: Much improved Anxiety: Wonders about a medication, previously Lexapro helped, I am somewhat reluctant to re- prescribe because he started Lexapro approximately June 2019 and shortly after he developed pemphigus.  Recommend Atarax and as needed, he agreed. Lower extremity edema: Slightly more noticeable today, reassess on RTC RTC 1 month

## 2018-02-24 ENCOUNTER — Telehealth: Payer: Self-pay

## 2018-02-24 ENCOUNTER — Ambulatory Visit: Payer: Medicare Other | Admitting: Internal Medicine

## 2018-02-24 NOTE — Telephone Encounter (Signed)
Plan of care signed and faxed to [facility] at [phone]. Form sent for scanning.

## 2018-03-12 NOTE — Progress Notes (Deleted)
Follow-up Visit   Date: 03/12/18    Dylan Orozco MRN: [id number] DOB: [birth year]   Interim History: Dylan Orozco is a 85 y.o. right-handed African American male with hypertension, diabetes mellitus type II, glaucoma with left eye blindness, history or prostate cancer, hyperlipidemia returning to the clinic for follow-up of idiopathic parkinson's disease.  The patient was accompanied to the clinic by self.   History of present illness: Starting since June 2015, patient has had 20lb unintentional weight loss which he initially attributed to metformin causing reduced appetite and weight loss. Since stopping this medication, he has gained 2lb. He also complains of frequent choking spells with solids, moreso than liquids. He has noticed that his voice has becomes slower and lower. His movements are slower. Denies problems with sense of smell, vivid dreams or constipation. He feels that he is stooped when walking. His hand writing has become much smaller and not as neat as it previously was. Denies any tremor.  Walking is much slower and he is shuffling his feet.   He also complains of mild memory problems and word finding difficulty. He is managing his own finances. He does not drive.   He underwent modified barium swallow which showed moderate pharyngreal and esophageal dysphagia. He was evaluated by Dr. Fuller Plan for the same complaints who agreed with neurology consultation recommended by his PCP for dysphagia.  He was started on sinemet in 2016 25/100 1 tab TID which improved generalized stiffness and walking.    UPDATE 11/01/2017:    He has not noticed marked benefit with adjusting the times of his sinemet to 9am, noon, and 5pm.  He continues to have severe shuffling gait.  He does not use a cane, as recommended.  Fortunately, he has not suffered falls.  He has stiffness of the arms and legs in the morning.  No new tremors, RLS symptoms, vivid dreams, or  lightheadedness.   UPDATE 03/14/2018: He is here for follow-up visit.  At his last visit, I increased Sinemet to 1.5 tablets 3 times daily. *** He as referred to physical therapy  He denies any new tremors, RLS symptoms, vivid dreams, lightheadedness, or constipation. ***  Medications:  Current Outpatient Medications on File Prior to Visit  Medication Sig Dispense Refill  . acarbose (PRECOSE) 25 MG tablet Take 1 tablet (25 mg total) by mouth 3 (three) times daily with meals. 90 tablet 6  . acetaminophen (TYLENOL) 325 MG tablet Take 2 tablets (650 mg total) by mouth every 6 (six) hours as needed for mild pain, fever or headache.    Marland Kitchen amiodarone (PACERONE) 200 MG tablet Take 1 tablet (200 mg total) by mouth 2 (two) times daily. 180 tablet 1  . apixaban (ELIQUIS) 5 MG TABS tablet Take 1 tablet (5 mg total) by mouth 2 (two) times daily. 180 tablet 1  . atorvastatin (LIPITOR) 20 MG tablet Take 1 tablet (20 mg total) by mouth daily. 90 tablet 1  . blood glucose meter kit and supplies Check blood sugars once daily. 1 each 0  . Blood Glucose Monitoring Suppl (ONETOUCH VERIO FLEX SYSTEM) w/Device KIT Check blood sugar once daily 1 kit 0  . carbidopa-levodopa (SINEMET IR) 25-100 MG tablet Take 1.5 tablets at 7am, noon, 5pm (Patient taking differently: Take 1.5 tablets by mouth 3 (three) times daily. 7am, noon, 5pm) 420 tablet 3  . cholecalciferol (VITAMIN D3) 25 MCG (1000 UT) tablet Take 1,000 Units by mouth daily.    . dorzolamide-timolol (COSOPT) 22.3-6.8  MG/ML ophthalmic solution Place 1 drop into both eyes 2 (two) times daily.  11  . feeding supplement (BOOST HIGH PROTEIN) LIQD Take 1 Container by mouth 2 (two) times a week.    Marland Kitchen glimepiride (AMARYL) 2 MG tablet Take 4 tablets (8 mg total) by mouth daily before breakfast. 120 tablet 3  . glucose blood (ONETOUCH VERIO) test strip Check blood sugar once daily 30 each 0  . hydrOXYzine (ATARAX/VISTARIL) 10 MG tablet Take 1 tablet (10 mg total) by mouth 3  (three) times daily as needed for anxiety. 30 tablet 2  . latanoprost (XALATAN) 0.005 % ophthalmic solution Place 1 drop into the right eye at bedtime.     Marland Kitchen losartan (COZAAR) 100 MG tablet Take 100 mg by mouth daily.    . Multiple Vitamin (MULTIVITAMIN WITH MINERALS) TABS Take 1 tablet by mouth daily.    . ONE TOUCH LANCETS MISC Check blood sugar once daily 30 each 0  . pantoprazole (PROTONIX) 40 MG tablet Take 1 tablet (40 mg total) by mouth daily at 12 noon. (Patient not taking: Reported on 02/13/2018) 30 tablet 0  . pioglitazone (ACTOS) 30 MG tablet Take 1 tablet (30 mg total) by mouth daily. 30 tablet 1   No current facility-administered medications on file prior to visit.     Allergies:  Allergies  Allergen Reactions  . Tradjenta [Linagliptin]     rash  . Pravastatin     Constipation  . Ramipril      dizziness    Review of Systems:  CONSTITUTIONAL: No fevers, chills, night sweats, or weight loss.  EYES: No visual changes or eye pain ENT: No hearing changes.  No history of nose bleeds.   RESPIRATORY: No cough, wheezing and shortness of breath.   CARDIOVASCULAR: Negative for chest pain, and palpitations.   GI: Negative for abdominal discomfort, blood in stools or black stools.  No recent change in bowel habits.   GU:  No history of incontinence.   MUSCLOSKELETAL: No history of joint pain or swelling.  No myalgias.   SKIN: Negative for lesions, rash, and itching.   ENDOCRINE: Negative for cold or heat intolerance, polydipsia or goiter.   PSYCH:  No depression or anxiety symptoms.   NEURO: As Above.   Vital Signs:  There were no vitals taken for this visit.  General Medical Exam:   General:  Well appearing, comfortable  Eyes/ENT: see cranial nerve examination.   Neck:   No carotid bruits. Respiratory:  Clear to auscultation, good air entry bilaterally.   Cardiac:  Irregular rate and rhythm, no murmur.   Ext;  No edema  Neurological Exam: MENTAL STATUS:  Awake,  severely blunted affect, smiles appropriately.  Oriented to person, place, and time.  Speech mildly hypophonic and slow (stable), no dysarthria.  CRANIAL NERVES:  Left eye is blind with mild ptosis.  Poor blink.  Pupils equal round and reactive to light.  Normal conjugate, extra-ocular eye movements in all directions of gaze.  Face is symmetric.    MOTOR:  Motor strength is 5/5 in all extremities.  There is mild rigidity of the upper extremities.   ***  COORDINATION/GAIT: Finger and heel tapping shows severely reduced amplitude, normal rate.  Gait shows mildly stooped posture with severe shuffling and somewhat unsteady, gait is tested unassisted  Data: MRI brain wo contrast 08/28/2014:  No acute intracranial findings. Age-related atrophy with mild to moderate small vessel disease.  Labs 08/25/2014:  Vitamin B12 1435, copper 110, zinc 68  IMPRESSION/PLAN: 1.  Akinetic rigid parkinson disease (LUE rigidity, bradykinesia, hypophonia, shuffling gait).  Exam shows slight improvement in rigidity, however  gait remains feninstating. *** sinemet to 25/143m 1.5 tablet 9am, noon, and 5pm.   *** physical therapy for gait training Recommend using a cane or walker for support.  He does not drive due to left eye blindness. Encouraged to try to remain active  2.  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  Stable.   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  If I can answer any additional questions, I would be pleased to do so.    Sincerely,    Griffin Gerrard K. PPosey Pronto DO

## 2018-03-13 ENCOUNTER — Ambulatory Visit: Payer: Medicare Other | Admitting: Internal Medicine

## 2018-03-14 ENCOUNTER — Ambulatory Visit (HOSPITAL_BASED_OUTPATIENT_CLINIC_OR_DEPARTMENT_OTHER)
Admission: RE | Admit: 2018-03-14 | Discharge: 2018-03-14 | Disposition: A | Discharge status: Home / Self Care | Payer: Medicare Other | Source: Ambulatory Visit | Attending: Internal Medicine | Admitting: Internal Medicine

## 2018-03-14 ENCOUNTER — Encounter: Payer: Self-pay | Admitting: Internal Medicine

## 2018-03-14 ENCOUNTER — Ambulatory Visit: Payer: Medicare Other | Admitting: Internal Medicine

## 2018-03-14 ENCOUNTER — Ambulatory Visit: Payer: Medicare Other | Admitting: Neurology

## 2018-03-14 VITALS — BP 108/62 | HR 74 | Temp 98.1°F | Resp 18 | Ht 69.0 in | Wt 140.4 lb

## 2018-03-14 DIAGNOSIS — E782 Mixed hyperlipidemia: Secondary | ICD-10-CM

## 2018-03-14 DIAGNOSIS — F419 Anxiety disorder, unspecified: Secondary | ICD-10-CM

## 2018-03-14 DIAGNOSIS — E1142 Type 2 diabetes mellitus with diabetic polyneuropathy: Secondary | ICD-10-CM | POA: Diagnosis not present

## 2018-03-14 DIAGNOSIS — R600 Localized edema: Secondary | ICD-10-CM

## 2018-03-14 DIAGNOSIS — I10 Essential (primary) hypertension: Secondary | ICD-10-CM | POA: Diagnosis not present

## 2018-03-14 LAB — LIPID PANEL
CHOL/HDL RATIO: 2
Cholesterol: 155 mg/dL (ref 0–200)
HDL: 64.6 mg/dL (ref >39.00)
LDL Cholesterol: 82 mg/dL (ref 0–99)
NonHDL: 90.28
Triglycerides: 41 mg/dL (ref 0.0–149.0)
VLDL: 8.2 mg/dL (ref 0.0–40.0)

## 2018-03-14 LAB — BASIC METABOLIC PANEL
BUN: 27 mg/dL — AB (ref 6–23)
CO2: 30 mEq/L (ref 19–32)
Calcium: 8.9 mg/dL (ref 8.4–10.5)
Chloride: 110 mEq/L (ref 96–112)
Creatinine, Ser: 1.16 mg/dL (ref 0.40–1.50)
GFR: 72.77 mL/min (ref >60.00)
GLUCOSE: 165 mg/dL — AB (ref 70–99)
Potassium: 3.6 mEq/L (ref 3.5–5.1)
Sodium: 147 mEq/L — ABNORMAL HIGH (ref 135–145)

## 2018-03-14 MED ORDER — FUROSEMIDE 40 MG PO TABS: 20.0000 mg | ORAL_TABLET | Freq: Every day | ORAL | 1 refills | Status: DC

## 2018-03-14 NOTE — Progress Notes (Signed)
Pre visit review using our clinic review tool, if applicable. No additional management support is needed unless otherwise documented below in the visit note. 

## 2018-03-14 NOTE — Patient Instructions (Signed)
GO TO THE LAB : Get the blood work     GO TO THE FRONT DESK Schedule your next appointment   for a checkup in 4 weeks     STOP BY THE FIRST FLOOR:  get the XR    Decrease furosemide to half tablet daily  Continue with low-salt diet  Leg elevation at least 1 hour 3 times a day   Check the  blood pressure 2 or 3 times a week Be sure your blood pressure is between 110/65 and  135/85. If it is consistently higher or lower, let me know
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
## 2018-03-16 NOTE — Assessment & Plan Note (Signed)
Frequent falls: No further problems DM: Good compliance with glimepiride, acarbose, Actos.  CBGs in the 160s.  No change. Neuropathy: See physical exam, likely from DM, feet care carefully discussed.  [facility] High cholesterol, continue Lipitor, check a FLP L E edema: Problem started approximately 2 weeks ago, edema is worse at the end of the day. A week ago he went to urgent care was prescribed Lasix 40 mg, it is helping some. Echo 12/14/2017: EF 60%, unable to evaluate LV diastolic function.  He takes Actos. Weight has not increased.  Dependent edema?  Side effect Actos?  Normal EF heart failure?. Plan: Chest x-ray to assess volume status. Decrease Lasix to 20 mg, check BMP, leg elevation, continue low-salt diet.  Pt to call in 2 weeks and let me know how he is doing.  Repeat echo?  DC Actos?Marland Kitchen Anxiety: Controlled on Atarax RTC 4 weeks

## 2018-03-17 ENCOUNTER — Telehealth: Payer: Self-pay | Admitting: *Deleted

## 2018-03-17 NOTE — Telephone Encounter (Signed)
Received Home Health Discharge-Transfer Summary for review from [facility]; forwarded to provider/SLS 02/24

## 2018-03-30 ENCOUNTER — Other Ambulatory Visit: Payer: Self-pay | Admitting: Internal Medicine

## 2018-03-31 ENCOUNTER — Other Ambulatory Visit: Payer: Self-pay | Admitting: Internal Medicine

## 2018-03-31 MED ORDER — TELMISARTAN 80 MG PO TABS: 80.0000 mg | ORAL_TABLET | Freq: Every day | ORAL | 1 refills | Status: DC

## 2018-03-31 NOTE — Telephone Encounter (Signed)
Recommend telmisartan 80 mg 1 tablet daily.  Send a new prescription.  Advised patient about the change, needs to monitor BPs to be sure he is tolerating the new medication.

## 2018-03-31 NOTE — Telephone Encounter (Signed)
Telmisartan 80mg  sent to pharmacy.

## 2018-03-31 NOTE — Telephone Encounter (Signed)
All losartan on back order. Please advise.

## 2018-04-08 ENCOUNTER — Other Ambulatory Visit: Payer: Self-pay | Admitting: Internal Medicine

## 2018-04-10 ENCOUNTER — Telehealth: Payer: Self-pay | Admitting: Internal Medicine

## 2018-04-10 NOTE — Telephone Encounter (Signed)
Advise patient, in light of the  coronavirus outbreak, okay to postpone the visit for tomorrow for April. Ask about his weight, lower extremity swelling: Is he doing okay?

## 2018-04-10 NOTE — Telephone Encounter (Addendum)
Spoke w/ Deborra Medina Pt's wife, Pt is doing well overall, weights are stable, his legs are still swelling but is keeping them elevated when sitting, he did get his diabetic shoes and Deborra Medina is worried that his walking/gait is declining. We decided to cancel appt for tomorrow and schedule on 4/14.

## 2018-04-11 ENCOUNTER — Ambulatory Visit: Payer: Medicare Other | Admitting: Internal Medicine

## 2018-04-16 ENCOUNTER — Other Ambulatory Visit: Payer: Self-pay | Admitting: Internal Medicine

## 2018-04-18 ENCOUNTER — Other Ambulatory Visit: Payer: Self-pay | Admitting: Internal Medicine

## 2018-04-19 ENCOUNTER — Other Ambulatory Visit: Payer: Self-pay | Admitting: Neurology

## 2018-05-06 ENCOUNTER — Other Ambulatory Visit: Payer: Self-pay

## 2018-05-06 ENCOUNTER — Ambulatory Visit (INDEPENDENT_AMBULATORY_CARE_PROVIDER_SITE_OTHER): Payer: Medicare Other | Admitting: Internal Medicine

## 2018-05-06 DIAGNOSIS — R600 Localized edema: Secondary | ICD-10-CM

## 2018-05-06 DIAGNOSIS — E1142 Type 2 diabetes mellitus with diabetic polyneuropathy: Secondary | ICD-10-CM
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
## 2018-05-07 ENCOUNTER — Telehealth: Payer: Self-pay

## 2018-05-07 NOTE — Assessment & Plan Note (Signed)
Lower extremity edema: Since the last visit, the chest x-ray showed no CHF and BMP was satisfactory. Swelling is about the same.  Patient has no scale at home to follow-up his weight. Plan: Continue present care except stop Actos as it is possibly the culprit for lower extremity edema, this was extensively discussed with the patient and his wife.  Again encouraged leg elevation. DM: d/c actos, see above Reassess by a phone visit in 2 weeks.  Will arrange.

## 2018-05-07 NOTE — Telephone Encounter (Signed)
[place], Pt's spouse to call and schedule telephone visit to be done in 2 weeks.

## 2018-05-09 NOTE — Telephone Encounter (Signed)
Virtual visit scheduled 05/26/2018.

## 2018-05-12 ENCOUNTER — Ambulatory Visit: Payer: Medicare Other | Admitting: Neurology

## 2018-05-26 ENCOUNTER — Ambulatory Visit (INDEPENDENT_AMBULATORY_CARE_PROVIDER_SITE_OTHER): Payer: Medicare Other | Admitting: Internal Medicine

## 2018-05-26 ENCOUNTER — Other Ambulatory Visit: Payer: Self-pay

## 2018-05-26 DIAGNOSIS — E1142 Type 2 diabetes mellitus with diabetic polyneuropathy: Secondary | ICD-10-CM | POA: Diagnosis not present

## 2018-05-26 DIAGNOSIS — L109 Pemphigus, unspecified: Secondary | ICD-10-CM | POA: Diagnosis not present

## 2018-05-26 DIAGNOSIS — R600 Localized edema: Secondary | ICD-10-CM | POA: Diagnosis not present

## 2018-05-26 DIAGNOSIS — I10 Essential (primary) hypertension: Secondary | ICD-10-CM
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
## 2018-05-27 NOTE — Assessment & Plan Note (Signed)
Lower extremity edema: See last visit, Actos was d/c, patient reports edema is significantly better.  No weight checked at home. DM: Currently on acarbose and  glimepiride 3 tablets daily; unable to check CBGs, his machine is not working, states he will try to fix it. Dizziness: Chronic, at baseline HTN: Currently on furosemide and Micardis, no ambulatory CBGs. Dysphagia, h/o: Currently not an issue, not taking PPIs Pemphigus: Not an issue at this point Under ideal circumstances I will check a A1c, TFTs and a BMP but due to coronavirus,  both patient and myself  are reluctant to bring him to the office. RTC 2 months face-to-face or virtual.

## 2018-07-02 ENCOUNTER — Other Ambulatory Visit: Payer: Self-pay

## 2018-07-02 ENCOUNTER — Other Ambulatory Visit: Payer: Self-pay | Admitting: Internal Medicine

## 2018-07-02 ENCOUNTER — Encounter: Payer: Self-pay | Admitting: Neurology

## 2018-07-02 ENCOUNTER — Telehealth: Payer: Medicare Other | Admitting: Neurology

## 2018-07-03 ENCOUNTER — Other Ambulatory Visit: Payer: Self-pay | Admitting: Internal Medicine

## 2018-07-03 NOTE — Progress Notes (Signed)
Patient cancelled visit

## 2018-07-31 ENCOUNTER — Other Ambulatory Visit: Payer: Self-pay

## 2018-07-31 ENCOUNTER — Ambulatory Visit: Payer: Medicare Other | Admitting: Internal Medicine

## 2018-07-31 ENCOUNTER — Other Ambulatory Visit: Payer: Self-pay | Admitting: Internal Medicine

## 2018-07-31 ENCOUNTER — Encounter: Payer: Self-pay | Admitting: Internal Medicine

## 2018-07-31 VITALS — BP 141/69 | HR 64 | Temp 98.4°F | Resp 18 | Ht 69.0 in | Wt 138.1 lb

## 2018-07-31 DIAGNOSIS — I10 Essential (primary) hypertension: Secondary | ICD-10-CM | POA: Diagnosis not present

## 2018-07-31 DIAGNOSIS — R7989 Other specified abnormal findings of blood chemistry: Secondary | ICD-10-CM

## 2018-07-31 DIAGNOSIS — G20 Parkinson's disease: Secondary | ICD-10-CM

## 2018-07-31 DIAGNOSIS — E119 Type 2 diabetes mellitus without complications: Secondary | ICD-10-CM | POA: Diagnosis not present

## 2018-07-31 DIAGNOSIS — R946 Abnormal results of thyroid function studies: Secondary | ICD-10-CM | POA: Diagnosis not present

## 2018-07-31 DIAGNOSIS — F419 Anxiety disorder, unspecified: Secondary | ICD-10-CM

## 2018-07-31 LAB — TSH: TSH: 0.46 u[IU]/mL (ref 0.35–4.50)

## 2018-07-31 LAB — HEMOGLOBIN A1C: Hgb A1c MFr Bld: 7.7 % — ABNORMAL HIGH (ref 4.6–6.5)

## 2018-07-31 MED ORDER — HYDROXYZINE HCL 10 MG PO TABS: 10.0000 mg | ORAL_TABLET | Freq: Three times a day (TID) | ORAL | 3 refills | Status: DC | PRN

## 2018-07-31 NOTE — Patient Instructions (Addendum)
Get the blood work     [place] Schedule your next appointment   for a checkup in 3 months   We are referring you to see your neurologist  For anxiety take Hydroxyzine as needed.  We just sent a prescription

## 2018-07-31 NOTE — Progress Notes (Signed)
Pre visit review using our clinic review tool, if applicable. No additional management support is needed unless otherwise documented below in the visit note. 
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
## 2018-08-01 NOTE — Assessment & Plan Note (Signed)
DM: Continue present care, checking A1c, likes to freestyle glucometer, will send a prescription (eventually declined need to get a prescription due to cost). HTN: Last BMP satisfactory, continue Micardis Lower extremity edema: Resolved after we stopped Actos, he took Lasix temporarily but not anymore. Parkinson: Missed his last neurology appointment, will refer him. Anxiety: Refill Atarax; for some reason he has not been using it as needed, apparently he did not recognize that this medication was prescribed for anxiety.  He now verbalized understanding. Slightly suppressed TSH: Recheck a TSH RTC 3 months

## 2018-08-04 MED ORDER — ACARBOSE 50 MG PO TABS: 50.0000 mg | ORAL_TABLET | Freq: Three times a day (TID) | ORAL | 1 refills | Status: DC

## 2018-08-04 NOTE — Addendum Note (Signed)
Addended byDamita Dunnings D on: 08/04/2018 08:13 AM   Modules accepted: Orders

## 2018-08-20 ENCOUNTER — Telehealth: Payer: Medicare Other | Admitting: Neurology

## 2018-08-20 ENCOUNTER — Encounter: Payer: Self-pay | Admitting: Neurology

## 2018-08-22 ENCOUNTER — Telehealth (INDEPENDENT_AMBULATORY_CARE_PROVIDER_SITE_OTHER): Payer: Medicare Other | Admitting: Neurology

## 2018-08-22 ENCOUNTER — Other Ambulatory Visit: Payer: Self-pay

## 2018-08-22 VITALS — Ht 69.0 in | Wt 138.0 lb

## 2018-08-22 DIAGNOSIS — G20 Parkinson's disease: Secondary | ICD-10-CM | POA: Diagnosis not present

## 2018-08-22 DIAGNOSIS — F0280 Dementia in other diseases classified elsewhere without behavioral disturbance: Secondary | ICD-10-CM | POA: Diagnosis not present

## 2018-08-22 DIAGNOSIS — Z7189 Other specified counseling: Secondary | ICD-10-CM | POA: Diagnosis not present

## 2018-08-22 DIAGNOSIS — G3183 Dementia with Lewy bodies: Secondary | ICD-10-CM

## 2018-08-22 MED ORDER — CARBIDOPA-LEVODOPA 25-100 MG PO TABS: 1.5000 | ORAL_TABLET | Freq: Three times a day (TID) | ORAL | 3 refills | Status: DC
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
## 2018-08-23 ENCOUNTER — Other Ambulatory Visit: Payer: Self-pay | Admitting: Internal Medicine

## 2018-09-26 ENCOUNTER — Other Ambulatory Visit: Payer: Self-pay | Admitting: Internal Medicine

## 2018-10-06 LAB — HM DIABETES EYE EXAM

## 2018-10-09 ENCOUNTER — Other Ambulatory Visit: Payer: Self-pay | Admitting: Internal Medicine

## 2018-10-24 ENCOUNTER — Encounter: Payer: Self-pay | Admitting: Internal Medicine

## 2018-10-30 ENCOUNTER — Ambulatory Visit: Payer: Medicare Other | Admitting: Cardiology

## 2018-10-30 ENCOUNTER — Telehealth: Payer: Self-pay | Admitting: Internal Medicine

## 2018-10-30 ENCOUNTER — Encounter: Payer: Self-pay | Admitting: Internal Medicine

## 2018-10-30 ENCOUNTER — Other Ambulatory Visit: Payer: Self-pay

## 2018-10-30 ENCOUNTER — Ambulatory Visit (INDEPENDENT_AMBULATORY_CARE_PROVIDER_SITE_OTHER): Payer: Medicare Other | Admitting: Internal Medicine

## 2018-10-30 DIAGNOSIS — G20 Parkinson's disease: Secondary | ICD-10-CM | POA: Diagnosis not present

## 2018-10-30 DIAGNOSIS — E119 Type 2 diabetes mellitus without complications: Secondary | ICD-10-CM

## 2018-10-30 DIAGNOSIS — I4891 Unspecified atrial fibrillation: Secondary | ICD-10-CM

## 2018-10-30 DIAGNOSIS — I10 Essential (primary) hypertension: Secondary | ICD-10-CM | POA: Diagnosis not present

## 2018-10-30 DIAGNOSIS — F419 Anxiety disorder, unspecified: Secondary | ICD-10-CM

## 2018-10-30 MED ORDER — ACARBOSE 50 MG PO TABS: 50.0000 mg | ORAL_TABLET | Freq: Three times a day (TID) | ORAL | 1 refills | Status: DC

## 2018-10-30 NOTE — Telephone Encounter (Signed)
Patient's daughter called in stating she would clarification on a few medications. Please advise. Call back [phone].
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
## 2018-10-30 NOTE — Telephone Encounter (Signed)
Please advise 

## 2018-10-30 NOTE — Telephone Encounter (Signed)
Patient requesting a call back from CMA to discuss if his medication amiodarone (PACERONE) 200 MG tablet will change after appointment today.

## 2018-10-30 NOTE — Telephone Encounter (Signed)
Called  see documentation on today's visit note

## 2018-10-31 ENCOUNTER — Ambulatory Visit (INDEPENDENT_AMBULATORY_CARE_PROVIDER_SITE_OTHER): Payer: Medicare Other | Admitting: Cardiology

## 2018-10-31 ENCOUNTER — Encounter: Payer: Self-pay | Admitting: Cardiology

## 2018-10-31 ENCOUNTER — Other Ambulatory Visit: Payer: Self-pay

## 2018-10-31 VITALS — BP 134/74 | HR 66 | Ht 69.0 in | Wt 144.0 lb

## 2018-10-31 DIAGNOSIS — I4891 Unspecified atrial fibrillation: Secondary | ICD-10-CM | POA: Diagnosis not present

## 2018-10-31 NOTE — Patient Instructions (Addendum)
Medication Instructions:  Your physician recommends that you continue on your current medications as directed. Please refer to the Current Medication list given to you today.  If you need a refill on your cardiac medications before your next appointment, please call your pharmacy.   Lab work: Your physician recommends that you return for lab work 2 days before next visit: BMP, MG, TSH   If you have labs (blood work) drawn today and your tests are completely normal, you will receive your results only by: Marland Kitchen MyChart Message (if you have MyChart) OR . A paper copy in the mail If you have any lab test that is abnormal or we need to change your treatment, we will call you to review the results.  Testing/Procedures: Your physician has recommended that you have a pulmonary function test. Pulmonary Function Tests are a group of tests that measure how well air moves in and out of your lungs.    Follow-Up: . Follow up in 3 months   Any Other Special Instructions Will Be Listed Below (If Applicable).   Pulmonary Function Tests Pulmonary function tests (PFTs) are used to measure how well your lungs work, find out what is causing your lung problems, and figure out the best treatment for you. You may have PFTs:  When you have an illness involving the lungs.  To follow changes in your lung function over time if you have a chronic lung disease.  If you are an Nature conservation officer. This checks the effects of being exposed to chemicals over a long period of time.  To check lung function before having surgery or other procedures.  To check your lungs if you smoke.  To check if prescribed medicines or treatments are helping your lungs. Your results will be compared to the expected lung function of someone with healthy lungs who is similar to you in:  Age.  Gender.  Height.  Weight.  Race or ethnicity. This is done to show how your lungs compare to normal lung function (percent  predicted). This is how your health care provider knows if your lung function is normal or not. If you have had PFTs done before, your health care provider will compare your current results with past results. This shows if your lung function is better, worse, or the same as before. Tell a health care provider about:  Any allergies you have.  All medicines you are taking, including inhaler or nebulizer medicines, vitamins, herbs, eye drops, creams, and over-the-counter medicines.  Any blood disorders you have.  Any surgeries you have had, especially recent eye surgery, abdominal surgery, or chest surgery. These can make PFTs difficult or unsafe.  Any medical conditions you have, including chest pain or heart problems, tuberculosis, or respiratory infections such as pneumonia, a cold, or the flu.  Any fear of being in closed spaces (claustrophobia). Some of your tests may be in a closed space. What are the risks? Generally, this is a safe procedure. However, problems may occur, including:  Light-headedness due to over-breathing (hyperventilation).  An asthma attack from deep breathing.  A collapsed lung. What happens before the procedure?  Take over-the-counter and prescription medicines only as told by your health care provider. If you take inhaler or nebulizer medicines, ask your health care provider which medicines you should take on the day of your testing. Some inhaler medicines may interfere with PFTs if they are taken shortly before the tests.  Follow your health care provider's instructions on eating and drinking restrictions.  This may include avoiding eating large meals and drinking alcohol before the testing.  Do not use any products that contain nicotine or tobacco, such as cigarettes and e-cigarettes. If you need help quitting, ask your health care provider.  Wear comfortable clothing that will not interfere with breathing. What happens during the procedure?   You will be  given a soft nose clip to wear. This is done so all of your breaths will go through your mouth instead of your nose.  You will be given a germ-free (sterile) mouthpiece. It will be attached to a machine that measures your breathing (spirometer).  You will be asked to do various breathing maneuvers. The maneuvers will be done by breathing in (inhaling) and breathing out (exhaling). You may be asked to repeat the maneuvers several times before the testing is done.  It is important to follow the instructions exactly to get accurate results. Make sure to blow as hard and as fast as you can when you are told to do so.  You may be given a medicine that makes the small air passages in your lungs larger (bronchodilator) after testing has been done. This medicine will make it easier for you to breathe.  The tests will be repeated after the bronchodilator has taken effect.  You will be monitored carefully during the procedure for faintness, dizziness, trouble breathing, or any other problems. The procedure may vary among health care providers and hospitals. What happens after the procedure?  It is up to you to get your test results. Ask your health care provider, or the department that is doing the tests, when your results will be ready. After you have received your test results, talk with your health care provider about treatment options, if necessary. Summary  Pulmonary function tests (PFTs) are used to measure how well your lungs work, find out what is causing your lung problems, and figure out the best treatment for you.  Wear comfortable clothing that will not interfere with breathing.  It is up to you to get your test results. After you have received them, talk with your health care provider about treatment options, if necessary. This information is not intended to replace advice given to you by your health care provider. Make sure you discuss any questions you have with your health care  provider. Document Released: 09/01/2003 Document Revised: 01/06/2016 Document Reviewed: 12/01/2015 Elsevier Patient Education  2020 [employer].
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
## 2018-11-01 ENCOUNTER — Encounter: Payer: Self-pay | Admitting: Cardiology

## 2018-11-01 NOTE — Assessment & Plan Note (Signed)
DM: Last A1c improved, currently on glimepiride 6 mg daily and acarbose 50 mg 3 times a day. HTN: Currently not taking Micardis, no ambulatory BPs. Hyperlipidemia: On Lipitor Parkinsonian: On Sinemet. Chronic dizziness: Recommend observation for now Atrial fibrillation:  Saw cards  12/05/2017, at the time he was not taking amiodarone. Admitted 12/13/2017  new onset of atrial fibrillation, started Eliquis and amiodarone. Good compliance but needs in person follow-up to monitor toxicity.  Will need TFTs, LFTs, EKG, PFTs if patient feels he could pursue.  He already has an ophthalmologist. Will arrange a cardiology referral as well. Anxiety: On Atarax as needed Preventive care: Got a flu shot yesterday. RTC 3 months.

## 2018-11-04 ENCOUNTER — Ambulatory Visit: Payer: Medicare Other | Admitting: Internal Medicine

## 2018-11-04 DIAGNOSIS — Z0289 Encounter for other administrative examinations: Secondary | ICD-10-CM

## 2018-11-06 ENCOUNTER — Ambulatory Visit: Payer: Self-pay

## 2018-11-06 ENCOUNTER — Emergency Department (HOSPITAL_COMMUNITY): Payer: Medicare Other

## 2018-11-06 ENCOUNTER — Other Ambulatory Visit: Payer: Self-pay

## 2018-11-06 ENCOUNTER — Encounter (HOSPITAL_COMMUNITY): Payer: Self-pay | Admitting: Emergency Medicine

## 2018-11-06 ENCOUNTER — Emergency Department (HOSPITAL_COMMUNITY)
Admission: EM | Admit: 2018-11-06 | Discharge: 2018-11-06 | Disposition: A | Discharge status: Home / Self Care | Payer: Medicare Other | Attending: Emergency Medicine | Admitting: Emergency Medicine

## 2018-11-06 DIAGNOSIS — R531 Weakness: Secondary | ICD-10-CM

## 2018-11-06 DIAGNOSIS — Y92012 Bathroom of single-family (private) house as the place of occurrence of the external cause: Secondary | ICD-10-CM | POA: Insufficient documentation

## 2018-11-06 DIAGNOSIS — I4891 Unspecified atrial fibrillation: Secondary | ICD-10-CM | POA: Insufficient documentation

## 2018-11-06 DIAGNOSIS — R296 Repeated falls: Secondary | ICD-10-CM | POA: Diagnosis present

## 2018-11-06 DIAGNOSIS — Z79899 Other long term (current) drug therapy: Secondary | ICD-10-CM | POA: Insufficient documentation

## 2018-11-06 DIAGNOSIS — Z7901 Long term (current) use of anticoagulants: Secondary | ICD-10-CM | POA: Insufficient documentation

## 2018-11-06 DIAGNOSIS — Z87891 Personal history of nicotine dependence: Secondary | ICD-10-CM | POA: Insufficient documentation

## 2018-11-06 DIAGNOSIS — W01198A Fall on same level from slipping, tripping and stumbling with subsequent striking against other object, initial encounter: Secondary | ICD-10-CM | POA: Insufficient documentation

## 2018-11-06 DIAGNOSIS — I10 Essential (primary) hypertension: Secondary | ICD-10-CM | POA: Insufficient documentation

## 2018-11-06 DIAGNOSIS — R410 Disorientation, unspecified: Secondary | ICD-10-CM | POA: Insufficient documentation

## 2018-11-06 DIAGNOSIS — G20 Parkinson's disease: Secondary | ICD-10-CM | POA: Diagnosis not present

## 2018-11-06 DIAGNOSIS — W19XXXA Unspecified fall, initial encounter: Secondary | ICD-10-CM

## 2018-11-06 DIAGNOSIS — Z8546 Personal history of malignant neoplasm of prostate: Secondary | ICD-10-CM | POA: Diagnosis not present

## 2018-11-06 LAB — URINALYSIS, ROUTINE W REFLEX MICROSCOPIC
Bacteria, UA: NONE SEEN
Bilirubin Urine: NEGATIVE
Glucose, UA: 50 mg/dL — AB
Hgb urine dipstick: NEGATIVE
Ketones, ur: 5 mg/dL — AB
Leukocytes,Ua: NEGATIVE
Nitrite: NEGATIVE
Protein, ur: 30 mg/dL — AB
Specific Gravity, Urine: 1.023 (ref 1.005–1.030)
pH: 6 (ref 5.0–8.0)

## 2018-11-06 LAB — COMPREHENSIVE METABOLIC PANEL
ALT: 12 U/L (ref 0–44)
AST: 39 U/L (ref 15–41)
Albumin: 3.4 g/dL — ABNORMAL LOW (ref 3.5–5.0)
Alkaline Phosphatase: 132 U/L — ABNORMAL HIGH (ref 38–126)
Anion gap: 11 (ref 5–15)
BUN: 17 mg/dL (ref 8–23)
CO2: 22 mmol/L (ref 22–32)
Calcium: 9.1 mg/dL (ref 8.9–10.3)
Chloride: 108 mmol/L (ref 98–111)
Creatinine, Ser: 1.24 mg/dL (ref 0.61–1.24)
GFR calc Af Amer: >60 mL/min (ref >60)
GFR calc non Af Amer: 53 mL/min — ABNORMAL LOW (ref >60)
Glucose, Bld: 152 mg/dL — ABNORMAL HIGH (ref 70–99)
Potassium: 3.8 mmol/L (ref 3.5–5.1)
Sodium: 141 mmol/L (ref 135–145)
Total Bilirubin: 0.8 mg/dL (ref 0.3–1.2)
Total Protein: 6.6 g/dL (ref 6.5–8.1)

## 2018-11-06 LAB — CBC WITH DIFFERENTIAL/PLATELET
Abs Immature Granulocytes: 0.02 10*3/uL (ref 0.00–0.07)
Basophils Absolute: 0 10*3/uL (ref 0.0–0.1)
Basophils Relative: 1 %
Eosinophils Absolute: 0.2 10*3/uL (ref 0.0–0.5)
Eosinophils Relative: 4 %
HCT: 40.3 % (ref 39.0–52.0)
Hemoglobin: 12.4 g/dL — ABNORMAL LOW (ref 13.0–17.0)
Immature Granulocytes: 1 %
Lymphocytes Relative: 18 %
Lymphs Abs: 0.7 10*3/uL (ref 0.7–4.0)
MCH: 28.7 pg (ref 26.0–34.0)
MCHC: 30.8 g/dL (ref 30.0–36.0)
MCV: 93.3 fL (ref 80.0–100.0)
Monocytes Absolute: 0.3 10*3/uL (ref 0.1–1.0)
Monocytes Relative: 8 %
Neutro Abs: 2.8 10*3/uL (ref 1.7–7.7)
Neutrophils Relative %: 68 %
Platelets: 108 10*3/uL — ABNORMAL LOW (ref 150–400)
RBC: 4.32 MIL/uL (ref 4.22–5.81)
RDW: 14.4 % (ref 11.5–15.5)
WBC: 4 10*3/uL (ref 4.0–10.5)
nRBC: 0 % (ref 0.0–0.2)

## 2018-11-06 LAB — TROPONIN I (HIGH SENSITIVITY)
Troponin I (High Sensitivity): 7 ng/L (ref <18)
Troponin I (High Sensitivity): 13 ng/L (ref <18)
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
## 2018-11-06 NOTE — Telephone Encounter (Signed)
FYI

## 2018-11-06 NOTE — Telephone Encounter (Signed)
Pt.'s wife called to report pt. Started acting confused and having hallucinations yesterday. Seeing "people that aren't there." States he knows who family members are. Does not drink alcohol or use drugs. No new medications. Instructed wife to take pt. To ED, verbalizes understanding.  Reason for Disposition . Very strange or paranoid behavior  Answer Assessment - Initial Assessment Questions 1. LEVEL OF CONSCIOUSNESS: "How is he (she, the patient) acting right now?" (e.g., alert-oriented, confused, lethargic, stuporous, comatose)     Awake, knows family 2. ONSET: "When did the confusion start?"  (minutes, hours, days)     Yesterday 3. PATTERN "Does this come and go, or has it been constant since it started?"  "Is it present now?"     Present now 4. ALCOHOL or DRUGS: "Has he been drinking alcohol or taking any drugs?"      No 5. NARCOTIC MEDICATIONS: "Has he been receiving any narcotic medications?" (e.g., morphine, Vicodin)     No 6. CAUSE: "What do you think is causing the confusion?"      Unsure 7. OTHER SYMPTOMS: "Are there any other symptoms?" (e.g., difficulty breathing, headache, fever, weakness)     No  Protocols used: [place]

## 2018-11-06 NOTE — Telephone Encounter (Signed)
Ok,noted

## 2018-11-06 NOTE — ED Notes (Signed)
Patient verbalizes understanding of discharge instructions. Opportunity for questioning and answers were provided. Armband removed by staff, pt discharged from ED with daughter. Pt left in wheelchair.

## 2018-11-06 NOTE — ED Notes (Signed)
patients daughter stepped out for a few minutes, but will be back.

## 2018-11-06 NOTE — ED Triage Notes (Signed)
Pt arrives via EMS for a fall yesterday at 4pm. Pt slipped, hit head on floor/tub. Pt has fallen twice today as well per pt's family. Pt lethargic and weak. Pt leaning to the left, shuffling feet since he fell yesterday. BP 125/73, HR 60. CBG 147. Pt has hx of dementia. Pt is blind in the left eye. Pt is on blood thinners

## 2018-11-07 ENCOUNTER — Emergency Department (HOSPITAL_COMMUNITY)
Admission: EM | Admit: 2018-11-07 | Discharge: 2018-11-10 | Disposition: A | Discharge status: Home / Self Care | Payer: Medicare Other | Attending: Emergency Medicine | Admitting: Emergency Medicine

## 2018-11-07 ENCOUNTER — Telehealth: Payer: Self-pay | Admitting: Internal Medicine

## 2018-11-07 ENCOUNTER — Other Ambulatory Visit: Payer: Self-pay

## 2018-11-07 DIAGNOSIS — F332 Major depressive disorder, recurrent severe without psychotic features: Secondary | ICD-10-CM | POA: Diagnosis not present

## 2018-11-07 DIAGNOSIS — Z20828 Contact with and (suspected) exposure to other viral communicable diseases: Secondary | ICD-10-CM | POA: Diagnosis not present

## 2018-11-07 DIAGNOSIS — E119 Type 2 diabetes mellitus without complications: Secondary | ICD-10-CM | POA: Insufficient documentation

## 2018-11-07 DIAGNOSIS — G20C Parkinsonism, unspecified: Secondary | ICD-10-CM

## 2018-11-07 DIAGNOSIS — I10 Essential (primary) hypertension: Secondary | ICD-10-CM | POA: Diagnosis not present

## 2018-11-07 DIAGNOSIS — Z87891 Personal history of nicotine dependence: Secondary | ICD-10-CM | POA: Insufficient documentation

## 2018-11-07 DIAGNOSIS — H401190 Primary open-angle glaucoma, unspecified eye, stage unspecified: Secondary | ICD-10-CM

## 2018-11-07 DIAGNOSIS — I4891 Unspecified atrial fibrillation: Secondary | ICD-10-CM

## 2018-11-07 DIAGNOSIS — G20 Parkinson's disease: Secondary | ICD-10-CM | POA: Insufficient documentation

## 2018-11-07 DIAGNOSIS — R4182 Altered mental status, unspecified: Secondary | ICD-10-CM | POA: Diagnosis not present

## 2018-11-07 DIAGNOSIS — R443 Hallucinations, unspecified: Secondary | ICD-10-CM

## 2018-11-07 DIAGNOSIS — Z79899 Other long term (current) drug therapy: Secondary | ICD-10-CM | POA: Diagnosis not present

## 2018-11-07 DIAGNOSIS — R008 Other abnormalities of heart beat: Secondary | ICD-10-CM

## 2018-11-07 DIAGNOSIS — F0280 Dementia in other diseases classified elsewhere without behavioral disturbance: Secondary | ICD-10-CM | POA: Diagnosis not present

## 2018-11-07 DIAGNOSIS — R451 Restlessness and agitation: Secondary | ICD-10-CM

## 2018-11-07 DIAGNOSIS — Z7984 Long term (current) use of oral hypoglycemic drugs: Secondary | ICD-10-CM | POA: Diagnosis not present

## 2018-11-07 DIAGNOSIS — Z7901 Long term (current) use of anticoagulants: Secondary | ICD-10-CM | POA: Insufficient documentation

## 2018-11-07 DIAGNOSIS — R296 Repeated falls: Secondary | ICD-10-CM

## 2018-11-07 LAB — URINE CULTURE

## 2018-11-07 MED ORDER — LORAZEPAM 0.5 MG PO TABS: 0.5000 mg | ORAL_TABLET | Freq: Four times a day (QID) | ORAL | 0 refills | Status: DC | PRN

## 2018-11-07 NOTE — ED Notes (Signed)
Pt reports history of bilat cataract surgery

## 2018-11-07 NOTE — Telephone Encounter (Signed)
I communicate with neurology via message, Dr. Posey Pronto, she said  Zyprexa would be a good option for him. I tried to prescribe it however  it interacts with amiodarone. Dr. Posey Pronto will also be in contact with the patient and agreed that placement would be ideal. Please discuss with your wife my recommendations documented earlier today.

## 2018-11-07 NOTE — Telephone Encounter (Signed)
Again tried calling Deborra Medina- no answer- LM informing her that PCP has spoke w/ Monique in great detail regarding Pt today if she wants to reach out to her directly since we keep missing each others phone calls and PCP and I are in clinic seeing other Pt's.

## 2018-11-07 NOTE — Telephone Encounter (Signed)
Urgent referral to palliative care sent- instructed them to contact Pt's daughter [name] to set up consults at [phone]. Pt's daughter aware that palliative care may upgrade Pt to hospice once they visit him and determine care plan. Monique verbalized understanding.

## 2018-11-07 NOTE — Telephone Encounter (Signed)
Patient went to the ER yesterday after multiple falls, reportedly had hallucinations.  At the ER, work-up was essentially negative with a nonacute CT head, neck, normal urinalysis and chest x-ray.  He was confused on exam. Unsteady, shuffling gait. He was requested to be discharged home. They have reordered PT and home health. I got a call from EMS today, they were called because the patient is agitated and unable to sleep.  They are asking me what to do, the family may asked for something to help him calm down and sleep. Please call the family: 1.  Stop Atarax 2.  Send a prescription for Seroquel 25 mg nightly #30 no refills.  This is for nighttime use, will help with insomnia and agitation 3.  Ativan 0.5 mg every 6 hours as needed.  This will help with agitation but will also increase his risk of falling.  The family needs to be very vigilant.  If they are willing to proceed let me know how send a prescription 4.  Recommend palliative care

## 2018-11-07 NOTE — Telephone Encounter (Signed)
Patient wife is calling requesting to speak with PCP or nurse regarding situations  Call back [phone]

## 2018-11-07 NOTE — Telephone Encounter (Signed)
Seroquel interacts with Pacerone.  Will not prescribe it. I called Monique, the patient's daughter, she is quite frustrated with the situation, her father is not doing well obviously, her mother is the only one at home with him.  Monique lives in town but   cannot be there 24/7. I believe the patient needs placement at a nursing home or a memory unit but he is currently at home. For now we agreed on stop Atarax, start Ativan (increased risk of falls discussed), could consider Zyprexa or other antipsychotics, ER again if they can't handle the patient at home. We will communicate with the patient's neurologist for suggestions.

## 2018-11-07 NOTE — ED Triage Notes (Signed)
Pt from home family reports pt talking out of his head and having increased confusion. Fall two days ago striking head see at [facility] treated and released and family reports increased confusion today

## 2018-11-07 NOTE — Telephone Encounter (Signed)
[place], Pt's wife to return call.

## 2018-11-07 NOTE — Telephone Encounter (Signed)
Spoke with the Daughter [name] she stated that her dad has had many falls and the wife is unable to understand when anyone lets her know anything. The daughter states her dad has had falls and the wife does not get him seen until about 2-3 days later even though they have told her he needs to be seen asap after a fall due to the blood thinners that he takes.  Daughter voiced her understanding about medication changes, she stated she really appreciated and thanks Dr. Larose Kells and his assistant for all you have done   I advised daughter she needed to come in to sign a DPR to continue her fathers care communication.  I have ordered the medication  Please send in

## 2018-11-08 ENCOUNTER — Other Ambulatory Visit: Payer: Self-pay

## 2018-11-08 DIAGNOSIS — F0280 Dementia in other diseases classified elsewhere without behavioral disturbance: Secondary | ICD-10-CM

## 2018-11-08 DIAGNOSIS — G20 Parkinson's disease: Secondary | ICD-10-CM

## 2018-11-08 DIAGNOSIS — R4182 Altered mental status, unspecified: Secondary | ICD-10-CM

## 2018-11-08 LAB — CBG MONITORING, ED: Glucose-Capillary: 109 mg/dL — ABNORMAL HIGH (ref 70–99)

## 2018-11-08 MED ORDER — LORAZEPAM 0.5 MG PO TABS: 0.5000 mg | ORAL_TABLET | Freq: Four times a day (QID) | ORAL | Status: DC | PRN

## 2018-11-08 MED ORDER — AMIODARONE HCL 200 MG PO TABS
200.0000 mg | ORAL_TABLET | Freq: Two times a day (BID) | ORAL | Status: DC
  Filled 2018-11-08: qty 1

## 2018-11-08 MED ORDER — AMIODARONE HCL 200 MG PO TABS
200.0000 mg | ORAL_TABLET | Freq: Two times a day (BID) | ORAL | Status: DC
  Administered 2018-11-08 – 2018-11-10 (×6): 200 mg via ORAL
  Filled 2018-11-08 (×6): qty 1

## 2018-11-08 MED ORDER — DORZOLAMIDE HCL-TIMOLOL MAL 2-0.5 % OP SOLN
1.0000 [drp] | Freq: Two times a day (BID) | OPHTHALMIC | Status: DC
  Administered 2018-11-08 – 2018-11-10 (×5): 1 [drp] via OPHTHALMIC
  Filled 2018-11-08: qty 10

## 2018-11-08 MED ORDER — BOOST HIGH PROTEIN PO LIQD
1.0000 | ORAL | Status: DC
  Filled 2018-11-08: qty 237

## 2018-11-08 MED ORDER — ACARBOSE 25 MG PO TABS
50.0000 mg | ORAL_TABLET | Freq: Three times a day (TID) | ORAL | Status: DC
  Administered 2018-11-08 – 2018-11-10 (×8): 50 mg via ORAL
  Filled 2018-11-08 (×10): qty 2

## 2018-11-08 MED ORDER — ATORVASTATIN CALCIUM 20 MG PO TABS
20.0000 mg | ORAL_TABLET | Freq: Every day | ORAL | Status: DC
  Administered 2018-11-08 – 2018-11-09 (×2): 20 mg via ORAL
  Filled 2018-11-08 (×3): qty 1

## 2018-11-08 MED ORDER — GLIMEPIRIDE 4 MG PO TABS
6.0000 mg | ORAL_TABLET | Freq: Every day | ORAL | Status: DC
  Administered 2018-11-08 – 2018-11-10 (×3): 6 mg via ORAL
  Filled 2018-11-08 (×3): qty 1

## 2018-11-08 MED ORDER — APIXABAN 5 MG PO TABS
5.0000 mg | ORAL_TABLET | Freq: Two times a day (BID) | ORAL | Status: DC
  Administered 2018-11-08 – 2018-11-10 (×5): 5 mg via ORAL
  Filled 2018-11-08 (×6): qty 1

## 2018-11-08 MED ORDER — CARBIDOPA-LEVODOPA 25-100 MG PO TABS
1.5000 | ORAL_TABLET | ORAL | Status: DC
  Administered 2018-11-08 – 2018-11-10 (×8): 1.5 via ORAL
  Filled 2018-11-08 (×9): qty 1.5

## 2018-11-08 MED ORDER — ACETAMINOPHEN 325 MG PO TABS: 650.0000 mg | ORAL_TABLET | Freq: Four times a day (QID) | ORAL | Status: DC | PRN

## 2018-11-08 MED ORDER — BOOST PLUS PO LIQD
237.0000 mL | ORAL | Status: DC
  Administered 2018-11-08: 237 mL via ORAL
  Filled 2018-11-08 (×2): qty 237

## 2018-11-08 NOTE — Progress Notes (Addendum)
CSW spoke with staff member at [facility] who states this patient has been added to the waiting list for admission, no beds currently available.  CSW spoke with Lovell Sheehan at [facility] who states the referral for this patient was received but has not yet been reviewed, Phoebe to return call to [place].  CSW spoke with Benjamine Mola at [facility] who states the facility has no bed availability today.  CSW attempted to reach staff at [facility] without success, voicemail was left requesting a return call.  Madilyn Fireman, MSW, LCSW-A Transitions of Care  Clinical Social Worker  [facility] Emergency Departments  Medical ICU [phone]

## 2018-11-08 NOTE — BH Assessment (Signed)
Received TTS consult request. [facility] is unable to establish a stable connection in [place]. Attempted telephone assessment but RN called away for a critical patient. TTS will come to [facility] for face-to-face assessment.   Evelena Peat, [facility], [facility], [facility] Triage Specialist [phone]
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
## 2018-11-08 NOTE — ED Notes (Signed)
Patients BP is 180/95. MD made aware.
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
## 2018-11-08 NOTE — Progress Notes (Signed)
CSW faxed patient's information to the following facilities for review:  [facility]  [facility]  [facility]     Madilyn Fireman, MSW, LCSW-A Transitions of Care  Clinical Social Worker  [facility] Emergency Departments  Medical ICU [phone]

## 2018-11-08 NOTE — ED Notes (Signed)
Patient bed change and clean up x 2.

## 2018-11-08 NOTE — ED Notes (Signed)
Assisted pt with drinking his Boost.

## 2018-11-09 LAB — CBC WITH DIFFERENTIAL/PLATELET
Abs Immature Granulocytes: 0.04 10*3/uL (ref 0.00–0.07)
Basophils Absolute: 0 10*3/uL (ref 0.0–0.1)
Basophils Relative: 1 %
Eosinophils Absolute: 0.1 10*3/uL (ref 0.0–0.5)
Eosinophils Relative: 2 %
HCT: 41.2 % (ref 39.0–52.0)
Hemoglobin: 12.8 g/dL — ABNORMAL LOW (ref 13.0–17.0)
Immature Granulocytes: 1 %
Lymphocytes Relative: 20 %
Lymphs Abs: 0.8 10*3/uL (ref 0.7–4.0)
MCH: 28.6 pg (ref 26.0–34.0)
MCHC: 31.1 g/dL (ref 30.0–36.0)
MCV: 92 fL (ref 80.0–100.0)
Monocytes Absolute: 0.6 10*3/uL (ref 0.1–1.0)
Monocytes Relative: 14 %
Neutro Abs: 2.6 10*3/uL (ref 1.7–7.7)
Neutrophils Relative %: 62 %
Platelets: 94 10*3/uL — ABNORMAL LOW (ref 150–400)
RBC: 4.48 MIL/uL (ref 4.22–5.81)
RDW: 15.1 % (ref 11.5–15.5)
WBC: 4.1 10*3/uL (ref 4.0–10.5)
nRBC: 0 % (ref 0.0–0.2)

## 2018-11-09 LAB — BASIC METABOLIC PANEL
Anion gap: 10 (ref 5–15)
BUN: 32 mg/dL — ABNORMAL HIGH (ref 8–23)
CO2: 25 mmol/L (ref 22–32)
Calcium: 9.1 mg/dL (ref 8.9–10.3)
Chloride: 106 mmol/L (ref 98–111)
Creatinine, Ser: 1.52 mg/dL — ABNORMAL HIGH (ref 0.61–1.24)
GFR calc Af Amer: 48 mL/min — ABNORMAL LOW (ref >60)
GFR calc non Af Amer: 42 mL/min — ABNORMAL LOW (ref >60)
Glucose, Bld: 110 mg/dL — ABNORMAL HIGH (ref 70–99)
Potassium: 3.8 mmol/L (ref 3.5–5.1)
Sodium: 141 mmol/L (ref 135–145)

## 2018-11-09 LAB — CBG MONITORING, ED: Glucose-Capillary: 65 mg/dL — ABNORMAL LOW (ref 70–99)

## 2018-11-09 NOTE — Consult Note (Addendum)
Patient seen via tele psych assessment today. Collateral information received from his nurse who reports the patient has been calm and cooperative and following directions.  States he slept well, and no longer endorses dizziness today.  He also denies audible or visual hallucinations.  He continues to meet criteria for geri psych and currently awaiting placement.  Patient seen face-to-face for psychiatric evaluation, chart reviewed and case discussed with the physician extender and developed treatment plan. Reviewed the information documented and agree with the treatment plan. Corena Pilgrim, MD

## 2018-11-09 NOTE — ED Notes (Signed)
Redrawn CMP to lab.

## 2018-11-09 NOTE — ED Notes (Signed)
Patient given water and Boost. Patient requested to use the urinal, 200 mL of dark urine output. New brief and linens provided because pt had an episode of incontinence before urinal use.
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
## 2018-11-09 NOTE — ED Provider Notes (Signed)
Pt presented for delusions.  Pt evaluated by Dr Dayna Barker on 10/17.  Extensive workup on 10/15 with labs, head ct for same issue.   No labs ordered for this visit.  Will recheck CBC and BMET.  Waiting for inpatient geripsych treatment.    Vitals:   11/09/18 0700 11/09/18 0738  BP: 118/81 117/73  Pulse: (!) 59 61  Resp:    Temp:    SpO2: 100% 100%      Dorie Rank, MD 11/09/18 [phone]

## 2018-11-09 NOTE — Progress Notes (Signed)
CSW contacted referral facilities with the following results:  Still reviewing: [facility] (voicemail left) [facility] (waitlist) [facility] (resent) [facility] (no answer) [facility] (resent)  Declined: [facility] (at capacity but told to try tomorrow)  Referral faxed out to the following facilities: [facility]   TTS will continue to seek bed placement.   Chalmers Guest. Guerry Bruin, MSW, [place] [facility] Work/Disposition Phone: [phone] Fax: [phone]

## 2018-11-10 ENCOUNTER — Encounter (HOSPITAL_COMMUNITY): Payer: Self-pay | Admitting: Registered Nurse

## 2018-11-10 LAB — SARS CORONAVIRUS 2 (TAT 6-24 HRS): SARS Coronavirus 2: NEGATIVE

## 2018-11-10 NOTE — ED Notes (Signed)
[facility] has been placed. Pt has been informed.

## 2018-11-10 NOTE — ED Notes (Signed)
TTS at bedside. 

## 2018-11-10 NOTE — BH Assessment (Signed)
[place] Assessment Progress Note  Per Hampton Abbot, MD, this pt does not require psychiatric hospitalization at this time.  Pt is to be discharged from [facility]  No behavioral health resources are indicated for this pt at this time.  At the request of Earleen Newport, FNP this writer called pt's daughter, Dorman Clinkscales [phone]), to arrange for pt's return home.  Call was placed at 12:28, and I left a HIPAA compliant voice message.  Return call is pending as of this writing.  Pt's nurse has been notified.  Jalene Mullet, [place] Triage Specialist [phone]

## 2018-11-10 NOTE — ED Notes (Signed)
Attempted to call daughter, Khylar Kepple, [phone]. Left voice  Message, but no answer. Also, tried to call patients home [phone]) and cell [phone]) number from his demographic with no answer.

## 2018-11-10 NOTE — ED Notes (Signed)
Assisted patient with a bed pan. He did not have a bowel movement.
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
## 2018-11-10 NOTE — BH Assessment (Signed)
[facility] Assessment Progress Note  At 14:38 pt's daughter, Darsh Rooke, calls in response to my message.  I informed her that pt does not require psychiatric hospitalization at this time, nor does he need outpatient referrals, per Dr Dwyane Dee.  She concurs with this opinion, and agrees to present at [facility] within half an hour to pick him up.  Pt's nurse has been notified.  Jalene Mullet, [place] Coordinator [phone]

## 2018-11-12 ENCOUNTER — Telehealth: Payer: Self-pay

## 2018-11-12 DIAGNOSIS — F419 Anxiety disorder, unspecified: Secondary | ICD-10-CM

## 2018-11-12 DIAGNOSIS — R296 Repeated falls: Secondary | ICD-10-CM

## 2018-11-12 DIAGNOSIS — R451 Restlessness and agitation: Secondary | ICD-10-CM

## 2018-11-12 DIAGNOSIS — R269 Unspecified abnormalities of gait and mobility: Secondary | ICD-10-CM

## 2018-11-12 DIAGNOSIS — G20C Parkinsonism, unspecified: Secondary | ICD-10-CM

## 2018-11-12 DIAGNOSIS — I10 Essential (primary) hypertension: Secondary | ICD-10-CM

## 2018-11-12 DIAGNOSIS — E119 Type 2 diabetes mellitus without complications: Secondary | ICD-10-CM

## 2018-11-12 DIAGNOSIS — R55 Syncope and collapse: Secondary | ICD-10-CM

## 2018-11-12 DIAGNOSIS — R443 Hallucinations, unspecified: Secondary | ICD-10-CM

## 2018-11-12 DIAGNOSIS — G20 Parkinson's disease: Secondary | ICD-10-CM

## 2018-11-12 NOTE — Telephone Encounter (Signed)
Copied from [place] [id number]. Topic: General - Inquiry >> Nov 12, 2018 12:42 PM Richardo Priest, NT wrote: Reason for CRM: Patient's daughter called in to request a referral for assisted living home due to health declining and needing care. Patient's daughter says it is not in regards to memory loss but that he is needing help on a regular basis they cannot provide. Please advise. Call back is [phone].

## 2018-11-12 NOTE — Telephone Encounter (Addendum)
Note from the ER reviewed, pt's daughter  was made aware and agreed that did not require hospitalization, if she feels her father needs to be in a facility , rec  to contact the several facilities in this area  and I will happy to fill out a  FL-2  to for him.  ===== Notes from behavioral health 11/10/2018: 1. Per Hampton Abbot, MD, this pt does not require psychiatric hospitalization at this time.  Pt is to be discharged from [facility].  No behavioral health resources are indicated for this pt at this time.  At the request of Earleen Newport, FNP this writer called pt's daughter, Donnis Snowdon [phone]), to arrange for pt's return home.  Call was placed at 12:28, and I left a HIPAA compliant voice message.  Return call is pending as of this writing.  Pt's nurse has been notified.  2. At 14:38 pt's daughter, Mohamedali Doty, calls in response to my message.  I informed her that pt does not require psychiatric hospitalization at this time, nor does he need outpatient referrals, per Dr Dwyane Dee.  She concurs with this opinion, and agrees to present at [facility] within half an hour to pick him up.  Pt's nurse has been notified.

## 2018-11-13 NOTE — Telephone Encounter (Signed)
Dylan Orozco- can you help with this family please?

## 2018-11-13 NOTE — Telephone Encounter (Signed)
Pt returning call and stated if it goes to vm she will call right back b/c her phone is ringing

## 2018-11-13 NOTE — Telephone Encounter (Addendum)
LM requesting call back to discuss next steps with facility placement.    4:38pm--Several attempts made to contact daughter, continue to miss each other. Direct number provided with last message left.

## 2018-11-14 NOTE — Telephone Encounter (Signed)
Patient's wife called in stating she can no longer handle her husband and would like to discuss the options of moving husband into a facility. Please advise.

## 2018-11-14 NOTE — Telephone Encounter (Signed)
Spoke with wife, Dylan Orozco.  Discussed with wife the [facility] referral that was placed today for assessment.  Wife is agreeable with plan and appreciative of assistance.  Advised to call with any additional concerns.

## 2018-11-14 NOTE — Telephone Encounter (Signed)
Plan as described by Mrs. Development worker, international aid. If the wife feels that she can no longer handle the patient and  needs immediate assistance, needs to go to the ER, I cannot admit a patient to a facility quickly from my office.

## 2018-11-14 NOTE — Telephone Encounter (Signed)
Spoke with daughter, [name].  [name] has concerns patient is not being taken care of by wife and is unable to take of himself.  Daughter originally requested assistance with facility placement, but now would like to start with Home Health assessment. Discussed the option of APS being involved for negligence, daughter states pts wife would "fake the visit" as if pt is fine.  Per verbal order from PCP, order placed for [facility] assessment for home safety, ADLs and medication administration.

## 2018-11-15 ENCOUNTER — Emergency Department (HOSPITAL_COMMUNITY): Payer: Medicare Other

## 2018-11-15 ENCOUNTER — Inpatient Hospital Stay (HOSPITAL_COMMUNITY)
Admission: EM | Admit: 2018-11-15 | Discharge: 2018-11-25 | DRG: 871 | Disposition: A | Discharge status: Hospice | Payer: Medicare Other | Attending: Internal Medicine | Admitting: Internal Medicine

## 2018-11-15 ENCOUNTER — Encounter (HOSPITAL_COMMUNITY): Payer: Self-pay | Admitting: Pharmacy Technician

## 2018-11-15 ENCOUNTER — Other Ambulatory Visit: Payer: Self-pay

## 2018-11-15 DIAGNOSIS — R6520 Severe sepsis without septic shock: Secondary | ICD-10-CM | POA: Diagnosis present

## 2018-11-15 DIAGNOSIS — G20 Parkinson's disease: Secondary | ICD-10-CM | POA: Diagnosis not present

## 2018-11-15 DIAGNOSIS — Z9049 Acquired absence of other specified parts of digestive tract: Secondary | ICD-10-CM

## 2018-11-15 DIAGNOSIS — E870 Hyperosmolality and hypernatremia: Secondary | ICD-10-CM | POA: Diagnosis not present

## 2018-11-15 DIAGNOSIS — N1831 Chronic kidney disease, stage 3a: Secondary | ICD-10-CM | POA: Diagnosis not present

## 2018-11-15 DIAGNOSIS — Z66 Do not resuscitate: Secondary | ICD-10-CM | POA: Diagnosis not present

## 2018-11-15 DIAGNOSIS — J690 Pneumonitis due to inhalation of food and vomit: Secondary | ICD-10-CM | POA: Diagnosis present

## 2018-11-15 DIAGNOSIS — Z9181 History of falling: Secondary | ICD-10-CM

## 2018-11-15 DIAGNOSIS — E782 Mixed hyperlipidemia: Secondary | ICD-10-CM | POA: Diagnosis present

## 2018-11-15 DIAGNOSIS — E119 Type 2 diabetes mellitus without complications: Secondary | ICD-10-CM

## 2018-11-15 DIAGNOSIS — Z515 Encounter for palliative care: Secondary | ICD-10-CM | POA: Diagnosis not present

## 2018-11-15 DIAGNOSIS — Z6821 Body mass index (BMI) 21.0-21.9, adult: Secondary | ICD-10-CM

## 2018-11-15 DIAGNOSIS — Z8249 Family history of ischemic heart disease and other diseases of the circulatory system: Secondary | ICD-10-CM

## 2018-11-15 DIAGNOSIS — N179 Acute kidney failure, unspecified: Secondary | ICD-10-CM | POA: Diagnosis present

## 2018-11-15 DIAGNOSIS — R531 Weakness: Secondary | ICD-10-CM | POA: Diagnosis present

## 2018-11-15 DIAGNOSIS — L89611 Pressure ulcer of right heel, stage 1: Secondary | ICD-10-CM | POA: Diagnosis not present

## 2018-11-15 DIAGNOSIS — A419 Sepsis, unspecified organism: Secondary | ICD-10-CM | POA: Diagnosis not present

## 2018-11-15 DIAGNOSIS — H5462 Unqualified visual loss, left eye, normal vision right eye: Secondary | ICD-10-CM | POA: Diagnosis present

## 2018-11-15 DIAGNOSIS — L89621 Pressure ulcer of left heel, stage 1: Secondary | ICD-10-CM | POA: Diagnosis not present

## 2018-11-15 DIAGNOSIS — Z20828 Contact with and (suspected) exposure to other viral communicable diseases: Secondary | ICD-10-CM | POA: Diagnosis not present

## 2018-11-15 DIAGNOSIS — E1122 Type 2 diabetes mellitus with diabetic chronic kidney disease: Secondary | ICD-10-CM | POA: Diagnosis not present

## 2018-11-15 DIAGNOSIS — G9341 Metabolic encephalopathy: Secondary | ICD-10-CM | POA: Diagnosis not present

## 2018-11-15 DIAGNOSIS — Z79899 Other long term (current) drug therapy: Secondary | ICD-10-CM

## 2018-11-15 DIAGNOSIS — R64 Cachexia: Secondary | ICD-10-CM | POA: Diagnosis not present

## 2018-11-15 DIAGNOSIS — H401190 Primary open-angle glaucoma, unspecified eye, stage unspecified: Secondary | ICD-10-CM | POA: Diagnosis present

## 2018-11-15 DIAGNOSIS — Z7984 Long term (current) use of oral hypoglycemic drugs: Secondary | ICD-10-CM

## 2018-11-15 DIAGNOSIS — Z888 Allergy status to other drugs, medicaments and biological substances status: Secondary | ICD-10-CM

## 2018-11-15 DIAGNOSIS — Z86010 Personal history of colonic polyps: Secondary | ICD-10-CM

## 2018-11-15 DIAGNOSIS — W19XXXA Unspecified fall, initial encounter: Secondary | ICD-10-CM

## 2018-11-15 DIAGNOSIS — R1310 Dysphagia, unspecified: Secondary | ICD-10-CM | POA: Diagnosis present

## 2018-11-15 DIAGNOSIS — Z7901 Long term (current) use of anticoagulants: Secondary | ICD-10-CM

## 2018-11-15 DIAGNOSIS — Z833 Family history of diabetes mellitus: Secondary | ICD-10-CM

## 2018-11-15 DIAGNOSIS — I10 Essential (primary) hypertension: Secondary | ICD-10-CM | POA: Diagnosis present

## 2018-11-15 DIAGNOSIS — Z9842 Cataract extraction status, left eye: Secondary | ICD-10-CM

## 2018-11-15 DIAGNOSIS — I480 Paroxysmal atrial fibrillation: Secondary | ICD-10-CM | POA: Diagnosis present

## 2018-11-15 DIAGNOSIS — F0280 Dementia in other diseases classified elsewhere without behavioral disturbance: Secondary | ICD-10-CM | POA: Diagnosis present

## 2018-11-15 DIAGNOSIS — L89153 Pressure ulcer of sacral region, stage 3: Secondary | ICD-10-CM | POA: Diagnosis not present

## 2018-11-15 DIAGNOSIS — F419 Anxiety disorder, unspecified: Secondary | ICD-10-CM | POA: Diagnosis present

## 2018-11-15 DIAGNOSIS — L8990 Pressure ulcer of unspecified site, unspecified stage: Secondary | ICD-10-CM | POA: Insufficient documentation

## 2018-11-15 DIAGNOSIS — E785 Hyperlipidemia, unspecified: Secondary | ICD-10-CM | POA: Diagnosis present

## 2018-11-15 DIAGNOSIS — Z7189 Other specified counseling: Secondary | ICD-10-CM

## 2018-11-15 DIAGNOSIS — Z8546 Personal history of malignant neoplasm of prostate: Secondary | ICD-10-CM

## 2018-11-15 DIAGNOSIS — Z87891 Personal history of nicotine dependence: Secondary | ICD-10-CM

## 2018-11-15 DIAGNOSIS — I452 Bifascicular block: Secondary | ICD-10-CM | POA: Diagnosis present

## 2018-11-15 DIAGNOSIS — Z8701 Personal history of pneumonia (recurrent): Secondary | ICD-10-CM

## 2018-11-15 DIAGNOSIS — R627 Adult failure to thrive: Secondary | ICD-10-CM | POA: Diagnosis present

## 2018-11-15 DIAGNOSIS — I129 Hypertensive chronic kidney disease with stage 1 through stage 4 chronic kidney disease, or unspecified chronic kidney disease: Secondary | ICD-10-CM | POA: Diagnosis not present

## 2018-11-15 DIAGNOSIS — R296 Repeated falls: Secondary | ICD-10-CM | POA: Diagnosis present

## 2018-11-15 DIAGNOSIS — Z841 Family history of disorders of kidney and ureter: Secondary | ICD-10-CM

## 2018-11-15 DIAGNOSIS — Z800 Family history of malignant neoplasm of digestive organs: Secondary | ICD-10-CM

## 2018-11-15 DIAGNOSIS — Z9841 Cataract extraction status, right eye: Secondary | ICD-10-CM

## 2018-11-15 LAB — CBC
HCT: 40.4 % (ref 39.0–52.0)
Hemoglobin: 12.4 g/dL — ABNORMAL LOW (ref 13.0–17.0)
MCH: 28.8 pg (ref 26.0–34.0)
MCHC: 30.7 g/dL (ref 30.0–36.0)
MCV: 93.7 fL (ref 80.0–100.0)
Platelets: 132 10*3/uL — ABNORMAL LOW (ref 150–400)
RBC: 4.31 MIL/uL (ref 4.22–5.81)
RDW: 14.6 % (ref 11.5–15.5)
WBC: 6.2 10*3/uL (ref 4.0–10.5)
nRBC: 0 % (ref 0.0–0.2)

## 2018-11-15 LAB — BASIC METABOLIC PANEL
Anion gap: 10 (ref 5–15)
BUN: 47 mg/dL — ABNORMAL HIGH (ref 8–23)
CO2: 22 mmol/L (ref 22–32)
Calcium: 8.8 mg/dL — ABNORMAL LOW (ref 8.9–10.3)
Chloride: 112 mmol/L — ABNORMAL HIGH (ref 98–111)
Creatinine, Ser: 1.64 mg/dL — ABNORMAL HIGH (ref 0.61–1.24)
GFR calc Af Amer: 44 mL/min — ABNORMAL LOW (ref >60)
GFR calc non Af Amer: 38 mL/min — ABNORMAL LOW (ref >60)
Glucose, Bld: 198 mg/dL — ABNORMAL HIGH (ref 70–99)
Potassium: 4.1 mmol/L (ref 3.5–5.1)
Sodium: 144 mmol/L (ref 135–145)

## 2018-11-15 MED ORDER — ACARBOSE 25 MG PO TABS
50.0000 mg | ORAL_TABLET | Freq: Three times a day (TID) | ORAL | Status: DC
  Administered 2018-11-16 – 2018-11-18 (×8): 50 mg via ORAL
  Filled 2018-11-15: qty 2
  Filled 2018-11-15 (×4): qty 1
  Filled 2018-11-15 (×11): qty 2

## 2018-11-15 MED ORDER — VITAMIN D 25 MCG (1000 UNIT) PO TABS
1000.0000 [IU] | ORAL_TABLET | Freq: Every day | ORAL | Status: DC
  Administered 2018-11-15 – 2018-11-18 (×4): 1000 [IU] via ORAL
  Filled 2018-11-15 (×5): qty 1

## 2018-11-15 MED ORDER — DORZOLAMIDE HCL-TIMOLOL MAL 2-0.5 % OP SOLN
1.0000 [drp] | Freq: Two times a day (BID) | OPHTHALMIC | Status: DC
  Administered 2018-11-15 – 2018-11-24 (×17): 1 [drp] via OPHTHALMIC
  Filled 2018-11-15 (×3): qty 10

## 2018-11-15 MED ORDER — CARBIDOPA-LEVODOPA 25-100 MG PO TABS
1.5000 | ORAL_TABLET | Freq: Three times a day (TID) | ORAL | Status: DC
  Administered 2018-11-15 – 2018-11-24 (×18): 1.5 via ORAL
  Filled 2018-11-15 (×30): qty 1.5

## 2018-11-15 MED ORDER — APIXABAN 2.5 MG PO TABS
2.5000 mg | ORAL_TABLET | Freq: Two times a day (BID) | ORAL | Status: DC
  Administered 2018-11-15 – 2018-11-18 (×7): 2.5 mg via ORAL
  Filled 2018-11-15 (×12): qty 1

## 2018-11-15 MED ORDER — LORAZEPAM 0.5 MG PO TABS: 0.5000 mg | ORAL_TABLET | Freq: Four times a day (QID) | ORAL | Status: DC | PRN

## 2018-11-15 MED ORDER — LATANOPROST 0.005 % OP SOLN
1.0000 [drp] | Freq: Every day | OPHTHALMIC | Status: DC
  Administered 2018-11-15 – 2018-11-23 (×9): 1 [drp] via OPHTHALMIC
  Filled 2018-11-15 (×2): qty 2.5

## 2018-11-15 MED ORDER — GLIMEPIRIDE 4 MG PO TABS
6.0000 mg | ORAL_TABLET | Freq: Every day | ORAL | Status: DC
  Administered 2018-11-16 – 2018-11-18 (×3): 6 mg via ORAL
  Filled 2018-11-15 (×6): qty 1

## 2018-11-15 MED ORDER — ACETAMINOPHEN 325 MG PO TABS
650.0000 mg | ORAL_TABLET | Freq: Four times a day (QID) | ORAL | Status: DC | PRN
  Administered 2018-11-15: 650 mg via ORAL
  Filled 2018-11-15: qty 2

## 2018-11-15 MED ORDER — ATORVASTATIN CALCIUM 10 MG PO TABS
20.0000 mg | ORAL_TABLET | Freq: Every day | ORAL | Status: DC
  Administered 2018-11-15 – 2018-11-18 (×4): 20 mg via ORAL
  Filled 2018-11-15 (×5): qty 2

## 2018-11-15 NOTE — [place] FL2 (Signed)
[place] MEDICAID FL2 LEVEL OF CARE SCREENING TOOL     IDENTIFICATION  Patient Name: Dylan Orozco Birthdate: [birth year] Sex: male Admission Date (Current Location): 11/15/2018  [facility] and [place] Number:  Herbalist and Address:  [facility], [place] [address] [place], [place] [address]      Provider Number: [id number]  Attending Physician Name and Address:  Varney Biles, MD  Relative Name and Phone Number:  Rahkim Rabalais, wife, [phone]    Current Level of Care: Hospital Recommended Level of Care: [place] Prior Approval Number:    Date Approved/Denied:   PASRR Number: Pending  Discharge Plan: SNF    Current Diagnoses: Patient Active Problem List   Diagnosis Date Noted  . Major neurocognitive disorder due to another medical condition without behavioral disturbance ([place]) 11/08/2018  . Pemphigus 01/26/2018  . Dysarthria 01/01/2018  . Altered mental status 01/01/2018  . Bullous pemphigoid 01/01/2018  . Atrial fibrillation with RVR ([place]) 12/13/2017  . Thrombocytopenia ([place]) 12/13/2017  . Normochromic normocytic anemia 12/13/2017  . Ectatic abdominal aorta ([place]) 09/23/2017  . Trigeminy 08/21/2017  . Anxiety 07/02/2017  . PCP NOTES >>>>>>>>>>>>>>>>>>>>>>>>>>>>>>>> 09/23/2014  . Parkinsonism ([place]) 08/25/2014  . Constipation 10/15/2013  . Annual physical exam 09/10/2013  . HTN (hypertension) 07/06/2013  . Onychomycosis 05/23/2012  . Open angle primary glaucoma 01/24/2011  . Diabetes mellitus ([place]) 11/04/2009  . Pancytopenia ([place]) 11/04/2009  . PROSTATE CANCER, HX OF 10/05/2008  . COLONIC POLYPS, HX OF 10/05/2008  . HYPERLIPIDEMIA 03/04/2007  . WEIGHT LOSS 08/15/2006    Orientation RESPIRATION BLADDER Height & Weight     Situation, Place, Self, Time  Normal   Weight:   Height:     BEHAVIORAL SYMPTOMS/MOOD NEUROLOGICAL BOWEL NUTRITION STATUS  Other (Comment)(History of dementia related  bizarre/agitated behaviors)        AMBULATORY STATUS COMMUNICATION OF NEEDS Skin   Limited Assist Verbally Normal                       Personal Care Assistance Level of Assistance  Bathing, Feeding, Dressing Bathing Assistance: Maximum assistance Feeding assistance: Limited assistance Dressing Assistance: Limited assistance     Functional Limitations Info  Hearing   Hearing Info: Impaired(HOH, no device)      SPECIAL CARE FACTORS FREQUENCY  PT (By licensed PT), OT (By licensed OT)     PT Frequency: 5x weekly OT Frequency: 5xweekly            Contractures Contractures Info: Not present    Additional Factors Info  Code Status Code Status Info: Prior             Current Medications (11/15/2018):  This is the current hospital active medication list No current facility-administered medications for this encounter.    Current Outpatient Medications  Medication Sig Dispense Refill  . acarbose (PRECOSE) 50 MG tablet Take 1 tablet (50 mg total) by mouth 3 (three) times daily with meals. 270 tablet 1  . acetaminophen (TYLENOL) 325 MG tablet Take 2 tablets (650 mg total) by mouth every 6 (six) hours as needed for mild pain, fever or headache.    Marland Kitchen amiodarone (PACERONE) 200 MG tablet Take 1 tablet (200 mg total) by mouth 2 (two) times daily. 180 tablet 1  . apixaban (ELIQUIS) 5 MG TABS tablet Take 1 tablet (5 mg total) by mouth 2 (two) times daily. 180 tablet 1  . atorvastatin (LIPITOR) 20 MG tablet Take 1 tablet (  20 mg total) by mouth daily. 90 tablet 1  . blood glucose meter kit and supplies Check blood sugars once daily. 1 each 0  . Blood Glucose Monitoring Suppl (ONETOUCH VERIO FLEX SYSTEM) w/Device KIT Check blood sugar once daily 1 kit 0  . carbidopa-levodopa (SINEMET IR) 25-100 MG tablet Take 1.5 tablets by mouth 3 (three) times daily. 7am, noon, 5pm 405 tablet 3  . cholecalciferol (VITAMIN D3) 25 MCG (1000 UT) tablet Take 1,000 Units by mouth daily.    .  dorzolamide-timolol (COSOPT) 22.3-6.8 MG/ML ophthalmic solution Place 1 drop into both eyes 2 (two) times daily.  11  . feeding supplement (BOOST HIGH PROTEIN) LIQD Take 1 Container by mouth 2 (two) times a week.    Marland Kitchen glimepiride (AMARYL) 2 MG tablet Take 3 tablets (6 mg total) by mouth daily with breakfast. 270 tablet 1  . glucose blood (ONETOUCH VERIO) test strip Check blood sugar once daily 30 each 0  . hydroxypropyl methylcellulose / hypromellose (ISOPTO TEARS / GONIOVISC) 2.5 % ophthalmic solution Place 1 drop into both eyes 3 (three) times daily as needed for dry eyes.    Marland Kitchen latanoprost (XALATAN) 0.005 % ophthalmic solution Place 1 drop into the right eye at bedtime.     Marland Kitchen LORazepam (ATIVAN) 0.5 MG tablet Take 1 tablet (0.5 mg total) by mouth every 6 (six) hours as needed for anxiety or sedation. 40 tablet 0  . Multiple Vitamin (MULTIVITAMIN WITH MINERALS) TABS Take 1 tablet by mouth daily.    . ONE TOUCH LANCETS MISC Check blood sugar once daily 30 each 0     Discharge Medications: Please see discharge summary for a list of discharge medications.  Relevant Imaging Results:  Relevant Lab Results:   Additional [place], LCSW

## 2018-11-15 NOTE — ED Triage Notes (Signed)
Pt bib ems from home after multiple falls. Pt with 2 falls today. Denies hitting head or any pain. Pt on eliquis. Alert to baseline. Hx parkinsons. Per ems, family also concerned about increase in generalized weakness over the last few weeks. Initial BP on scene 88palpated. 500 cc bolus given by ems with improvement to 112/76. HR 70 RBBB. 98% RA. Pt in NAD. Family requesting social work involvement for possible SNF placement.

## 2018-11-15 NOTE — ED Notes (Signed)
Pt assisted by NT and reassured that we will be taking care of him. Pt calm at this time.
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
## 2018-11-15 NOTE — ED Notes (Signed)
Pt blind - being fed dinner by NT.

## 2018-11-15 NOTE — ED Notes (Signed)
Pt is speaking to wife at this time.

## 2018-11-15 NOTE — Care Management (Addendum)
ED CM and CSW met with patient at bedside, patient requesting we contact his daughter "[name]" to come and get him.  Patient affect flat and appears weak. CSW will contact and follow up with  family. PT consult placed awaiting eval   Laurena Slimmer RN, BSN  ED Care Manager [phone]

## 2018-11-15 NOTE — TOC Initial Note (Signed)
Transition of Care [facility]) - Initial/Assessment Note    Patient Details  Name: Dylan Orozco MRN: [id number] Date of Birth: [birth year]  [facility] CM/SW Contact:    Oretha Milch, LCSW Phone Number: 11/15/2018, 4:54 PM  Clinical Narrative: CSW received consult for social concerns, family supports and skilled nursing facility. CSW met with patient and assessed for skilled nursing facility  concerns. CSW notes patient reports that provided verbal consent to speak with family about looking at placement. Patient was guarded to discussion of skilled nursing facility. CSW discussed community resources and options for support.    CSW spoke with daughter regarding patient's social concerns and family supports concerns/questions. CSW gathered relevant collateral on patient's social environment and current lifestyle. CSW notes per daughter that patient's living situation with his wife had some concerns as she felt his wife did not provide care or support the patient. CSW noted these factors regarding patient's current living situation and health. CSW will provide support to patient with these concerns by coordinating discharge planning with case management and the wife. CSW notes case management spoke with the wife and noted she presented as involved in patient's health and safety.    SNF: CSW informed patient and daughter and spouse of SNFs in the [place] are that patient may qualify for with their medical needs and insurance coverage. CSW informed patient of the following possible barriers to placement: current medical status, mental health concerns and insurance authorization. CSW informed patient that as patient is in the emergency department their choice of SNF is still their choice, however due to the nature of emergency department services a bed offer decision needs to be made quickly due to limitations on ED service. . CSW inquired if patient or spouse had further questions at this  time and noted no current questions or concerns.               Expected Discharge Plan: Skilled Nursing Facility Barriers to Discharge: Insurance Authorization   Patient Goals and CMS Choice Patient states their goals for this hospitalization and ongoing recovery are:: "I want to be back at home." CMS Medicare.gov Compare Post Acute Care list provided to:: Patient Choice offered to / list presented to : Patient, Spouse  Expected Discharge Plan and Services Expected Discharge Plan: [place] In-house Referral: Clinical Social Work Discharge Planning Services: CM Consult Post Acute Care Choice: [place] arrangements for the past 2 months: [place]                                      Prior Living Arrangements/Services Living arrangements for the past 2 months: Single Family Home Lives with:: Spouse Patient language and need for interpreter reviewed:: Yes Do you feel safe going back to the place where you live?: Yes      Need for Family Participation in Patient Care: Yes (Comment) Care giver support system in place?: Yes (comment) Current home services: Home OT, Home PT Criminal Activity/Legal Involvement Pertinent to Current Situation/Hospitalization: No - Comment as needed  Activities of Daily Living      Permission Sought/Granted Permission sought to share information with : Family Supports, Chartered certified accountant granted to share information with : Yes, Verbal Permission Granted              Emotional Assessment Appearance:: Appears stated age, Disheveled Attitude/Demeanor/Rapport: Guarded Affect (typically observed):  Calm   Alcohol / Substance Use: Not Applicable Psych Involvement: No (comment)  Admission diagnosis:  falls Patient Active Problem List   Diagnosis Date Noted  . Major neurocognitive disorder due to another medical condition without behavioral disturbance ([place]) 11/08/2018   . Pemphigus 01/26/2018  . Dysarthria 01/01/2018  . Altered mental status 01/01/2018  . Bullous pemphigoid 01/01/2018  . Atrial fibrillation with RVR ([place]) 12/13/2017  . Thrombocytopenia ([place]) 12/13/2017  . Normochromic normocytic anemia 12/13/2017  . Ectatic abdominal aorta ([place]) 09/23/2017  . Trigeminy 08/21/2017  . Anxiety 07/02/2017  . PCP NOTES >>>>>>>>>>>>>>>>>>>>>>>>>>>>>>>> 09/23/2014  . Parkinsonism ([place]) 08/25/2014  . Constipation 10/15/2013  . Annual physical exam 09/10/2013  . HTN (hypertension) 07/06/2013  . Onychomycosis 05/23/2012  . Open angle primary glaucoma 01/24/2011  . Diabetes mellitus ([place]) 11/04/2009  . Pancytopenia ([place]) 11/04/2009  . PROSTATE CANCER, HX OF 10/05/2008  . COLONIC POLYPS, HX OF 10/05/2008  . HYPERLIPIDEMIA 03/04/2007  . WEIGHT LOSS 08/15/2006   PCP:  Colon Branch, MD Pharmacy:   [id number] [id number]: [phone]: [phone]    Social Determinants of Health (SDOH) Interventions    Readmission Risk Interventions No flowsheet data found.
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
## 2018-11-15 NOTE — ED Notes (Signed)
Pt arrived to Rm [facility] via stretcher. Pt noted to be alert. Dinner tray ordered for pt.

## 2018-11-15 NOTE — Evaluation (Signed)
Physical Therapy Evaluation Patient Details Name: Dylan Orozco MRN: [id number] DOB: 19-Apr-1933 Today's Date: 11/15/2018   History of Present Illness  85 y.o. male admitted on 11/15/18 for multiple falls and inability of family to care for him at this time.  CT is pending and medical and physical workup in progress.  Pt with significant PMH of DM2, trigeminy, prostate CA, PNA, HTN, glaucoma/cataracts, depression, blind in L eye, anxiety, mini shunt insertion, Parkinson's.   Clinical Impression  Pt is weak and requires heavy assistance to get to EOB, stabilize in sitting and stand EOB. He would be safer with AD (rollator) and second person to attempt gait.  Per chart, recent h/o multiple falls and family unable to properly care for him at home.  Pt is appropriate for SNF level rehab at discharge.   PT to follow acutely for deficits listed below.    Follow Up Recommendations SNF    Equipment Recommendations  Wheelchair (measurements PT);Wheelchair cushion (measurements PT);Hospital bed    Recommendations for Other Services OT consult     Precautions / Restrictions Precautions Precautions: Fall Precaution Comments: three falls in recent weeks      Mobility  Bed Mobility Overal bed mobility: Needs Assistance Bed Mobility: Supine to Sit;Sit to Supine     Supine to sit: Mod assist;HOB elevated Sit to supine: Mod assist   General bed mobility comments: Heavy mod assist to come up to sitting, helping to progress bil LEs EOB, extra time needed to initiate movement.  Heavy mod assist to support trunk and lift legs to return to supine.   Transfers Overall transfer level: Needs assistance   Transfers: Sit to/from Stand Sit to Stand: Mod assist         General transfer comment: Heavy mod assist to come to standing EOB, therapist facing pt and pt supporting himself by holding onto PT with both hands.  Stood EOB x 2 with significant support.   Ambulation/Gait              General Gait Details: unable to safely try without second person assisting.          Balance Overall balance assessment: Needs assistance;History of Falls Sitting-balance support: Feet supported;Bilateral upper extremity supported Sitting balance-Leahy Scale: Poor Sitting balance - Comments: posterior preference and min to mod assist EOB, unable to donn his own socks both due to very poor vision and balance.  He reports he uses slippers at home.  Postural control: Posterior lean Standing balance support: Bilateral upper extremity supported Standing balance-Leahy Scale: Poor Standing balance comment: heavy mod assist to stand with flexed trunk posture, feet blocked from sliding forward.                                    Home Living Family/patient expects to be discharged to:: Private residence Living Arrangements: Spouse/significant other(can only provide supervision) Available Help at Discharge: Family Type of Home: House Home Access: Level entry     Home Layout: One level Home Equipment: Cane - single point;Walker - 4 wheels Additional Comments: dtr, Dylan Orozco, checks in on them regularly    Prior Function Level of Independence: Needs assistance   Gait / Transfers Assistance Needed: pt, at baseline ambulates with a rollator, but has had multiple recent falls.            Hand Dominance   Dominant Hand: Right    Extremity/Trunk Assessment   Upper  Extremity Assessment Upper Extremity Assessment: Defer to OT evaluation    Lower Extremity Assessment Lower Extremity Assessment: RLE deficits/detail;LLE deficits/detail RLE Deficits / Details: right leg is weaker than L leg at 2+/5, L leg 3-/5 per bed level and seated gross MMT.   LLE Deficits / Details: left leg is stronger than R leg at 3-/5 strength per gross seated and supine MMT.     Cervical / Trunk Assessment Cervical / Trunk Assessment: Kyphotic(rigid, moves as a unit)  Communication       Cognition Arousal/Alertness: Awake/alert Behavior During Therapy: WFL for tasks assessed/performed Overall Cognitive Status: Impaired/Different from baseline Area of Impairment: Orientation;Attention;Memory;Following commands;Safety/judgement;Awareness                 Orientation Level: Disoriented to;Time;Situation Current Attention Level: Selective   Following Commands: Follows one step commands consistently;Follows one step commands with increased time Safety/Judgement: Decreased awareness of safety;Decreased awareness of deficits Awareness: Intellectual   General Comments: Pt unable to recall falls, reports it is 2001, and doesn't understand why we do not think it is safe for him to return home (he cannot stand without support and keeps falling).        General Comments General comments (skin integrity, edema, etc.): Large, deep scrape on the anterior portion of his right lower leg/tibia        Assessment/Plan    PT Assessment Patient needs continued PT services  PT Problem List Decreased strength;Decreased activity tolerance;Decreased balance;Decreased range of motion;Decreased mobility;Decreased coordination;Decreased cognition;Decreased knowledge of use of DME;Decreased knowledge of precautions;Decreased safety awareness;Decreased skin integrity       PT Treatment Interventions DME instruction;Gait training;Functional mobility training;Therapeutic activities;Therapeutic exercise;Balance training;Neuromuscular re-education;Cognitive remediation;Patient/family education;Wheelchair mobility training    PT Goals (Current goals can be found in the Care Plan section)  Acute Rehab PT Goals Patient Stated Goal: wants to go home today PT Goal Formulation: Patient unable to participate in goal setting Time For Goal Achievement: 12-02-2018 Potential to Achieve Goals: Good    Frequency Min 2X/week   Barriers to discharge Decreased caregiver support family reports they can  no longer manage him at home.        AM-PAC PT "6 Clicks" Mobility  Outcome Measure Help needed turning from your back to your side while in a flat bed without using bedrails?: A Lot Help needed moving from lying on your back to sitting on the side of a flat bed without using bedrails?: A Lot Help needed moving to and from a bed to a chair (including a wheelchair)?: A Lot Help needed standing up from a chair using your arms (e.g., wheelchair or bedside chair)?: A Lot Help needed to walk in hospital room?: Total Help needed climbing 3-5 steps with a railing? : Total 6 Click Score: 10    End of Session   Activity Tolerance: Patient tolerated treatment well Patient left: in bed;with call bell/phone within reach;Other (comment)(with CT transporter)   PT Visit Diagnosis: Muscle weakness (generalized) (M62.81);Difficulty in walking, not elsewhere classified (R26.2)    Time: [id number] PT Time Calculation (min) (ACUTE ONLY): 33 min   Charges:          Wells Guiles B. Johneisha Broaden, PT, DPT  Acute Rehabilitation [phone] pager #[phone] office  @ [facility]: [phone]   PT Evaluation $PT Eval Moderate Complexity: 1 Mod PT Treatments $Therapeutic Activity: 8-22 mins       11/15/2018, 3:43 PM

## 2018-11-16 NOTE — ED Notes (Addendum)
During pt being bathed, pt attempted to hit staff. New condom cath applied to pt by NT.

## 2018-11-16 NOTE — ED Notes (Signed)
Fed pt breakfast °

## 2018-11-16 NOTE — ED Notes (Addendum)
Pt lying on bed - noted to be talking to himself continuously.

## 2018-11-16 NOTE — ED Notes (Addendum)
Daughter at bedside - aware she can be the only visitor for pt. She is encouraging pt to take his meds and be compliant.

## 2018-11-16 NOTE — ED Notes (Signed)
Daughter, [name], leaving at this time. Ensured her phone number is listed under Demographics and would like to be called w/any changes.

## 2018-11-16 NOTE — ED Notes (Addendum)
Pt lying on bed - alert - irritable and delusional. Pt refusing meds. Pt clenched his teeth and attempted to bite straw to cup of water being given.

## 2018-11-16 NOTE — ED Notes (Signed)
Pt being fed lunch by NT.

## 2018-11-16 NOTE — ED Notes (Signed)
PT talking to himself in the room

## 2018-11-16 NOTE — ED Notes (Signed)
Pt fed dinner by EMT/RN.

## 2018-11-17 LAB — BASIC METABOLIC PANEL
Anion gap: 10 (ref 5–15)
BUN: 35 mg/dL — ABNORMAL HIGH (ref 8–23)
CO2: 22 mmol/L (ref 22–32)
Calcium: 9 mg/dL (ref 8.9–10.3)
Chloride: 107 mmol/L (ref 98–111)
Creatinine, Ser: 1.45 mg/dL — ABNORMAL HIGH (ref 0.61–1.24)
GFR calc Af Amer: 51 mL/min — ABNORMAL LOW (ref >60)
GFR calc non Af Amer: 44 mL/min — ABNORMAL LOW (ref >60)
Glucose, Bld: 148 mg/dL — ABNORMAL HIGH (ref 70–99)
Potassium: 4.1 mmol/L (ref 3.5–5.1)
Sodium: 139 mmol/L (ref 135–145)

## 2018-11-17 LAB — CBG MONITORING, ED: Glucose-Capillary: 148 mg/dL — ABNORMAL HIGH (ref 70–99)

## 2018-11-17 LAB — SARS CORONAVIRUS 2 (TAT 6-24 HRS): SARS Coronavirus 2: NEGATIVE

## 2018-11-17 NOTE — NC FL2 (Addendum)
[place] MEDICAID FL2 LEVEL OF CARE SCREENING TOOL     IDENTIFICATION  Patient Name: Dylan Orozco Birthdate: Sep 13, 1933 Sex: male Admission Date (Current Location): 11/15/2018  [facility] and [place] Number:  Herbalist and Address:  [facility] [place] [place] [address], [place], [place] [address]      Provider Number: [id number]  Attending Physician Name and Address:  Default, Provider, MD  Relative Name and Phone Number:  Nyko Gell, wife, [phone]    Current Level of Care: Hospital Recommended Level of Care: [place] Prior Approval Number:    Date Approved/Denied:   PASRR Number: Pending  Discharge Plan: SNF    Current Diagnoses: Patient Active Problem List   Diagnosis Date Noted  . Major neurocognitive disorder due to another medical condition without behavioral disturbance ([place]) 11/08/2018  . Pemphigus 01/26/2018  . Dysarthria 01/01/2018  . Altered mental status 01/01/2018  . Bullous pemphigoid 01/01/2018  . Atrial fibrillation with RVR ([place]) 12/13/2017  . Thrombocytopenia ([place]) 12/13/2017  . Normochromic normocytic anemia 12/13/2017  . Ectatic abdominal aorta ([place]) 09/23/2017  . Trigeminy 08/21/2017  . Anxiety 07/02/2017  . PCP NOTES >>>>>>>>>>>>>>>>>>>>>>>>>>>>>>>> 09/23/2014  . Parkinsonism ([place]) 08/25/2014  . Constipation 10/15/2013  . Annual physical exam 09/10/2013  . HTN (hypertension) 07/06/2013  . Onychomycosis 05/23/2012  . Open angle primary glaucoma 01/24/2011  . Diabetes mellitus ([place]) 11/04/2009  . Pancytopenia ([place]) 11/04/2009  . PROSTATE CANCER, HX OF 10/05/2008  . COLONIC POLYPS, HX OF 10/05/2008  . HYPERLIPIDEMIA 03/04/2007  . WEIGHT LOSS 08/15/2006    Orientation RESPIRATION BLADDER Height & Weight     Situation, Place, Self, Time  Normal   Weight: 144 lb (65.3 kg) Height:  '5\' 9"'  (175.3 cm)  BEHAVIORAL SYMPTOMS/MOOD NEUROLOGICAL BOWEL NUTRITION STATUS  Other (Comment)(History  of dementia related bizarre/agitated behaviors)        AMBULATORY STATUS COMMUNICATION OF NEEDS Skin   Limited Assist Verbally Normal                       Personal Care Assistance Level of Assistance  Bathing, Feeding, Dressing Bathing Assistance: Maximum assistance Feeding assistance: Limited assistance Dressing Assistance: Limited assistance     Functional Limitations Info  Hearing   Hearing Info: Impaired(HOH, no device)      SPECIAL CARE FACTORS FREQUENCY  PT (By licensed PT), OT (By licensed OT)     PT Frequency: 5x weekly OT Frequency: 5xweekly            Contractures Contractures Info: Not present    Additional Factors Info  Code Status Code Status Info: Prior             Current Medications (11/17/2018):  This is the current hospital active medication list Current Facility-Administered Medications  Medication Dose Route Frequency Provider Last Rate Last Dose  . acarbose (PRECOSE) tablet 50 mg  50 mg Oral TID WC F[place], Wylder S, PA   50 mg at 11/17/18 1309  . acetaminophen (TYLENOL) tablet 650 mg  650 mg Oral Q6H PRN Varney Biles, MD   650 mg at 11/15/18 2141  . apixaban (ELIQUIS) tablet 2.5 mg  2.5 mg Oral BID Pati Gallo S, PA   2.5 mg at 11/17/18 1309  . atorvastatin (LIPITOR) tablet 20 mg  20 mg Oral Daily Pati Gallo S, PA   20 mg at 11/17/18 1308  . carbidopa-levodopa (SINEMET IR) 25-100 MG per tablet immediate release 1.5 tablet  1.5 tablet  Oral TID Pati Gallo S, PA   1.5 tablet at 11/17/18 1310  . cholecalciferol (VITAMIN D3) tablet 1,000 Units  1,000 Units Oral Daily Pati Gallo S, PA   1,000 Units at 11/17/18 1309  . dorzolamide-timolol (COSOPT) 22.3-6.8 MG/ML ophthalmic solution 1 drop  1 drop Both Eyes BID Pati Gallo S, [place]   1 drop at 11/17/18 0859  . glimepiride (AMARYL) tablet 6 mg  6 mg Oral Q breakfast Pati Gallo S, PA   6 mg at 11/17/18 0859  . latanoprost (XALATAN) 0.005 % ophthalmic solution 1 drop  1 drop  Right Eye QHS Pati Gallo S, [place]   1 drop at 11/16/18 2152  . LORazepam (ATIVAN) tablet 0.5 mg  0.5 mg Oral Q6H PRN Pati Gallo S, PA       Current Outpatient Medications  Medication Sig Dispense Refill  . acarbose (PRECOSE) 50 MG tablet Take 1 tablet (50 mg total) by mouth 3 (three) times daily with meals. 270 tablet 1  . acetaminophen (TYLENOL) 325 MG tablet Take 2 tablets (650 mg total) by mouth every 6 (six) hours as needed for mild pain, fever or headache.    Marland Kitchen amiodarone (PACERONE) 200 MG tablet Take 1 tablet (200 mg total) by mouth 2 (two) times daily. 180 tablet 1  . apixaban (ELIQUIS) 5 MG TABS tablet Take 1 tablet (5 mg total) by mouth 2 (two) times daily. 180 tablet 1  . atorvastatin (LIPITOR) 20 MG tablet Take 1 tablet (20 mg total) by mouth daily. 90 tablet 1  . blood glucose meter kit and supplies Check blood sugars once daily. 1 each 0  . Blood Glucose Monitoring Suppl (ONETOUCH VERIO FLEX SYSTEM) w/Device KIT Check blood sugar once daily 1 kit 0  . brimonidine (ALPHAGAN P) 0.1 % SOLN Place 1 drop into both eyes 2 (two) times daily.    . carbidopa-levodopa (SINEMET IR) 25-100 MG tablet Take 1.5 tablets by mouth 3 (three) times daily. 7am, noon, 5pm 405 tablet 3  . cholecalciferol (VITAMIN D3) 25 MCG (1000 UT) tablet Take 1,000 Units by mouth daily.    . dorzolamide-timolol (COSOPT) 22.3-6.8 MG/ML ophthalmic solution Place 1 drop into both eyes 2 (two) times daily.  11  . glimepiride (AMARYL) 2 MG tablet Take 3 tablets (6 mg total) by mouth daily with breakfast. 270 tablet 1  . glucose blood (ONETOUCH VERIO) test strip Check blood sugar once daily 30 each 0  . hydroxypropyl methylcellulose / hypromellose (ISOPTO TEARS / GONIOVISC) 2.5 % ophthalmic solution Place 1 drop into both eyes 3 (three) times daily as needed for dry eyes.    . hydrOXYzine (ATARAX/VISTARIL) 10 MG tablet Take 10 mg by mouth 3 (three) times daily as needed for itching.     . latanoprost (XALATAN) 0.005 %  ophthalmic solution Place 1 drop into the right eye at bedtime.     Marland Kitchen LORazepam (ATIVAN) 0.5 MG tablet Take 1 tablet (0.5 mg total) by mouth every 6 (six) hours as needed for anxiety or sedation. 40 tablet 0  . Multiple Vitamin (MULTIVITAMIN WITH MINERALS) TABS Take 1 tablet by mouth daily.    . ONE TOUCH LANCETS MISC Check blood sugar once daily 30 each 0     Discharge Medications: Please see discharge summary for a list of discharge medications.  Relevant Imaging Results:  Relevant Lab Results:   Additional Information  SSN: [id number]  Archie Endo, LCSW

## 2018-11-17 NOTE — Discharge Planning (Addendum)
Clinical Social Work is seeking post-discharge placement for this patient at the following level of care: SNF.    

## 2018-11-17 NOTE — Progress Notes (Signed)
2nd shift CSW uploaded necessary documents (H&P, FL2 and 30day STR note) to [facility] as apart of PASRR screening. TOC team will continue to follow patient for any discharge related needs.  [place] Transitions of Care  Clinical Social Worker  Ph: [phone]

## 2018-11-17 NOTE — ED Notes (Signed)
Pt has been resting with eyes closed all day but is arousable and oriented to self immediately upon assessment. Initially appeared to be too sleepy to eat but upon encouragement was able to take a few bites of food before saying he was no longer hungry. CBG 148

## 2018-11-17 NOTE — ED Notes (Signed)
PA at bedside.

## 2018-11-17 NOTE — ED Notes (Signed)
Pt is resting comfortably at this time, will give PO meds when pt wakes for lunch

## 2018-11-17 NOTE — Progress Notes (Signed)
OT Cancellation Note  Patient Details Name: Dylan Orozco MRN: [id number] DOB: 1933-02-10   Cancelled Treatment:    Reason Eval/Treat Not Completed: Pt for SNF at discharge.  OT eval does not appear to be necessary for pre approval, therefore, will defer OT to SNF.  IF OT becomes necessary to qualify him for SNF, please contact OT at [phone].  Thank you,  Lucille Passy, OTR/L [place] Pager [phone] Office [phone]   Lucille Passy M 11/17/2018, 10:22 AM

## 2018-11-17 NOTE — Evaluation (Signed)
Occupational Therapy Evaluation Patient Details Name: Dylan Orozco MRN: [id number] DOB: December 16, 1933 Today's Date: 11/17/2018    History of Present Illness 85 y.o. male admitted on 11/15/18 for multiple falls and inability of family to care for him at this time.  CT is pending and medical and physical workup in progress.  Pt with significant PMH of DM2, trigeminy, prostate CA, PNA, HTN, glaucoma/cataracts, depression, blind in L eye, anxiety, mini shunt insertion, Parkinson's.    Clinical Impression   Pt admitted with the above diagnosis and presents to OT with the below listed deficits.  OT evaluation limited due to pt lethargy.  He currently requires total A for ADLs.  Per chart review, he lived with spouse who can only provide supervision level assist.  Pt ambulated with Rollator, but has h/o several falls.  Recommend SNF level rehab to allow pt to maximize safety and independence with ADLs, reduce risk of falls, injury, and readmission.  All further OT needs can be addressed at SNF.  Acute OT will sign off at this time.     Follow Up Recommendations  SNF    Equipment Recommendations  None recommended by OT    Recommendations for Other Services       Precautions / Restrictions Precautions Precautions: Fall Precaution Comments: three falls in recent weeks      Mobility Bed Mobility Overal bed mobility: Needs Assistance Bed Mobility: Supine to Sit;Sit to Supine     Supine to sit: HOB elevated;Total assist Sit to supine: Total assist   General bed mobility comments: requires assist for all aspects due to lethargy   Transfers                 General transfer comment: unable to attempt due to lethargy     Balance Overall balance assessment: Needs assistance;History of Falls Sitting-balance support: Feet supported;Bilateral upper extremity supported Sitting balance-Leahy Scale: Poor Sitting balance - Comments: Pt with heavy Rt lateral and posterior lean.  He  requires max A to sit EOB  Postural control: Posterior lean;Right lateral lean Standing balance support: Single extremity supported   Standing balance comment: unable to safely attempt                            ADL either performed or assessed with clinical judgement   ADL Overall ADL's : Needs assistance/impaired Eating/Feeding: Bed level;Total assistance   Grooming: Wash/dry hands;Wash/dry face;Oral care;Brushing hair;Total assistance;Bed level   Upper Body Bathing: Total assistance;Bed level;Sitting   Lower Body Bathing: Total assistance;Bed level   Upper Body Dressing : Total assistance;Bed level   Lower Body Dressing: Total assistance;Bed level   Toilet Transfer: Total assistance Toilet Transfer Details (indicate cue type and reason): unable to attempt  Toileting- Clothing Manipulation and Hygiene: Total assistance;Bed level       Functional mobility during ADLs: Total assistance General ADL Comments: limited due to lethargy      Vision   Additional Comments: unable to assess due to lethargy - keeps eyes closed majority of the time      Perception Perception Perception Tested?: No   Praxis Praxis Praxis tested?: Not tested    Pertinent Vitals/Pain Pain Assessment: Faces Faces Pain Scale: No hurt     Hand Dominance Right   Extremity/Trunk Assessment Upper Extremity Assessment Upper Extremity Assessment: RUE deficits/detail;LUE deficits/detail RUE Deficits / Details: rigidity noted bil. with bil. tremors noted LUE Deficits / Details: rigidity noted bil. with bil. tremors  noted   Lower Extremity Assessment Lower Extremity Assessment: Defer to PT evaluation   Cervical / Trunk Assessment Cervical / Trunk Assessment: Kyphotic(rigidity noted.  unable to disassociate upper and lower trun)   Communication Communication Communication: HOH   Cognition Arousal/Alertness: Lethargic Behavior During Therapy: Flat affect Overall Cognitive Status:  Impaired/Different from baseline                   Orientation Level: Disoriented to;Time;Situation;Person;Place             General Comments: Pt lethargic throughout session.  He will open eyes intermittently when moved to EOB and with max stimuli, but fades back off to sleep.  He is unsure of his name, and followed no commands    General Comments  RN present     Exercises     Shoulder Instructions      Home Living Family/patient expects to be discharged to:: Private residence Living Arrangements: Spouse/significant other(can only provide supervision) Available Help at Discharge: Family Type of Home: House Home Access: Level entry     Home Layout: One level     Bathroom Shower/Tub: Occupational psychologist: [place] - single point;Walker - 4 wheels   Additional Comments: info gleaned from chart review as pt unable to provide info and no family present.   dtr, Monique, checks in on them regularly      Prior Functioning/Environment Level of Independence: Needs assistance  Gait / Transfers Assistance Needed: pt, at baseline ambulates with a rollator, but has had multiple recent falls.  ADL's / Homemaking Assistance Needed: pt unable to provide info and no family present             OT Problem List: Decreased strength;Decreased activity tolerance;Decreased range of motion;Impaired balance (sitting and/or standing);Decreased cognition;Decreased coordination;Decreased safety awareness;Decreased knowledge of use of DME or AE;Impaired tone;Impaired UE functional use      OT Treatment/Interventions:      OT Goals(Current goals can be found in the care plan section) Acute Rehab OT Goals Patient Stated Goal: pt unable  OT Goal Formulation: Patient unable to participate in goal setting  OT Frequency:     Barriers to D/C:            Co-evaluation              AM-PAC OT "6 Clicks" Daily Activity     Outcome Measure  Help from another person eating meals?: Total Help from another person taking care of personal grooming?: Total Help from another person toileting, which includes using toliet, bedpan, or urinal?: Total Help from another person bathing (including washing, rinsing, drying)?: Total Help from another person to put on and taking off regular upper body clothing?: Total Help from another person to put on and taking off regular lower body clothing?: Total 6 Click Score: 6   End of Session Nurse Communication: Mobility status  Activity Tolerance: Patient limited by lethargy Patient left: in bed;with call bell/phone within reach;with bed alarm set  OT Visit Diagnosis: Unsteadiness on feet (R26.81);Cognitive communication deficit (R41.841)                Time: [id number] OT Time Calculation (min): 16 min Charges:  OT General Charges $OT Visit: 1 Visit OT Evaluation $OT Eval Moderate Complexity: 1 Mod  Lucille Passy, OTR/L Acute Rehabilitation Services Pager [phone] Office [phone]   Lucille Passy M 11/17/2018, 1:33 PM

## 2018-11-17 NOTE — ED Notes (Signed)
This nurse reached out to PA r/t pt drowsiness. Reports will be on unit to assess pt.

## 2018-11-17 NOTE — ED Notes (Signed)
NT at bedside assisting pt with dinner tray

## 2018-11-17 NOTE — ED Notes (Addendum)
RN at attempted lab draw; will have 2nd RN to get lab-Monique,RN

## 2018-11-17 NOTE — ED Notes (Signed)
Dr.Teglar at bedside to evaluate patient cough and lung sounds; No new orders at this time; RN to continue to monitor need for Chest xray-Monique,RN

## 2018-11-17 NOTE — Progress Notes (Addendum)
12:00pm: CSW receieved return call from patient's wife Dylan Orozco stating she wanted to accept the bed offer at [facility] due to it's close proximity to the patient's home. CSW notified Chantel at [facility] to inform her so that insurance authorization can be obtained.  10:30am: CSW spoke with patient's wife Dylan Orozco to inform her of the two bed offers available thus far from [facility] and [facility]. Dylan Orozco stated she was going to drive by the two facilities to check them out and would return call to [place].  Madilyn Fireman, MSW, LCSW-A Transitions of Care  Clinical Social Worker  [facility] Emergency Departments  Medical ICU [phone]

## 2018-11-18 NOTE — Progress Notes (Signed)
Pt declined PT as well as refusing to eat his meal.  Will try again at another time.   11/18/18 1500  PT Visit Information  Last PT Received On 11/18/18  Reason Eval/Treat Not Completed Other (comment)    Mee Hives, PT MS Acute Rehab Dept. Number: [place]

## 2018-11-18 NOTE — ED Notes (Signed)
Pt refusing po meds at this time - will re-attempt later.

## 2018-11-18 NOTE — ED Notes (Signed)
Turned pt to left side and placed pillow between his legs for comfort.

## 2018-11-18 NOTE — ED Notes (Signed)
Pt being fed dinner.  

## 2018-11-18 NOTE — Progress Notes (Signed)
Patient will be discharged to [facility] to room 203 once PASSR is obtained. The number to call for report is [phone]. Please fax patient's AVS to [phone].   CSW spoke with patient's wife Deborra Medina to inform her of discharge. Deborra Medina to meet with Chantel at [place] to complete paperwork at 1pm today.  CSW spoke with Danae Chen at [facility] to request this patient's review be expedited as it is needed for admission to the facility, the PASSR is currently under review but no additional updates were given.  Madilyn Fireman, MSW, LCSW-A Transitions of Care  Clinical Social Worker  [facility] Emergency Departments  Medical ICU [phone]

## 2018-11-18 NOTE — ED Notes (Signed)
Pt refused to eat lunch even after much encouragement from NT and RN. Pt refused to work w/PT.

## 2018-11-18 NOTE — ED Notes (Signed)
Advised pt his daughter, [name], called and requested for this RN to advise him she loves him and will not be coming to ED at this time d/t she does not want to get sick and have to be hospitalized again herself but wants to make sure he knows she is a phone call away.

## 2018-11-19 ENCOUNTER — Emergency Department (HOSPITAL_COMMUNITY): Payer: Medicare Other

## 2018-11-19 ENCOUNTER — Observation Stay (HOSPITAL_COMMUNITY): Payer: Medicare Other

## 2018-11-19 DIAGNOSIS — E119 Type 2 diabetes mellitus without complications: Secondary | ICD-10-CM | POA: Diagnosis not present

## 2018-11-19 DIAGNOSIS — G20 Parkinson's disease: Secondary | ICD-10-CM

## 2018-11-19 DIAGNOSIS — J690 Pneumonitis due to inhalation of food and vomit: Secondary | ICD-10-CM

## 2018-11-19 DIAGNOSIS — E782 Mixed hyperlipidemia: Secondary | ICD-10-CM

## 2018-11-19 DIAGNOSIS — Z8546 Personal history of malignant neoplasm of prostate: Secondary | ICD-10-CM

## 2018-11-19 DIAGNOSIS — G9341 Metabolic encephalopathy: Secondary | ICD-10-CM

## 2018-11-19 LAB — BASIC METABOLIC PANEL
Anion gap: 13 (ref 5–15)
BUN: 64 mg/dL — ABNORMAL HIGH (ref 8–23)
CO2: 20 mmol/L — ABNORMAL LOW (ref 22–32)
Calcium: 9 mg/dL (ref 8.9–10.3)
Chloride: 105 mmol/L (ref 98–111)
Creatinine, Ser: 2 mg/dL — ABNORMAL HIGH (ref 0.61–1.24)
GFR calc Af Amer: 35 mL/min — ABNORMAL LOW (ref >60)
GFR calc non Af Amer: 30 mL/min — ABNORMAL LOW (ref >60)
Glucose, Bld: 208 mg/dL — ABNORMAL HIGH (ref 70–99)
Potassium: 5 mmol/L (ref 3.5–5.1)
Sodium: 138 mmol/L (ref 135–145)

## 2018-11-19 LAB — CBC WITH DIFFERENTIAL/PLATELET
Abs Immature Granulocytes: 0.1 10*3/uL — ABNORMAL HIGH (ref 0.00–0.07)
Basophils Absolute: 0 10*3/uL (ref 0.0–0.1)
Basophils Relative: 0 %
Eosinophils Absolute: 0 10*3/uL (ref 0.0–0.5)
Eosinophils Relative: 0 %
HCT: 43.4 % (ref 39.0–52.0)
Hemoglobin: 13.3 g/dL (ref 13.0–17.0)
Immature Granulocytes: 1 %
Lymphocytes Relative: 5 %
Lymphs Abs: 0.5 10*3/uL — ABNORMAL LOW (ref 0.7–4.0)
MCH: 28.3 pg (ref 26.0–34.0)
MCHC: 30.6 g/dL (ref 30.0–36.0)
MCV: 92.3 fL (ref 80.0–100.0)
Monocytes Absolute: 0.9 10*3/uL (ref 0.1–1.0)
Monocytes Relative: 9 %
Neutro Abs: 7.9 10*3/uL — ABNORMAL HIGH (ref 1.7–7.7)
Neutrophils Relative %: 85 %
Platelets: 137 10*3/uL — ABNORMAL LOW (ref 150–400)
RBC: 4.7 MIL/uL (ref 4.22–5.81)
RDW: 14.4 % (ref 11.5–15.5)
WBC: 9.4 10*3/uL (ref 4.0–10.5)
nRBC: 0 % (ref 0.0–0.2)

## 2018-11-19 LAB — CBG MONITORING, ED
Glucose-Capillary: 171 mg/dL — ABNORMAL HIGH (ref 70–99)
Glucose-Capillary: 159 mg/dL — ABNORMAL HIGH (ref 70–99)

## 2018-11-19 LAB — LACTIC ACID, PLASMA: Lactic Acid, Venous: 1.9 mmol/L (ref 0.5–1.9)

## 2018-11-19 MED ORDER — ALBUTEROL SULFATE (2.5 MG/3ML) 0.083% IN NEBU: 2.5000 mg | INHALATION_SOLUTION | RESPIRATORY_TRACT | Status: DC | PRN

## 2018-11-19 MED ORDER — SODIUM CHLORIDE 0.9 % IV SOLN
INTRAVENOUS | Status: DC
  Administered 2018-11-19 – 2018-11-20 (×2): via INTRAVENOUS

## 2018-11-19 MED ORDER — INSULIN ASPART 100 UNIT/ML ~~LOC~~ SOLN
0.0000 [IU] | SUBCUTANEOUS | Status: DC
  Administered 2018-11-19 (×2): 2 [IU] via SUBCUTANEOUS
  Administered 2018-11-21: 1 [IU] via SUBCUTANEOUS
  Administered 2018-11-22: 2 [IU] via SUBCUTANEOUS
  Administered 2018-11-23 (×2): 1 [IU] via SUBCUTANEOUS
  Administered 2018-11-23: 2 [IU] via SUBCUTANEOUS

## 2018-11-19 MED ORDER — ONDANSETRON HCL 4 MG PO TABS: 4.0000 mg | ORAL_TABLET | Freq: Four times a day (QID) | ORAL | Status: DC | PRN

## 2018-11-19 MED ORDER — ACETAMINOPHEN 325 MG PO TABS
650.0000 mg | ORAL_TABLET | Freq: Four times a day (QID) | ORAL | Status: DC | PRN
  Administered 2018-11-22 – 2018-11-23 (×2): 650 mg via ORAL
  Filled 2018-11-19 (×2): qty 2

## 2018-11-19 MED ORDER — ONDANSETRON HCL 4 MG/2ML IJ SOLN: 4.0000 mg | Freq: Four times a day (QID) | INTRAMUSCULAR | Status: DC | PRN

## 2018-11-19 MED ORDER — SODIUM CHLORIDE 0.9 % IV BOLUS
1000.0000 mL | Freq: Once | INTRAVENOUS | Status: AC
  Administered 2018-11-19: 15:00:00 1000 mL via INTRAVENOUS

## 2018-11-19 MED ORDER — ACETAMINOPHEN 650 MG RE SUPP: 650.0000 mg | Freq: Four times a day (QID) | RECTAL | Status: DC | PRN

## 2018-11-19 MED ORDER — SODIUM CHLORIDE 0.9 % IV SOLN
1.0000 g | Freq: Once | INTRAVENOUS | Status: AC
  Administered 2018-11-19: 1 g via INTRAVENOUS
  Filled 2018-11-19: qty 10

## 2018-11-19 MED ORDER — SENNOSIDES-DOCUSATE SODIUM 8.6-50 MG PO TABS
1.0000 | ORAL_TABLET | Freq: Every evening | ORAL | Status: DC | PRN
  Administered 2018-11-22 – 2018-11-23 (×2): 1 via ORAL
  Filled 2018-11-19 (×2): qty 1

## 2018-11-19 MED ORDER — SODIUM CHLORIDE 0.9 % IV SOLN
3.0000 g | Freq: Two times a day (BID) | INTRAVENOUS | Status: DC
  Administered 2018-11-19 – 2018-11-20 (×2): 3 g via INTRAVENOUS
  Filled 2018-11-19: qty 3
  Filled 2018-11-19 (×5): qty 8

## 2018-11-19 NOTE — ED Notes (Signed)
Phlebotomy at bedside.

## 2018-11-19 NOTE — ED Notes (Signed)
Returned from CT.

## 2018-11-19 NOTE — Progress Notes (Addendum)
2pm: CSW present on unit to speak with RN prior to call with PASSR, RN informed CSW that the patient is not able to verbally communicate at this time. CSW notified Pamala Hurry at [facility] that the screening can not occur at this time.  Dr. Maryan Rued to order new labs for this patient, she will update the patient's wife when new information becomes available.  12pm: CSW spoke with Zack Seal at [facility] who states she will complete the level 2 screening at 1:45pm today via phone, CSW will facilitate call.  Madilyn Fireman, MSW, LCSW-A Transitions of Care  Clinical Social Worker  [facility] Emergency Departments  Medical ICU [phone]

## 2018-11-19 NOTE — ED Notes (Signed)
Per patient's chart, history of renal stones noted and with decline in renal function this RN made Dr. Horris Latino aware of significant amount of sediment in patient's urine and amount of urine in bladder on bladder scan.

## 2018-11-19 NOTE — ED Notes (Signed)
Blood draw three attempts with no success
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
## 2018-11-19 NOTE — ED Notes (Signed)
Admitting at bedside 

## 2018-11-19 NOTE — ED Notes (Signed)
Heart Healthy Diet was ordered for Lunch. 

## 2018-11-19 NOTE — ED Notes (Signed)
Patient transported to CT 
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
## 2018-11-19 NOTE — ED Notes (Signed)
Dr. Plunkett at bedside.  

## 2018-11-19 NOTE — Progress Notes (Addendum)
Pharmacy Antibiotic Note  Dylan Orozco is a 85 y.o. male with PMH of parkinson's, Afib on anticoagulation, HTN, DM, HLD, prostate cancer, glaucoma, dementia, and recent frequent fall. Patient's weakness and dementia are noted to be worsening overall with dysphasia and chocking on own secretions. Pharmacy has been consulted for Unasyn dosing due to suspicion for aspiration pneumonia.   Pt is febrile (Tmax 100.9), hypotensive (109/63), with an elevated lactate ~ 1.9.  Plan: Unasyn IV 3g q12 hours Dose adjust as needed since patient is in AKI. Monitor renal function, clinical status, WBC, and temp   Height: 5\' 9"  (175.3 cm) Weight: 144 lb (65.3 kg) IBW/kg (Calculated) : 70.7  Temp (24hrs), Avg:99.1 F (37.3 C), Min:97.7 F (36.5 C), Max:100.9 F (38.3 C)  Recent Labs  Lab 11/15/18 1305 11/17/18 0816 11/19/18 1400  WBC 6.2  --   --   CREATININE 1.64* 1.45* 2.00*  LATICACIDVEN  --   --  1.9    Estimated Creatinine Clearance: 25.8 mL/min (A) (by C-G formula based on SCr of 2 mg/dL (H)).    Allergies  Allergen Reactions  . Tradjenta [Linagliptin]     rash  . Pravastatin     Constipation  . Ramipril      dizziness    Antimicrobials this admission: Ceftriaxone 10/28 X 1 Unasyn 10/28 >>   Microbiology results: 10/28 BCx: Sent 10/28 UCx: Sent 10/26 COVID: negative   Thank you for allowing pharmacy to be a part of this patient's care.  Sherren Kerns, PharmD PGY1 Acute Care Pharmacy Resident 11/19/2018 6:03 PM
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
## 2018-11-19 NOTE — Progress Notes (Signed)
Pt was assisted to the chair with two students of nursing supporting the effort by PT.  He is keeping eyes closed but is willing to be up.  Positioned arms with pillows and talked with nursing about sliding him on the sheet under him back to a flat bed.  Follow as pt will allow.  11/19/18 1100  PT Visit Information  Last PT Received On 11/19/18  Assistance Needed +2  History of Present Illness 85 y.o. male admitted on 11/15/18 for multiple falls and inability of family to care for him at this time.  CT is pending and medical and physical workup in progress.  Pt with significant PMH of DM2, trigeminy, prostate CA, PNA, HTN, glaucoma/cataracts, depression, blind in L eye, anxiety, mini shunt insertion, Parkinson's.   Subjective Data  Subjective no verbalizations mainly nodding  Patient Stated Goal none  Precautions  Precautions Fall  Precaution Comments three falls in recent weeks  Restrictions  Weight Bearing Restrictions No  Pain Assessment  Pain Assessment Faces  Faces Pain Scale 0  Cognition  Arousal/Alertness Lethargic  Behavior During Therapy Flat affect  Overall Cognitive Status Impaired/Different from baseline  Area of Impairment Problem solving;Awareness;Safety/judgement;Following commands;Attention  Orientation Level Situation  Current Attention Level Selective  Following Commands Follows one step commands inconsistently;Follows one step commands with increased time  Safety/Judgement Decreased awareness of safety;Decreased awareness of deficits  Awareness Intellectual  Problem Solving Slow processing;Requires verbal cues;Requires tactile cues;Decreased initiation  General Comments lethargic with PT but pushed off on LUE in sitting.  Followed with leaning on L elbow to counterbalance for only a short time,  Bed Mobility  Overal bed mobility Needs Assistance  Bed Mobility Supine to Sit  Supine to sit [facility] elevated;Max assist  Transfers  Overall transfer level Needs assistance   Equipment used 1 person hand held assist;2 person hand held assist  Transfers Sit to/from Stand;Stand Pivot Transfers  Sit to Stand Max assist  Stand pivot transfers Total assist  General transfer comment pt was lethargic entirely but allowed PT to move with other staff to assist standing by  Ambulation/Gait  General Gait Details not able to attempt  Balance  Overall balance assessment Needs assistance;History of Falls  Sitting-balance support Feet supported;Bilateral upper extremity supported  Sitting balance-Leahy Scale Poor  Sitting balance - Comments pushing to lean R and with help can support on L elbow briefly  Postural control Posterior lean;Right lateral lean  Standing balance support Single extremity supported  Standing balance comment poor but supports a little on LE's  General Comments  General comments (skin integrity, edema, etc.) nursing students assisting  PT - End of Session  Activity Tolerance Patient tolerated treatment well  Patient left with call bell/phone within reach;in chair;with nursing/sitter in room  Nurse Communication Mobility status   PT - Assessment/Plan  PT Plan Current plan remains appropriate  PT Visit Diagnosis Muscle weakness (generalized) (M62.81);Difficulty in walking, not elsewhere classified (R26.2)  PT Frequency (ACUTE ONLY) Min 2X/week  Recommendations for Other Services OT consult  Follow Up Recommendations SNF  PT equipment Wheelchair (measurements PT);Wheelchair cushion (measurements PT);Hospital bed  AM-PAC PT "6 Clicks" Mobility Outcome Measure (Version 2)  Help needed turning from your back to your side while in a flat bed without using bedrails? 2  Help needed moving from lying on your back to sitting on the side of a flat bed without using bedrails? 2  Help needed moving to and from a bed to a chair (including a wheelchair)? 1  Help needed standing up from a chair using your arms (e.g., wheelchair or bedside chair)? 1  Help needed  to walk in hospital room? 1  Help needed climbing 3-5 steps with a railing?  1  6 Click Score 8  Consider Recommendation of Discharge To: CIR/SNF/LTACH  Acute Rehab PT Goals  PT Goal Formulation Patient unable to participate in goal setting  PT Time Calculation  PT Start Time (ACUTE ONLY) 1020  PT Stop Time (ACUTE ONLY) 1038  PT Time Calculation (min) (ACUTE ONLY) 18 min  PT General Charges  $$ ACUTE PT VISIT 1 Visit  PT Treatments  $Therapeutic Activity 8-22 mins    Mee Hives, PT MS Acute Rehab Dept. Number: [place] [facility] [place]

## 2018-11-19 NOTE — ED Notes (Signed)
Patient transported to MRI 

## 2018-11-20 ENCOUNTER — Encounter (HOSPITAL_COMMUNITY): Payer: Self-pay | Admitting: Primary Care

## 2018-11-20 DIAGNOSIS — R64 Cachexia: Secondary | ICD-10-CM | POA: Diagnosis present

## 2018-11-20 DIAGNOSIS — Z515 Encounter for palliative care: Secondary | ICD-10-CM | POA: Diagnosis not present

## 2018-11-20 DIAGNOSIS — R6520 Severe sepsis without septic shock: Secondary | ICD-10-CM | POA: Diagnosis present

## 2018-11-20 DIAGNOSIS — N1831 Chronic kidney disease, stage 3a: Secondary | ICD-10-CM | POA: Diagnosis present

## 2018-11-20 DIAGNOSIS — Z7189 Other specified counseling: Secondary | ICD-10-CM | POA: Diagnosis not present

## 2018-11-20 DIAGNOSIS — L89303 Pressure ulcer of unspecified buttock, stage 3: Secondary | ICD-10-CM | POA: Diagnosis not present

## 2018-11-20 DIAGNOSIS — G20 Parkinson's disease: Secondary | ICD-10-CM | POA: Diagnosis present

## 2018-11-20 DIAGNOSIS — L89611 Pressure ulcer of right heel, stage 1: Secondary | ICD-10-CM | POA: Diagnosis present

## 2018-11-20 DIAGNOSIS — L89153 Pressure ulcer of sacral region, stage 3: Secondary | ICD-10-CM | POA: Diagnosis present

## 2018-11-20 DIAGNOSIS — R531 Weakness: Secondary | ICD-10-CM | POA: Diagnosis present

## 2018-11-20 DIAGNOSIS — Z66 Do not resuscitate: Secondary | ICD-10-CM | POA: Diagnosis not present

## 2018-11-20 DIAGNOSIS — J690 Pneumonitis due to inhalation of food and vomit: Secondary | ICD-10-CM | POA: Diagnosis present

## 2018-11-20 DIAGNOSIS — W19XXXA Unspecified fall, initial encounter: Secondary | ICD-10-CM

## 2018-11-20 DIAGNOSIS — R627 Adult failure to thrive: Secondary | ICD-10-CM | POA: Diagnosis present

## 2018-11-20 DIAGNOSIS — I129 Hypertensive chronic kidney disease with stage 1 through stage 4 chronic kidney disease, or unspecified chronic kidney disease: Secondary | ICD-10-CM | POA: Diagnosis present

## 2018-11-20 DIAGNOSIS — I452 Bifascicular block: Secondary | ICD-10-CM | POA: Diagnosis present

## 2018-11-20 DIAGNOSIS — Z20828 Contact with and (suspected) exposure to other viral communicable diseases: Secondary | ICD-10-CM | POA: Diagnosis present

## 2018-11-20 DIAGNOSIS — H401190 Primary open-angle glaucoma, unspecified eye, stage unspecified: Secondary | ICD-10-CM | POA: Diagnosis present

## 2018-11-20 DIAGNOSIS — E870 Hyperosmolality and hypernatremia: Secondary | ICD-10-CM | POA: Diagnosis not present

## 2018-11-20 DIAGNOSIS — N179 Acute kidney failure, unspecified: Secondary | ICD-10-CM | POA: Diagnosis present

## 2018-11-20 DIAGNOSIS — F0280 Dementia in other diseases classified elsewhere without behavioral disturbance: Secondary | ICD-10-CM | POA: Diagnosis present

## 2018-11-20 DIAGNOSIS — E119 Type 2 diabetes mellitus without complications: Secondary | ICD-10-CM | POA: Diagnosis not present

## 2018-11-20 DIAGNOSIS — E1122 Type 2 diabetes mellitus with diabetic chronic kidney disease: Secondary | ICD-10-CM | POA: Diagnosis present

## 2018-11-20 DIAGNOSIS — L89621 Pressure ulcer of left heel, stage 1: Secondary | ICD-10-CM | POA: Diagnosis present

## 2018-11-20 DIAGNOSIS — E785 Hyperlipidemia, unspecified: Secondary | ICD-10-CM | POA: Diagnosis present

## 2018-11-20 DIAGNOSIS — H5462 Unqualified visual loss, left eye, normal vision right eye: Secondary | ICD-10-CM | POA: Diagnosis present

## 2018-11-20 DIAGNOSIS — A419 Sepsis, unspecified organism: Secondary | ICD-10-CM | POA: Diagnosis present

## 2018-11-20 DIAGNOSIS — I480 Paroxysmal atrial fibrillation: Secondary | ICD-10-CM | POA: Diagnosis present

## 2018-11-20 DIAGNOSIS — G9341 Metabolic encephalopathy: Secondary | ICD-10-CM | POA: Diagnosis present

## 2018-11-20 DIAGNOSIS — I10 Essential (primary) hypertension: Secondary | ICD-10-CM | POA: Diagnosis not present

## 2018-11-20 LAB — CBC WITH DIFFERENTIAL/PLATELET
Abs Immature Granulocytes: 0.08 10*3/uL — ABNORMAL HIGH (ref 0.00–0.07)
Basophils Absolute: 0 10*3/uL (ref 0.0–0.1)
Basophils Relative: 0 %
Eosinophils Absolute: 0 10*3/uL (ref 0.0–0.5)
Eosinophils Relative: 0 %
HCT: 42.3 % (ref 39.0–52.0)
Hemoglobin: 13.2 g/dL (ref 13.0–17.0)
Immature Granulocytes: 1 %
Lymphocytes Relative: 6 %
Lymphs Abs: 0.5 10*3/uL — ABNORMAL LOW (ref 0.7–4.0)
MCH: 28.8 pg (ref 26.0–34.0)
MCHC: 31.2 g/dL (ref 30.0–36.0)
MCV: 92.2 fL (ref 80.0–100.0)
Monocytes Absolute: 0.8 10*3/uL (ref 0.1–1.0)
Monocytes Relative: 10 %
Neutro Abs: 7.3 10*3/uL (ref 1.7–7.7)
Neutrophils Relative %: 83 %
Platelets: 135 10*3/uL — ABNORMAL LOW (ref 150–400)
RBC: 4.59 MIL/uL (ref 4.22–5.81)
RDW: 14.9 % (ref 11.5–15.5)
WBC: 8.8 10*3/uL (ref 4.0–10.5)
nRBC: 0 % (ref 0.0–0.2)

## 2018-11-20 LAB — URINALYSIS, ROUTINE W REFLEX MICROSCOPIC
Bacteria, UA: NONE SEEN
Bilirubin Urine: NEGATIVE
Glucose, UA: 50 mg/dL — AB
Ketones, ur: NEGATIVE mg/dL
Leukocytes,Ua: NEGATIVE
Nitrite: NEGATIVE
Protein, ur: NEGATIVE mg/dL
Specific Gravity, Urine: 1.02 (ref 1.005–1.030)
pH: 5 (ref 5.0–8.0)

## 2018-11-20 LAB — GLUCOSE, CAPILLARY
Glucose-Capillary: 113 mg/dL — ABNORMAL HIGH (ref 70–99)
Glucose-Capillary: 97 mg/dL (ref 70–99)

## 2018-11-20 LAB — COMPREHENSIVE METABOLIC PANEL
ALT: 350 U/L — ABNORMAL HIGH (ref 0–44)
AST: 351 U/L — ABNORMAL HIGH (ref 15–41)
Albumin: 2.6 g/dL — ABNORMAL LOW (ref 3.5–5.0)
Alkaline Phosphatase: 135 U/L — ABNORMAL HIGH (ref 38–126)
Anion gap: 12 (ref 5–15)
BUN: 46 mg/dL — ABNORMAL HIGH (ref 8–23)
CO2: 21 mmol/L — ABNORMAL LOW (ref 22–32)
Calcium: 9.1 mg/dL (ref 8.9–10.3)
Chloride: 112 mmol/L — ABNORMAL HIGH (ref 98–111)
Creatinine, Ser: 1.28 mg/dL — ABNORMAL HIGH (ref 0.61–1.24)
GFR calc Af Amer: 60 mL/min — ABNORMAL LOW (ref >60)
GFR calc non Af Amer: 51 mL/min — ABNORMAL LOW (ref >60)
Glucose, Bld: 93 mg/dL (ref 70–99)
Potassium: 4.8 mmol/L (ref 3.5–5.1)
Sodium: 145 mmol/L (ref 135–145)
Total Bilirubin: 1 mg/dL (ref 0.3–1.2)
Total Protein: 6.5 g/dL (ref 6.5–8.1)

## 2018-11-20 LAB — PROCALCITONIN: Procalcitonin: 0.58 ng/mL

## 2018-11-20 LAB — VITAMIN B12: Vitamin B-12: 3987 pg/mL — ABNORMAL HIGH (ref 180–914)

## 2018-11-20 LAB — CBG MONITORING, ED
Glucose-Capillary: 85 mg/dL (ref 70–99)
Glucose-Capillary: 80 mg/dL (ref 70–99)
Glucose-Capillary: 41 mg/dL — CL (ref 70–99)
Glucose-Capillary: 127 mg/dL — ABNORMAL HIGH (ref 70–99)
Glucose-Capillary: 100 mg/dL — ABNORMAL HIGH (ref 70–99)
Glucose-Capillary: 56 mg/dL — ABNORMAL LOW (ref 70–99)

## 2018-11-20 LAB — RPR: RPR Ser Ql: NONREACTIVE

## 2018-11-20 MED ORDER — ENOXAPARIN SODIUM 30 MG/0.3ML ~~LOC~~ SOLN: 30.0000 mg | SUBCUTANEOUS | Status: DC

## 2018-11-20 MED ORDER — DEXTROSE 50 % IV SOLN
1.0000 | Freq: Once | INTRAVENOUS | Status: AC
  Administered 2018-11-20: 50 mL via INTRAVENOUS
  Filled 2018-11-20: qty 50

## 2018-11-20 MED ORDER — SODIUM CHLORIDE 0.9 % IV SOLN
3.0000 g | Freq: Three times a day (TID) | INTRAVENOUS | Status: DC
  Administered 2018-11-20 – 2018-11-21 (×3): 3 g via INTRAVENOUS
  Filled 2018-11-20 (×2): qty 8
  Filled 2018-11-20: qty 3
  Filled 2018-11-20 (×2): qty 8

## 2018-11-20 MED ORDER — ENOXAPARIN SODIUM 40 MG/0.4ML ~~LOC~~ SOLN
40.0000 mg | SUBCUTANEOUS | Status: DC
  Administered 2018-11-20 – 2018-11-23 (×4): 40 mg via SUBCUTANEOUS
  Filled 2018-11-20 (×4): qty 0.4

## 2018-11-20 NOTE — Progress Notes (Signed)
Pharmacy Antibiotic Note  Dylan Orozco is a 85 y.o. male with PMH of parkinson's, Afib on anticoagulation, HTN, DM, HLD, prostate cancer, glaucoma, dementia, and recent frequent fall. Patient's weakness and dementia are noted to be worsening overall with dysphasia and chocking on own secretions. Pharmacy has been consulted for Unasyn dosing due to suspicion for aspiration pneumonia.   WBC 8.8, afebrile, Scr has decreased from 2.00 yesterday down to 1.28 this afternoon; CrCl 40.4 ml/min  Plan: Increase Unasyn to 3 gm IV Q 8 hrs Monitor renal function, clinical status, WBC, and temp, cultures  Height: 5\' 9"  (175.3 cm) Weight: 144 lb (65.3 kg) IBW/kg (Calculated) : 70.7  Temp (24hrs), Avg:98.4 F (36.9 C), Min:97.9 F (36.6 C), Max:98.6 F (37 C)  Recent Labs  Lab 11/15/18 1305 11/17/18 0816 11/19/18 1400 11/19/18 1728 11/20/18 0500 11/20/18 1314  WBC 6.2  --   --  9.4 8.8  --   CREATININE 1.64* 1.45* 2.00*  --   --  1.28*  LATICACIDVEN  --   --  1.9  --   --   --     Estimated Creatinine Clearance: 40.4 mL/min (A) (by C-G formula based on SCr of 1.28 mg/dL (H)).    Allergies  Allergen Reactions  . Tradjenta [Linagliptin]     rash  . Pravastatin     Constipation  . Ramipril      dizziness    Antimicrobials this admission: Ceftriaxone 10/28 X 1 Unasyn 10/28 >>   Microbiology results: 10/28 BCx: 1/2 with GPC in clusters 10/28 UCx: 60,000 colonies/ml Klebsiella pneumoniae, suscept pending 10/26 COVID: negative   Thank you for allowing pharmacy to be a part of this patient's care.  Gillermina Hu, PharmD, BCPS, [facility] Clinical Pharmacist 11/20/2018 7:11 PM

## 2018-11-20 NOTE — ED Notes (Addendum)
Informed attending of CBG, no new orders at this time, continue to monitor CBG and refer to protocol.

## 2018-11-20 NOTE — Progress Notes (Signed)
PHARMACY - PHYSICIAN COMMUNICATION CRITICAL VALUE ALERT - BLOOD CULTURE IDENTIFICATION (BCID)  Dylan Orozco is an 86 y.o. male who presented to [facility] on 11/15/2018 with a chief complaint of recurrent falls at home with generalized weakness in the setting of dementia and Parkinson's disease.  Assessment:  1/4 bottles with GPCCL, potentially a contaminant  Name of physician (or Provider) Contacted: Dr. Doristine Bosworth  Current antibiotics: Unasyn  Changes to prescribed antibiotics recommended:  Continue current antibiotic therapy for now   No results found for this or any previous visit.  Phillis Haggis 11/20/2018  12:26 PM
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
## 2018-11-20 NOTE — ED Notes (Signed)
Pt is alert and talking with CBG 41, pt c/o being unable to fully extend left leg, right leg is also drawn up and no weakness in left arm. This was reported to MD along with CBG

## 2018-11-20 NOTE — Progress Notes (Signed)
CSW in contact with Nelda Severe, patient's spouse Ferrell Monserrat sister. Evelena Peat has received verbal consent from patient and patients wife to be updated on the status of patients care. Evelena Peat explains that patients wife has dementia and is some times confused and has granted her permission to assist with the care of patient.   Evelena Peat explains that pts spouse, Deborra Medina is currently working towards obtaining a power of attorney. Evelena Peat goes into detail and explains that the family was expecting for patient to discharge to SNF today. Evelena Peat reports that they will attempt to complete power of attorney paper work with the assistance of patient while admitted.   CSW in contact with Patients wife, Shakell Senn to confirm this information, granting verbal permission to update Evelena Peat on status of patient care.   Nelda Severe [facility]: [phone]  [place] Social Worker  Ph: [phone]

## 2018-11-21 DIAGNOSIS — Z7189 Other specified counseling: Secondary | ICD-10-CM

## 2018-11-21 DIAGNOSIS — L89303 Pressure ulcer of unspecified buttock, stage 3: Secondary | ICD-10-CM

## 2018-11-21 DIAGNOSIS — L8990 Pressure ulcer of unspecified site, unspecified stage: Secondary | ICD-10-CM | POA: Insufficient documentation

## 2018-11-21 DIAGNOSIS — I10 Essential (primary) hypertension: Secondary | ICD-10-CM

## 2018-11-21 LAB — CBC WITH DIFFERENTIAL/PLATELET
Abs Immature Granulocytes: 0.09 10*3/uL — ABNORMAL HIGH (ref 0.00–0.07)
Basophils Absolute: 0 10*3/uL (ref 0.0–0.1)
Basophils Relative: 0 %
Eosinophils Absolute: 0 10*3/uL (ref 0.0–0.5)
Eosinophils Relative: 0 %
HCT: 41.8 % (ref 39.0–52.0)
Hemoglobin: 13.4 g/dL (ref 13.0–17.0)
Immature Granulocytes: 1 %
Lymphocytes Relative: 5 %
Lymphs Abs: 0.4 10*3/uL — ABNORMAL LOW (ref 0.7–4.0)
MCH: 29.1 pg (ref 26.0–34.0)
MCHC: 32.1 g/dL (ref 30.0–36.0)
MCV: 90.7 fL (ref 80.0–100.0)
Monocytes Absolute: 0.7 10*3/uL (ref 0.1–1.0)
Monocytes Relative: 8 %
Neutro Abs: 7.3 10*3/uL (ref 1.7–7.7)
Neutrophils Relative %: 86 %
Platelets: 142 10*3/uL — ABNORMAL LOW (ref 150–400)
RBC: 4.61 MIL/uL (ref 4.22–5.81)
RDW: 14.6 % (ref 11.5–15.5)
WBC: 8.5 10*3/uL (ref 4.0–10.5)
nRBC: 0 % (ref 0.0–0.2)

## 2018-11-21 LAB — URINE CULTURE: Culture: 60000 — AB

## 2018-11-21 LAB — GLUCOSE, CAPILLARY
Glucose-Capillary: 101 mg/dL — ABNORMAL HIGH (ref 70–99)
Glucose-Capillary: 103 mg/dL — ABNORMAL HIGH (ref 70–99)
Glucose-Capillary: 116 mg/dL — ABNORMAL HIGH (ref 70–99)
Glucose-Capillary: 119 mg/dL — ABNORMAL HIGH (ref 70–99)
Glucose-Capillary: 139 mg/dL — ABNORMAL HIGH (ref 70–99)
Glucose-Capillary: 65 mg/dL — ABNORMAL LOW (ref 70–99)
Glucose-Capillary: 84 mg/dL (ref 70–99)

## 2018-11-21 LAB — BASIC METABOLIC PANEL
Anion gap: 9 (ref 5–15)
BUN: 43 mg/dL — ABNORMAL HIGH (ref 8–23)
CO2: 22 mmol/L (ref 22–32)
Calcium: 9 mg/dL (ref 8.9–10.3)
Chloride: 116 mmol/L — ABNORMAL HIGH (ref 98–111)
Creatinine, Ser: 1.23 mg/dL (ref 0.61–1.24)
GFR calc Af Amer: >60 mL/min (ref >60)
GFR calc non Af Amer: 54 mL/min — ABNORMAL LOW (ref >60)
Glucose, Bld: 118 mg/dL — ABNORMAL HIGH (ref 70–99)
Potassium: 4.5 mmol/L (ref 3.5–5.1)
Sodium: 147 mmol/L — ABNORMAL HIGH (ref 135–145)

## 2018-11-21 MED ORDER — AMOXICILLIN-POT CLAVULANATE 400-57 MG/5ML PO SUSR
400.0000 mg | Freq: Two times a day (BID) | ORAL | Status: AC
  Administered 2018-11-21 – 2018-11-23 (×5): 400 mg via ORAL
  Filled 2018-11-21 (×5): qty 5

## 2018-11-21 NOTE — TOC Progression Note (Signed)
Transition of Care [facility]) - Progression Note    Patient Details  Name: Dylan Orozco MRN: [id number] Date of Birth: 03-27-1933  Transition of Care [facility] CM/SW [place], [place] Phone Number: 11/21/2018, 1:23 PM  Clinical Narrative:     Patient not doing well medically. PMT NP Tasha met with the family at bedside. CSW introduced herself and explained her role. Plan for now is to try feeds for the patient. Patient will remain in the hospital for the weekend for continued medical observation. Patient is declining. If patient continues to decline he might qualify for residential hospice.   CSW received a phone call from [facility] and they stated that his insurance authorization was good through today. CSW explained patient's decline in health status and plan is to watch him over the weekend.   CSW will start insurance authorization on Sunday.   CSW will continue to follow.   Expected Discharge Plan: Skilled Nursing Facility Barriers to Discharge: Ship broker  Expected Discharge Plan and Services Expected Discharge Plan: [place] In-house Referral: Clinical Social Work Discharge Planning Services: CM Consult Post Acute Care Choice: [place] Living arrangements for the past 2 months: Single Family Home                                       Social Determinants of Health (SDOH) Interventions    Readmission Risk Interventions No flowsheet data found.

## 2018-11-21 NOTE — Progress Notes (Signed)
Palliative:   Dylan Orozco is lying quietly in bed.  Unfortunately, he looks more weak and frail than he did yesterday.  He is blind, but is alert, and will look in my direction as we talk.  He hs known dementia, is oriented to self only at this time, but able to make his basic needs known.  Present today at bedside is wife of 79 years, Dylan Orozco and Dylan Orozco.    We talk in detail about Dylan Orozco's acute and chronic health concerns.  They describe a functional decline over the last 6 months.  He has also experienced weight loss.   We speak in detail about dysphagia, that there is no medication or treatment that can change his dysphagia.  We talked in detail about recurrent aspiration pneumonia.  We talked about PEG tube, risks and benefits.  Dylan Orozco clearly and emphatically states, "NO" when asked if he would accept a tube to feed him.  We talked about "safest diet".  We talked about discharge to rehab, I encourage family to work with Education officer, museum at facility.  I share that Dylan Orozco must participate in rehab in order to stay at any facility.  We talked about CODE STATUS.  I discussed the option of "treat the treatable, but allowing natural passing".  Dylan Orozco and his spouse agree that this is appropriate, orders changed.  We talk about the chronic illness pathway, what is normal and expected.  I share that Dylan Orozco would likely get sick again, sooner rather than later.  We talked about how he would like to be cared for.  We talked about residential hospice care, what is and is not provided.  Dylan Orozco asks if Dylan Orozco is eligible for residential hospice at this point.  I share that he would likely be eligible soon, but family would like a trial of rehab first.  Plan:   NO PEG tube, family states understanding aspiration risk/likelyhood. Start diet.   DNR status, goldenrod form completed.  At this point still accepted to [place] (as long as he is still qualified by  PT), [facility]  65 minutes, extended time    Quinn Axe, NP Palliative medicine team Team phone [phone] Greater than 50% of this time was spent in patient care and coordination of care.

## 2018-11-21 NOTE — Progress Notes (Signed)
PROGRESS NOTE    Dylan Orozco  [id number] DOB: Aug 21, 1933 DOA: 11/15/2018 PCP: Colon Branch, MD    Brief Narrative:  85 year old male who presented with recurrent falls.  He does have significant past medical history for Parkinson's, atrial fibrillation, hypertension, dyslipidemia, type 2 diabetes mellitus, dementia and prostate cancer.  He presented with recurrent falls and generalized weakness.  He had dysphagia with choking episodes with his own secretions.  Apparently he was waiting at the emergency department for about 4 days waiting for placement. At the time of admission his temperature was 100.9, his blood pressure systolic was in the 123XX123, he was tachycardic, and his oxygen saturation 90% on room air.  He was frail, ill looking appearing, his lungs had decreased breath sounds at bases, heart S1-S2 present rhythm, abdomen soft, no lower extremity edema, patient was able to follow commands. Sodium 138, potassium 5.0, chloride 105, bicarb 20, glucose 208, BUN 64, creatinine 2.0.  SARS COVID-19 was negative.  Patient was admitted to the hospital with working diagnosis of aspiration pneumonia complicated by sepsis, systemic inflammatory response syndrome, complicated by organ failure, hypotension.   Patient was started on in therapy, one of his blood cultures grew positive cocci.  Patient was seen by speech therapy with recommendations to keep nothing by mouth.  Palliative care was consulted, discussions have been made by PEG tube placement.   Assessment & Plan:   Principal Problem:   Aspiration pneumonia ([place]) Active Problems:   Diabetes mellitus ([place])   HYPERLIPIDEMIA   PROSTATE CANCER, HX OF   HTN (hypertension)   Parkinsonism ([place])   Glaucoma   Fall   Goals of care, counseling/discussion   Palliative care by specialist   DNR (do not resuscitate) discussion   Pressure injury of skin   1. Acute metabolic encephalopathy. Patient today remains confused but no agitation.  Follows commands. Continue neuro checks per unit protocol and physical therapy.   2. Sepsis due to aspiration pneumonia, (SIRS with hypotension and encephalopathy) present on admission. Patient with no dyspnea or cough, wbc count at 8,5. Blood culture positive for coag negative staph, likely a contaminant. Chest radiograph personally reviewed with bibasilar atelectasis. Patient on antibiotic therapy since 10/28 will complete of a total of 5 days with Augmentin. Continue with aspiration precautions.   3. AKi on CKD stage 3a/ hypernatremia. Stable renal function with serum cr at 1,23, K at 4,5 and bicarbonate at 21. Will continue to follow on renal panel in am,. Will hold on IV fluids and will patient have enteral feedings.   4. Paroxysmal atrial fibrillation. Continue rate control.   5. Dyslipidemia. Patient not on statin therapy.   6. T2DM. Fasting glucose is 118 today. Continue glucose cover and monitoring with insulin sliding scale, advance diet today.   7. Parkinson and dementia. Patient with swallow dysfunction and rapid decline in functional capacity. Family meeting today with palliative care, decision made to allow patient to eat by mouth with aspiration precautions, declined peg tube. Continue sinemet.   8. Bilateral heals stage 1 and sacrum stage 3 pressure ulcers. Present on admission, continue with local wound care.   DVT prophylaxis: enoxaparin   Code Status: dnr  Family Communication: no family at the bedside  Disposition Plan/ discharge barriers: pending placement.   Body mass index is 21.27 kg/m. Malnutrition Type:      Malnutrition Characteristics:      Nutrition Interventions:     RN Pressure Injury Documentation: Pressure Injury 11/20/18 Sacrum Medial Unstageable -  Full thickness tissue loss in which the base of the ulcer is covered by slough (yellow, tan, gray, green or brown) and/or eschar (tan, brown or black) in the wound bed. brown/black wound bed (Active)   11/20/18 1749  Location: Sacrum  Location Orientation: Medial  Staging: Unstageable - Full thickness tissue loss in which the base of the ulcer is covered by slough (yellow, tan, gray, green or brown) and/or eschar (tan, brown or black) in the wound bed.  Wound Description (Comments): brown/black wound bed  Present on Admission:      Pressure Injury 11/20/18 Heel Right Deep Tissue Injury - Purple or maroon localized area of discolored intact skin or blood-filled blister due to damage of underlying soft tissue from pressure and/or shear. (Active)  11/20/18 1754  Location: Heel  Location Orientation: Right  Staging: Deep Tissue Injury - Purple or maroon localized area of discolored intact skin or blood-filled blister due to damage of underlying soft tissue from pressure and/or shear.  Wound Description (Comments):   Present on Admission:      Pressure Injury 11/20/18 Heel Left;Posterior Deep Tissue Injury - Purple or maroon localized area of discolored intact skin or blood-filled blister due to damage of underlying soft tissue from pressure and/or shear. (Active)  11/20/18 2000  Location: Heel  Location Orientation: Left;Posterior  Staging: Deep Tissue Injury - Purple or maroon localized area of discolored intact skin or blood-filled blister due to damage of underlying soft tissue from pressure and/or shear.  Wound Description (Comments):   Present on Admission: Yes     Consultants:   Palliative care  Procedures:     Antimicrobials:   Unasyn  Augmentin     Subjective: Patient is confused but not agitates, complains of being thirsty, no dyspnea, no cough or chest pain, no nausea or vomiting.   Objective: Vitals:   11/20/18 1655 11/20/18 2022 11/21/18 0443 11/21/18 0815  BP: (!) 146/78 117/69 (!) 141/87 (!) 149/80  Pulse: 73 70 74 79  Resp: 16 18 18 18   Temp: 98.6 F (37 C) 97.9 F (36.6 C) 98 F (36.7 C) 98 F (36.7 C)  TempSrc: Oral Oral  Oral  SpO2: 100% 99%  100% 98%  Weight:      Height:        Intake/Output Summary (Last 24 hours) at 11/21/2018 1109 Last data filed at 11/21/2018 0900 Gross per 24 hour  Intake 100 ml  Output 700 ml  Net -600 ml   Filed Weights   11/16/18 0315  Weight: 65.3 kg    Examination:   General: Not in pain or dyspnea, deconditioned  Neurology: Awake and alert, non focal  E ENT: mild pallor, no icterus, oral mucosa moist Cardiovascular: No JVD. S1-S2 present, rhythmic, no gallops, rubs, or murmurs. No lower extremity edema. Pulmonary: positive breath sounds bilaterally, adequate air movement, no wheezing, rhonchi or rales. Gastrointestinal. Abdomen with, no organomegaly, non tender, no rebound or guarding Skin. Pressure ulcers sacrum stage 3 and heals stage 1.  Musculoskeletal: no joint deformities     Data Reviewed: I have personally reviewed following labs and imaging studies  CBC: Recent Labs  Lab 11/15/18 1305 11/19/18 1728 11/20/18 0500 11/21/18 0335  WBC 6.2 9.4 8.8 8.5  NEUTROABS  --  7.9* 7.3 7.3  HGB 12.4* 13.3 13.2 13.4  HCT 40.4 43.4 42.3 41.8  MCV 93.7 92.3 92.2 90.7  PLT 132* 137* 135* [id number]*   Basic Metabolic Panel: Recent Labs  Lab 11/15/18 1305 11/17/18 0816 11/19/18  1400 11/20/18 1314 11/21/18 0335  NA 144 139 138 145 147*  K 4.1 4.1 5.0 4.8 4.5  CL 112* 107 105 112* 116*  CO2 22 22 20* 21* 22  GLUCOSE 198* 148* 208* 93 118*  BUN 47* 35* 64* 46* 43*  CREATININE 1.64* 1.45* 2.00* 1.28* 1.23  CALCIUM 8.8* 9.0 9.0 9.1 9.0   GFR: Estimated Creatinine Clearance: 42 mL/min (by C-G formula based on SCr of 1.23 mg/dL). Liver Function Tests: Recent Labs  Lab 11/20/18 1314  AST 351*  ALT 350*  ALKPHOS 135*  BILITOT 1.0  PROT 6.5  ALBUMIN 2.6*   No results for input(s): LIPASE, AMYLASE in the last 168 hours. No results for input(s): AMMONIA in the last 168 hours. Coagulation Profile: No results for input(s): INR, PROTIME in the last 168 hours. Cardiac Enzymes:  No results for input(s): CKTOTAL, CKMB, CKMBINDEX, TROPONINI in the last 168 hours. BNP (last 3 results) No results for input(s): PROBNP in the last 8760 hours. HbA1C: No results for input(s): HGBA1C in the last 72 hours. CBG: Recent Labs  Lab 11/20/18 1710 11/20/18 2020 11/21/18 0052 11/21/18 0442 11/21/18 0716  GLUCAP 113* 97 116* 101* 103*   Lipid Profile: No results for input(s): CHOL, HDL, LDLCALC, TRIG, CHOLHDL, LDLDIRECT in the last 72 hours. Thyroid Function Tests: No results for input(s): TSH, T4TOTAL, FREET4, T3FREE, THYROIDAB in the last 72 hours. Anemia Panel: Recent Labs    11/20/18 0500  VITAMINB12 [id number]*      Radiology Studies: I have reviewed all of the imaging during this hospital visit personally     Scheduled Meds: . carbidopa-levodopa  1.5 tablet Oral TID  . dorzolamide-timolol  1 drop Both Eyes BID  . enoxaparin (LOVENOX) injection  40 mg Subcutaneous Q24H  . insulin aspart  0-9 Units Subcutaneous Q4H  . latanoprost  1 drop Right Eye QHS   Continuous Infusions: . sodium chloride 100 mL/hr at 11/20/18 0000  . ampicillin-sulbactam (UNASYN) IV 3 g (11/21/18 0308)     LOS: 1 day        Ardenia Stiner Gerome Apley, MD

## 2018-11-21 NOTE — Progress Notes (Signed)
Nutrition Brief Note  Chart reviewed. Per chart review, pt is medically declining. Plan for family meeting with palliative care today.Per CSW notes, pt may qualify to residential hospice. Plan for SLP consult for safest diet, however, pt remains NPO at this time.  No further nutrition interventions warranted at this time.  Please re-consult as needed.   Dylan Orozco A. Jimmye Norman, RD, LDN, [place] Registered Dietitian II Certified Diabetes Care and Education Specialist Pager: [phone] After hours Pager: [phone]

## 2018-11-21 NOTE — Plan of Care (Signed)
  Problem: Clinical Measurements: Goal: Ability to maintain clinical measurements within normal limits will improve Outcome: Progressing   Problem: Clinical Measurements: Goal: Respiratory complications will improve Outcome: Progressing   Problem: Nutrition: Goal: Adequate nutrition will be maintained Outcome: Not Progressing

## 2018-11-21 NOTE — Progress Notes (Addendum)
PT Cancellation Note  Patient Details Name: Dylan Orozco MRN: [id number] DOB: [birth year]   Cancelled Treatment:    Reason Eval/Treat Not Completed: Patient declined, no reason specified. Patient confused. Asks that I come back tomorrow. Will possibly re-attempt PT later today if time allows.  Re-attempted PT this afternoon. Patient awake, however would not respond to me. Will re-attempt at later date.    Joahan Swatzell 11/21/2018, 11:26 AM

## 2018-11-21 NOTE — Progress Notes (Signed)
SLP Cancellation Note  Patient Details Name: Dylan Orozco MRN: [id number] DOB: Dec 22, 1933   Cancelled treatment:       Reason Eval/Treat Not Completed: Other (Palliative Medicine team at bedside with family, requesting therapist to come back at a later time,  SLP will continue to follow along)   Alem Fahl, Selinda Orion 11/21/2018, 11:40 AM

## 2018-11-22 LAB — BASIC METABOLIC PANEL
Anion gap: 11 (ref 5–15)
BUN: 45 mg/dL — ABNORMAL HIGH (ref 8–23)
CO2: 22 mmol/L (ref 22–32)
Calcium: 9 mg/dL (ref 8.9–10.3)
Chloride: 115 mmol/L — ABNORMAL HIGH (ref 98–111)
Creatinine, Ser: 1.31 mg/dL — ABNORMAL HIGH (ref 0.61–1.24)
GFR calc Af Amer: 58 mL/min — ABNORMAL LOW (ref >60)
GFR calc non Af Amer: 50 mL/min — ABNORMAL LOW (ref >60)
Glucose, Bld: 94 mg/dL (ref 70–99)
Potassium: 4.2 mmol/L (ref 3.5–5.1)
Sodium: 148 mmol/L — ABNORMAL HIGH (ref 135–145)

## 2018-11-22 LAB — CBC WITH DIFFERENTIAL/PLATELET
Abs Immature Granulocytes: 0.08 10*3/uL — ABNORMAL HIGH (ref 0.00–0.07)
Basophils Absolute: 0 10*3/uL (ref 0.0–0.1)
Basophils Relative: 0 %
Eosinophils Absolute: 0 10*3/uL (ref 0.0–0.5)
Eosinophils Relative: 0 %
HCT: 39.4 % (ref 39.0–52.0)
Hemoglobin: 12 g/dL — ABNORMAL LOW (ref 13.0–17.0)
Immature Granulocytes: 1 %
Lymphocytes Relative: 6 %
Lymphs Abs: 0.6 10*3/uL — ABNORMAL LOW (ref 0.7–4.0)
MCH: 28 pg (ref 26.0–34.0)
MCHC: 30.5 g/dL (ref 30.0–36.0)
MCV: 92.1 fL (ref 80.0–100.0)
Monocytes Absolute: 0.7 10*3/uL (ref 0.1–1.0)
Monocytes Relative: 7 %
Neutro Abs: 8.2 10*3/uL — ABNORMAL HIGH (ref 1.7–7.7)
Neutrophils Relative %: 86 %
Platelets: 167 10*3/uL (ref 150–400)
RBC: 4.28 MIL/uL (ref 4.22–5.81)
RDW: 14.6 % (ref 11.5–15.5)
WBC: 9.5 10*3/uL (ref 4.0–10.5)
nRBC: 0 % (ref 0.0–0.2)

## 2018-11-22 LAB — GLUCOSE, CAPILLARY
Glucose-Capillary: 104 mg/dL — ABNORMAL HIGH (ref 70–99)
Glucose-Capillary: 106 mg/dL — ABNORMAL HIGH (ref 70–99)
Glucose-Capillary: 142 mg/dL — ABNORMAL HIGH (ref 70–99)
Glucose-Capillary: 159 mg/dL — ABNORMAL HIGH (ref 70–99)
Glucose-Capillary: 167 mg/dL — ABNORMAL HIGH (ref 70–99)
Glucose-Capillary: 72 mg/dL (ref 70–99)
Glucose-Capillary: 83 mg/dL (ref 70–99)

## 2018-11-22 LAB — CULTURE, BLOOD (ROUTINE X 2)

## 2018-11-22 NOTE — Progress Notes (Signed)
PROGRESS NOTE    Dylan Orozco  [id number] DOB: May 11, 1933 DOA: 11/15/2018 PCP: Colon Branch, MD    Brief Narrative:  85 year old male who presented with recurrent falls.  He does have significant past medical history for Parkinson's, atrial fibrillation, hypertension, dyslipidemia, type 2 diabetes mellitus, dementia and prostate cancer.  He presented with recurrent falls and generalized weakness.  He had dysphagia with choking episodes with his own secretions.  Apparently he was waiting at the emergency department for about 4 days waiting for placement. At the time of admission his temperature was 100.9, his blood pressure systolic was in the 123XX123, he was tachycardic, and his oxygen saturation 90% on room air.  He was frail, ill looking appearing, his lungs had decreased breath sounds at bases, heart S1-S2 present rhythm, abdomen soft, no lower extremity edema, patient was able to follow commands. Sodium 138, potassium 5.0, chloride 105, bicarb 20, glucose 208, BUN 64, creatinine 2.0.  SARS COVID-19 was negative.  Patient was admitted to the hospital with working diagnosis of aspiration pneumonia complicated by sepsis, systemic inflammatory response syndrome, complicated by organ failure, hypotension.   Patient was started on in therapy, one of his blood cultures grew positive cocci.  Patient was seen by speech therapy with recommendations to keep nothing by mouth.  Palliative care was consulted, discussions have been made by PEG tube placement.  Decision has been made to continue feeding by mouth with aspiration precautions, avoid peg tube. Continue palliative care approach. Patient very deconditioned, plan to transfer to SNF if tolerates well a dysphagia diet.     Assessment & Plan:   Principal Problem:   Aspiration pneumonia ([place]) Active Problems:   Diabetes mellitus ([place])   HYPERLIPIDEMIA   PROSTATE CANCER, HX OF   HTN (hypertension)   Parkinsonism ([place])   Glaucoma  Fall   Goals of care, counseling/discussion   Palliative care by specialist   DNR (do not resuscitate) discussion   Pressure injury of skin   Encounter for hospice care discussion    1. Acute metabolic encephalopathy. This am continue to be confused with no agitation. Continue close monitoring, physical therapy, out of bed as tolerated, diet no has been advanced.   2. Sepsis due to aspiration pneumonia, (SIRS with hypotension and encephalopathy) present on admission. Blood culture positive for coag negative staph, likely a contaminant.   Wbc count at 9,5. Follow up chest radiograph with bibasilar atelectasis. Continue antibiotic therapy with Augmentin #4/5 of effective antibiotic therapy. Continue aspiration precautions, head elevated from bed 45 degrees at all times.  Continue with bronchodilator therapy.   3. AKi on CKD stage 3a/ hypernatremia. Renal function with serum cr at 1,31 K at 4,2 and bicarbonate at 22, Na 148. Will continue to encourage po intake including free water. Patient now off IV fluids.    4. Paroxysmal atrial fibrillation. Rate controlled, continue telemetry monitoring for now.   5. Dyslipidemia. Resume atorvastatin 20 mg per home regimen.    6. T2DM. This am fasting glucose is 115 mg/dl. Glucose cover and monitoring with insulin sliding scale.   7. Parkinson and dementia/ swallow dysfunction and rapid decline in functional capacity. . Poor prognosis, per family meeting with palliative care, decision made to allow patient to eat by mouth with aspiration precautions, declined peg tube. On sinemet. As needed lorazepam.   8. Bilateral heals stage 1 and sacrum stage 3 pressure ulcers. Present on admission, On local wound care.     Body mass index is 21.27  kg/m. Malnutrition Type:      Malnutrition Characteristics:      Nutrition Interventions:     RN Pressure Injury Documentation: Pressure Injury 11/20/18 Sacrum Medial Unstageable - Full thickness  tissue loss in which the base of the ulcer is covered by slough (yellow, tan, gray, green or brown) and/or eschar (tan, brown or black) in the wound bed. brown/black wound bed (Active)  11/20/18 1749  Location: Sacrum  Location Orientation: Medial  Staging: Unstageable - Full thickness tissue loss in which the base of the ulcer is covered by slough (yellow, tan, gray, green or brown) and/or eschar (tan, brown or black) in the wound bed.  Wound Description (Comments): brown/black wound bed  Present on Admission:      Pressure Injury 11/20/18 Heel Right Deep Tissue Injury - Purple or maroon localized area of discolored intact skin or blood-filled blister due to damage of underlying soft tissue from pressure and/or shear. (Active)  11/20/18 1754  Location: Heel  Location Orientation: Right  Staging: Deep Tissue Injury - Purple or maroon localized area of discolored intact skin or blood-filled blister due to damage of underlying soft tissue from pressure and/or shear.  Wound Description (Comments):   Present on Admission:      Pressure Injury 11/20/18 Heel Left;Posterior Deep Tissue Injury - Purple or maroon localized area of discolored intact skin or blood-filled blister due to damage of underlying soft tissue from pressure and/or shear. (Active)  11/20/18 2000  Location: Heel  Location Orientation: Left;Posterior  Staging: Deep Tissue Injury - Purple or maroon localized area of discolored intact skin or blood-filled blister due to damage of underlying soft tissue from pressure and/or shear.  Wound Description (Comments):   Present on Admission: Yes     Consultants:   Palliative care   Procedures:     Antimicrobials:       Subjective: Patient is confused this am, not agitates, has been tolerating po well, no nausea or vomiting, no dyspnea or chest pain.   Objective: Vitals:   11/22/18 0450 11/22/18 0458 11/22/18 0458 11/22/18 0925  BP:  (!) 90/59 (!) 90/59 98/62  Pulse: (!)  111 70 68 70  Resp:  19 19 18   Temp:  97.8 F (36.6 C) 97.8 F (36.6 C) 98.3 F (36.8 C)  TempSrc:    Oral  SpO2: 99% 100% 99% 100%  Weight:      Height:        Intake/Output Summary (Last 24 hours) at 11/22/2018 1215 Last data filed at 11/22/2018 [id number] Gross per 24 hour  Intake 540 ml  Output 1250 ml  Net -710 ml   Filed Weights   11/16/18 0315  Weight: 65.3 kg    Examination:   General: Not in pain or dyspnea, deconditioned  Neurology: Awake and alert, non focal. Confused but not agitated  E ENT: mild pallor, no icterus, oral mucosa moist Cardiovascular: No JVD. S1-S2 present, rhythmic, no gallops, rubs, or murmurs. No lower extremity edema. Pulmonary: positive breath sounds bilaterally, no wheezing, but scattered rhonchi with no rales. Gastrointestinal. Abdomen with no organomegaly, non tender, no rebound or guarding Skin. No rashes Musculoskeletal: no joint deformities     Data Reviewed: I have personally reviewed following labs and imaging studies  CBC: Recent Labs  Lab 11/15/18 1305 11/19/18 1728 11/20/18 0500 11/21/18 0335 11/22/18 0549  WBC 6.2 9.4 8.8 8.5 9.5  NEUTROABS  --  7.9* 7.3 7.3 8.2*  HGB 12.4* 13.3 13.2 13.4 12.0*  HCT 40.4 43.4  42.3 41.8 39.4  MCV 93.7 92.3 92.2 90.7 92.1  PLT 132* 137* 135* 142* [id number]   Basic Metabolic Panel: Recent Labs  Lab 11/17/18 0816 11/19/18 1400 11/20/18 1314 11/21/18 0335 11/22/18 0549  NA 139 138 145 147* 148*  K 4.1 5.0 4.8 4.5 4.2  CL 107 105 112* 116* 115*  CO2 22 20* 21* 22 22  GLUCOSE 148* 208* 93 118* 94  BUN 35* 64* 46* 43* 45*  CREATININE 1.45* 2.00* 1.28* 1.23 1.31*  CALCIUM 9.0 9.0 9.1 9.0 9.0   GFR: Estimated Creatinine Clearance: 39.5 mL/min (A) (by C-G formula based on SCr of 1.31 mg/dL (H)). Liver Function Tests: Recent Labs  Lab 11/20/18 1314  AST 351*  ALT 350*  ALKPHOS 135*  BILITOT 1.0  PROT 6.5  ALBUMIN 2.6*   No results for input(s): LIPASE, AMYLASE in the last 168  hours. No results for input(s): AMMONIA in the last 168 hours. Coagulation Profile: No results for input(s): INR, PROTIME in the last 168 hours. Cardiac Enzymes: No results for input(s): CKTOTAL, CKMB, CKMBINDEX, TROPONINI in the last 168 hours. BNP (last 3 results) No results for input(s): PROBNP in the last 8760 hours. HbA1C: No results for input(s): HGBA1C in the last 72 hours. CBG: Recent Labs  Lab 11/21/18 2026 11/22/18 0016 11/22/18 0458 11/22/18 0722 11/22/18 1119  GLUCAP 139* 104* 83 72 106*   Lipid Profile: No results for input(s): CHOL, HDL, LDLCALC, TRIG, CHOLHDL, LDLDIRECT in the last 72 hours. Thyroid Function Tests: No results for input(s): TSH, T4TOTAL, FREET4, T3FREE, THYROIDAB in the last 72 hours. Anemia Panel: Recent Labs    11/20/18 0500  VITAMINB12 [id number]*      Radiology Studies: I have reviewed all of the imaging during this hospital visit personally     Scheduled Meds: . amoxicillin-clavulanate  400 mg Oral Q12H  . carbidopa-levodopa  1.5 tablet Oral TID  . dorzolamide-timolol  1 drop Both Eyes BID  . enoxaparin (LOVENOX) injection  40 mg Subcutaneous Q24H  . insulin aspart  0-9 Units Subcutaneous Q4H  . latanoprost  1 drop Right Eye QHS   Continuous Infusions:   LOS: 2 days         Gerome Apley, MD

## 2018-11-23 ENCOUNTER — Other Ambulatory Visit: Payer: Self-pay | Admitting: Internal Medicine

## 2018-11-23 LAB — GLUCOSE, CAPILLARY
Glucose-Capillary: 101 mg/dL — ABNORMAL HIGH (ref 70–99)
Glucose-Capillary: 104 mg/dL — ABNORMAL HIGH (ref 70–99)
Glucose-Capillary: 128 mg/dL — ABNORMAL HIGH (ref 70–99)
Glucose-Capillary: 130 mg/dL — ABNORMAL HIGH (ref 70–99)
Glucose-Capillary: 92 mg/dL (ref 70–99)
Glucose-Capillary: 92 mg/dL (ref 70–99)

## 2018-11-23 LAB — BASIC METABOLIC PANEL
Anion gap: 8 (ref 5–15)
BUN: 42 mg/dL — ABNORMAL HIGH (ref 8–23)
CO2: 22 mmol/L (ref 22–32)
Calcium: 9 mg/dL (ref 8.9–10.3)
Chloride: 117 mmol/L — ABNORMAL HIGH (ref 98–111)
Creatinine, Ser: 1.25 mg/dL — ABNORMAL HIGH (ref 0.61–1.24)
GFR calc Af Amer: >60 mL/min (ref >60)
GFR calc non Af Amer: 53 mL/min — ABNORMAL LOW (ref >60)
Glucose, Bld: 92 mg/dL (ref 70–99)
Potassium: 4.2 mmol/L (ref 3.5–5.1)
Sodium: 147 mmol/L — ABNORMAL HIGH (ref 135–145)

## 2018-11-23 NOTE — Plan of Care (Signed)
  Problem: Nutrition Goal: Patient maintains adequate hydration Outcome: Progressing   Problem: Skin Integrity: Goal: Risk for impaired skin integrity will decrease Outcome: Progressing

## 2018-11-23 NOTE — Progress Notes (Signed)
CSW notified PT/OT to have the patient evaluated so that she can start insurance authorization.   CSW will continue to follow.   Domenic Schwab, MSW, [place] Worker [facility]  [phone]

## 2018-11-23 NOTE — TOC Progression Note (Signed)
Transition of Care [facility]) - Progression Note    Patient Details  Name: Dylan Orozco MRN: [id number] Date of Birth: 04-May-1933  [facility] CM/SW [place], [place] Phone Number: 11/23/2018, 11:00 AM  Clinical Narrative:     CSW submitted clinicals to [place] [facility] for PASSR screening. CSW submitted insurance clinicals to start insurance authorization.   CSW will continue to follow and assist with disposition planning.   Expected Discharge Plan: Skilled Nursing Facility Barriers to Discharge: Ship broker, [place] Forensic scientist)  Expected Discharge Plan and Services Expected Discharge Plan: [place] In-house Referral: Clinical Social Work Discharge Planning Services: CM Consult Post Acute Care Choice: [place] arrangements for the past 2 months: Single Family Home                                       Social Determinants of Health (SDOH) Interventions    Readmission Risk Interventions No flowsheet data found.

## 2018-11-23 NOTE — Progress Notes (Signed)
Physical Therapy Treatment Patient Details Name: Dylan Orozco MRN: [id number] DOB: 27-Feb-1933 Today's Date: 11/23/2018    History of Present Illness 85 y.o. male admitted on 11/15/18 for multiple falls and inability of family to care for him at this time.  CT is pending and medical and physical workup in progress.  Pt with significant PMH of DM2, trigeminy, prostate CA, PNA, HTN, glaucoma/cataracts, depression, blind in L eye, anxiety, mini shunt insertion, Parkinson's.     PT Comments    Limited session as pt worked with OT earlier this AM and is fatigued.  He participated in supine ROM exercises and transition to EOB.  Attempted to stand with RW (his usual AD), however, pt reporting he needed rest and did not assist in standing even with PT's initiation forward.  Pt repositioned in supine with pillows for pressure relief. Smooth jazz played throughout session and pt responded positively to music.  Left music playing in room at end of session.  Pt remains appropriate for SNF level rehab as he was very recently ambulatory with RW.  PT will continue to follow acutely for safe mobility progression.   Follow Up Recommendations  SNF     Equipment Recommendations  Wheelchair (measurements PT);Wheelchair cushion (measurements PT);Hospital bed    Recommendations for Other Services   NA     Precautions / Restrictions Precautions Precautions: Fall Precaution Comments: three falls in recent weeks Restrictions Weight Bearing Restrictions: No    Mobility  Bed Mobility Overal bed mobility: Needs Assistance Bed Mobility: Supine to Sit;Sit to Supine     Supine to sit: Total assist;HOB elevated Sit to supine: Total assist;HOB elevated   General bed mobility comments: Pt needs assist at trunk and LEs, very stiff and functioning as a unit to move legs over, trunk comes over in fetal position despite LE/UE ROM preformed prior to mobility.   Transfers Overall transfer level: Needs  assistance Equipment used: Rolling walker (2 wheeled) Transfers: Sit to/from Stand Sit to Stand: Total assist         General transfer comment: Attempted to stand at EOB with RW (his usual AD), but pt reporting he needs to rest now, not attempting to assist in powering up to standing when PT initiated movement, attempted x 2 and pt continuing to say, "I need to rest now" so repositioned back in bed.          Balance Overall balance assessment: Needs assistance Sitting-balance support: Feet supported;Bilateral upper extremity supported Sitting balance-Leahy Scale: Poor Sitting balance - Comments: close supervision EOB once positione with feet supported and pt balanced.  Up to min assist .   Postural control: Posterior lean;Right lateral lean Standing balance support: Single extremity supported Standing balance-Leahy Scale: Zero Standing balance comment: Pt able to achieve partial stand with max A, but unable to extend into full extension                             Cognition Arousal/Alertness: Awake/alert Behavior During Therapy: WFL for tasks assessed/performed Overall Cognitive Status: Impaired/Different from baseline Area of Impairment: Orientation;Attention;Memory;Following commands;Safety/judgement;Awareness;Problem solving                 Orientation Level: Disoriented to;Time;Situation;Place Current Attention Level: Sustained Memory: Decreased short-term memory Following Commands: Follows one step commands inconsistently;Follows one step commands with increased time Safety/Judgement: Decreased awareness of safety;Decreased awareness of deficits Awareness: Intellectual Problem Solving: Slow processing;Decreased initiation;Difficulty sequencing;Requires verbal cues;Requires tactile cues General Comments:  orientation not specifically tested.  Reports he needs to rest because he has been moving a lot this AM (which is true, OT worked with him earlier as  well).       Exercises General Exercises - Upper Extremity Shoulder Flexion: AAROM;Both;10 reps Elbow Flexion: AAROM;Both;10 reps Wrist Extension: AAROM;Both;10 reps General Exercises - Lower Extremity Ankle Circles/Pumps: PROM;Both;10 reps Heel Slides: PROM;Both;10 reps    General Comments General comments (skin integrity, edema, etc.): Pt reported he liked smooth jazz, so played during our session and left playing in room at end of session.  Pt enjoyed the addition of music to his session. Pt positioned in bed with pillows for pressure relief.  He would benefit from prevalons for heel pressure relief.       Pertinent Vitals/Pain Pain Assessment: Faces Faces Pain Scale: Hurts little more Pain Location: between his legs and with bil ankle ROM Pain Descriptors / Indicators: Grimacing;Guarding Pain Intervention(s): Limited activity within patient's tolerance;Monitored during session;Repositioned    Home Living Family/patient expects to be discharged to:: Private residence Living Arrangements: Spouse/significant other(can only provide supervision) Available Help at Discharge: Family Type of Home: House Home Access: Level entry   Home Layout: One level Home Equipment: Cane - single point;Walker - 4 wheels Additional Comments: info gleaned from chart review as pt unable to provide info and no family present.   dtr, Monique, checks in on them regularly    Prior Function Level of Independence: Needs assistance  Gait / Transfers Assistance Needed: pt, at baseline ambulates with a rollator, but has had multiple recent falls.  ADL's / Homemaking Assistance Needed: pt unable to provide info and no family present      PT Goals (current goals can now be found in the care plan section) Acute Rehab PT Goals Patient Stated Goal: "to rest" Progress towards PT goals: Progressing toward goals    Frequency    Min 2X/week      PT Plan Current plan remains appropriate       AM-PAC  PT "6 Clicks" Mobility   Outcome Measure  Help needed turning from your back to your side while in a flat bed without using bedrails?: Total Help needed moving from lying on your back to sitting on the side of a flat bed without using bedrails?: Total Help needed moving to and from a bed to a chair (including a wheelchair)?: Total Help needed standing up from a chair using your arms (e.g., wheelchair or bedside chair)?: Total Help needed to walk in hospital room?: Total Help needed climbing 3-5 steps with a railing? : Total 6 Click Score: 6    End of Session   Activity Tolerance: Patient limited by pain Patient left: in bed;with call bell/phone within reach;with bed alarm set   PT Visit Diagnosis: Muscle weakness (generalized) (M62.81);Difficulty in walking, not elsewhere classified (R26.2)     Time: [id number] PT Time Calculation (min) (ACUTE ONLY): 18 min  Charges:  $Therapeutic Exercise: 8-22 mins            Michel Hendon B. Marynell Bies, PT, DPT  Acute Rehabilitation [phone] pager [phone] office  @ [facility]: [phone]            11/23/2018, 10:42 AM

## 2018-11-23 NOTE — Evaluation (Signed)
Occupational Therapy Re- Evaluation Patient Details Name: Dylan Orozco MRN: [id number] DOB: February 13, 1933 Today's Date: 11/23/2018    History of Present Illness 85 y.o. male admitted on 11/15/18 for multiple falls and inability of family to care for him at this time.  CT is pending and medical and physical workup in progress.  Pt with significant PMH of DM2, trigeminy, prostate CA, PNA, HTN, glaucoma/cataracts, depression, blind in L eye, anxiety, mini shunt insertion, Parkinson's.    Clinical Impression   Pt was seen by OT for re-evaluation.  Pt is more alert this day with increased participation.  He is able to perform grooming tasks with mod - max A, and able to sit EOB with min guard assist to mod A.  He stood partially with max A, but was unable to fully stand and unable to safely transfer with +1 assist.  PTA, he lived with family and required assist for ADLs, but was able to ambulate with RW.  He would benefit from SNF level rehab as he need consistent, daily PT/OT to allow him to maximize safety and independence with ADLs and functional mobility.  Will follow acutely.     Follow Up Recommendations  SNF    Equipment Recommendations  None recommended by OT    Recommendations for Other Services       Precautions / Restrictions Precautions Precautions: Fall Precaution Comments: three falls in recent weeks      Mobility Bed Mobility Overal bed mobility: Needs Assistance Bed Mobility: Supine to Sit;Sit to Supine     Supine to sit: HOB elevated;Total assist Sit to supine: Total assist   General bed mobility comments: Pt requires assist to move LEs off bed and assist to lift trunk from bed   Transfers                 General transfer comment: unable to attempt due to lethargy     Balance Overall balance assessment: Needs assistance;History of Falls Sitting-balance support: Feet supported;Bilateral upper extremity supported Sitting balance-Leahy Scale:  Poor Sitting balance - Comments: Pt required min guard assist to mod A to maintain EOB sitting.  He demonstrated a slow gradual posterior and Lt lean and required assist to shift weight to Rt and forward  Postural control: Posterior lean;Right lateral lean Standing balance support: Single extremity supported Standing balance-Leahy Scale: Zero Standing balance comment: Pt able to achieve partial stand with max A, but unable to extend into full extension                            ADL either performed or assessed with clinical judgement   ADL Overall ADL's : Needs assistance/impaired Eating/Feeding: Bed level;Total assistance   Grooming: Wash/dry hands;Wash/dry face;Oral care;Moderate assistance;Maximal assistance;Sitting Grooming Details (indicate cue type and reason): Pt able to wash face with min facilitation to initiate task and mod A to maintain balance EOB.  Max A to wash hands  Upper Body Bathing: Bed level;Sitting;Maximal assistance   Lower Body Bathing: Bed level;Maximal assistance;Sit to/from stand   Upper Body Dressing : Total assistance;Bed level   Lower Body Dressing: Bed level;Moderate assistance   Toilet Transfer: Total assistance Toilet Transfer Details (indicate cue type and reason): unable to attempt  Toileting- Clothing Manipulation and Hygiene: Total assistance;Bed level       Functional mobility during ADLs: Total assistance General ADL Comments: Pt is eager to work with OT, but does fatigue      Vision  Baseline Vision/History: Legally blind       Perception Perception Perception Tested?: No   Praxis Praxis Praxis tested?: Deficits Deficits: Ideation;Initiation;Organization    Pertinent Vitals/Pain Pain Assessment: Faces Faces Pain Scale: Hurts little more Pain Location: Pt reports it hurts between his legs  Pain Descriptors / Indicators: Grimacing;Guarding Pain Intervention(s): Monitored during session     Hand Dominance Right    Extremity/Trunk Assessment Upper Extremity Assessment Upper Extremity Assessment: RUE deficits/detail;LUE deficits/detail;Generalized weakness RUE Deficits / Details: rigidity noted bil.  LUE Deficits / Details: rigidity noted bil.    Lower Extremity Assessment Lower Extremity Assessment: Defer to PT evaluation   Cervical / Trunk Assessment Cervical / Trunk Assessment: Kyphotic(rigidity noted.  unable to disassociate upper and lower trun)   Communication Communication Communication: HOH   Cognition Arousal/Alertness: Awake/alert Behavior During Therapy: Flat affect;WFL for tasks assessed/performed Overall Cognitive Status: No family/caregiver present to determine baseline cognitive functioning Area of Impairment: Orientation;Attention;Memory;Following commands;Safety/judgement;Awareness;Problem solving                 Orientation Level: Disoriented to;Time;Situation;Place Current Attention Level: Sustained Memory: Decreased short-term memory Following Commands: Follows one step commands inconsistently;Follows one step commands with increased time Safety/Judgement: Decreased awareness of safety;Decreased awareness of deficits   Problem Solving: Slow processing;Decreased initiation;Difficulty sequencing;Requires verbal cues;Requires tactile cues General Comments: Pt is alert, but with eyes closed.  He is interactive, but slow to respond.  Administered the Short Blessed Test. He scored 23/28 - he was able to state correct time of day, and was able to recall 1 piece on info provided after ~4  min delay.  Otherwise.      General Comments  Pt with blisters on Lt UE proximally and distally to elbow     Exercises     Shoulder Instructions      Home Living Family/patient expects to be discharged to:: Private residence Living Arrangements: Spouse/significant other(can only provide supervision) Available Help at Discharge: Family Type of Home: House Home Access: Level entry      Home Layout: One level     Bathroom Shower/Tub: Occupational psychologist: [place] - single point;Walker - 4 wheels   Additional Comments: info gleaned from chart review as pt unable to provide info and no family present.   dtr, Monique, checks in on them regularly      Prior Functioning/Environment Level of Independence: Needs assistance  Gait / Transfers Assistance Needed: pt, at baseline ambulates with a rollator, but has had multiple recent falls.  ADL's / Homemaking Assistance Needed: pt unable to provide info and no family present             OT Problem List: Decreased strength;Decreased activity tolerance;Decreased range of motion;Impaired balance (sitting and/or standing);Decreased cognition;Decreased coordination;Decreased safety awareness;Decreased knowledge of use of DME or AE;Impaired tone;Impaired UE functional use;Impaired vision/perception      OT Treatment/Interventions: Self-care/ADL training;Neuromuscular education;DME and/or AE instruction;Therapeutic activities;Cognitive remediation/compensation;Visual/perceptual remediation/compensation;Patient/family education;Balance training;Therapeutic exercise    OT Goals(Current goals can be found in the care plan section) Acute Rehab OT Goals Patient Stated Goal: to get stronger  OT Goal Formulation: With patient Time For Goal Achievement: 12/07/18 Potential to Achieve Goals: (for goals stated ) ADL Goals Pt Will Perform Eating: with mod assist;sitting Pt Will Perform Grooming: with mod assist;sitting Pt Will Perform Upper Body Bathing: with mod assist;sitting Pt Will Perform Lower Body Bathing: with mod assist;sit to/from stand Pt Will Transfer to Toilet: with max assist;squat  pivot transfer;bedside commode  OT Frequency: Min 2X/week   Barriers to D/C: Decreased caregiver support          Co-evaluation              AM-PAC OT "6 Clicks" Daily Activity     Outcome  Measure Help from another person eating meals?: Total Help from another person taking care of personal grooming?: A Lot Help from another person toileting, which includes using toliet, bedpan, or urinal?: Total Help from another person bathing (including washing, rinsing, drying)?: A Lot Help from another person to put on and taking off regular upper body clothing?: Total Help from another person to put on and taking off regular lower body clothing?: Total 6 Click Score: 8   End of Session Nurse Communication: Mobility status  Activity Tolerance: Patient limited by lethargy;Patient tolerated treatment well Patient left: in bed;with call bell/phone within reach;with bed alarm set  OT Visit Diagnosis: Unsteadiness on feet (R26.81);Cognitive communication deficit (R41.841);Muscle weakness (generalized) (M62.81);Apraxia (R48.2)                Time: [id number] OT Time Calculation (min): 18 min Charges:  OT General Charges $OT Visit: 1 Visit OT Evaluation $OT Re-eval: 1 Re-eval  Lucille Passy, OTR/L Acute Rehabilitation Services Pager [phone] Office [phone]   Lucille Passy M 11/23/2018, 9:44 AM

## 2018-11-23 NOTE — Progress Notes (Signed)
PROGRESS NOTE    KOLBIE ALLENBAUGH  [id number] DOB: 18-Mar-1933 DOA: 11/15/2018 PCP: Colon Branch, MD    Brief Narrative:  85 year old male who presented with recurrent falls.  He does have significant past medical history for Parkinson's, atrial fibrillation, hypertension, dyslipidemia, type 2 diabetes mellitus, dementia and prostate cancer.  He presented with recurrent falls and generalized weakness.  He had dysphagia with choking episodes with his own secretions.  Apparently he was waiting at the emergency department for about 4 days waiting for placement. At the time of admission his temperature was 100.9, his blood pressure systolic was in the [id number], he was tachycardic, and his oxygen saturation 90% on room air.  He was frail, ill looking appearing, his lungs had decreased breath sounds at bases, heart S1-S2 present rhythm, abdomen soft, no lower extremity edema, patient was able to follow commands. Sodium 138, potassium 5.0, chloride 105, bicarb 20, glucose 208, BUN 64, creatinine 2.0.  SARS COVID-19 was negative.  Patient was admitted to the hospital with working diagnosis of aspiration pneumonia complicated by sepsis, systemic inflammatory response syndrome, complicated by organ failure, hypotension.   Patient was started on in therapy, one of his blood cultures grew positive cocci.  Patient was seen by speech therapy with recommendations to keep nothing by mouth.  Palliative care was consulted, discussions have been made by PEG tube placement.  Decision has been made to continue feeding by mouth with aspiration precautions, avoid peg tube. Continue palliative care approach. Patient very deconditioned, plan to transfer to SNF if tolerates well a dysphagia diet.     Assessment & Plan:   Principal Problem:   Aspiration pneumonia ([place]) Active Problems:   Diabetes mellitus ([place])   HYPERLIPIDEMIA   PROSTATE CANCER, HX OF   HTN (hypertension)   Parkinsonism ([place])   Glaucoma  Fall   Goals of care, counseling/discussion   Palliative care by specialist   DNR (do not resuscitate) discussion   Pressure injury of skin   Encounter for hospice care discussion    1. Acute metabolic encephalopathy.  Improving, supportive care  2. Sepsis due to aspiration pneumonia, (SIRS with hypotension and encephalopathy) present on admission. Blood culture positive for coag negative staph, likely a contaminant.   Wbc count at 9,5. Follow up chest radiograph with bibasilar atelectasis. Continue antibiotic therapy with Augmentin #4/5 of effective antibiotic therapy. Continue aspiration precautions, head elevated from bed 45 degrees at all times.  Continue with bronchodilator therapy.   3. AKi on CKD stage 3a/ hypernatremia. Renal function with serum cr at 1,31 K at 4,2 and bicarbonate at 22, Na 148. Will continue to encourage po intake including free water. Patient now off IV fluids.    4. Paroxysmal atrial fibrillation. Rate controlled, continue telemetry monitoring for now.   5. Dyslipidemia. Resume atorvastatin 20 mg per home regimen.    6. T2DM. This am fasting glucose is 115 mg/dl. Glucose cover and monitoring with insulin sliding scale.   7. Parkinson and dementia/ swallow dysfunction and rapid decline in functional capacity. . Poor prognosis, per family meeting with palliative care, decision made to allow patient to eat by mouth with aspiration precautions, declined peg tube. On sinemet. As needed lorazepam.   8. Bilateral heals stage 1 and sacrum stage 3 pressure ulcers. Present on admission, On local wound care.     Body mass index is 21.27 kg/m. Malnutrition Type:      Malnutrition Characteristics:      Nutrition Interventions:  RN Pressure Injury Documentation: Pressure Injury 11/20/18 Sacrum Medial Unstageable - Full thickness tissue loss in which the base of the ulcer is covered by slough (yellow, tan, gray, green or brown) and/or eschar (tan,  brown or black) in the wound bed. brown/black wound bed (Active)  11/20/18 1749  Location: Sacrum  Location Orientation: Medial  Staging: Unstageable - Full thickness tissue loss in which the base of the ulcer is covered by slough (yellow, tan, gray, green or brown) and/or eschar (tan, brown or black) in the wound bed.  Wound Description (Comments): brown/black wound bed  Present on Admission:      Pressure Injury 11/20/18 Heel Right Deep Tissue Injury - Purple or maroon localized area of discolored intact skin or blood-filled blister due to damage of underlying soft tissue from pressure and/or shear. (Active)  11/20/18 1754  Location: Heel  Location Orientation: Right  Staging: Deep Tissue Injury - Purple or maroon localized area of discolored intact skin or blood-filled blister due to damage of underlying soft tissue from pressure and/or shear.  Wound Description (Comments):   Present on Admission:      Pressure Injury 11/20/18 Heel Left;Posterior Deep Tissue Injury - Purple or maroon localized area of discolored intact skin or blood-filled blister due to damage of underlying soft tissue from pressure and/or shear. (Active)  11/20/18 2000  Location: Heel  Location Orientation: Left;Posterior  Staging: Deep Tissue Injury - Purple or maroon localized area of discolored intact skin or blood-filled blister due to damage of underlying soft tissue from pressure and/or shear.  Wound Description (Comments):   Present on Admission: Yes     Consultants:   Palliative care   Procedures:     Antimicrobials:       Subjective: Patient is confused this am, not agitates, has been tolerating po well, no nausea or vomiting, no dyspnea or chest pain.   Objective: Vitals:   11/22/18 1933 11/22/18 1936 11/23/18 0349 11/23/18 0846  BP: 97/60 97/60 107/71 112/64  Pulse: 75 75 76 71  Resp: 16 16 16 16   Temp: 98.3 F (36.8 C) 98.3 F (36.8 C) 98.8 F (37.1 C) 99.2 F (37.3 C)  TempSrc:  Oral Oral Oral Oral  SpO2: 97% 97% 97% 99%  Weight:      Height:        Intake/Output Summary (Last 24 hours) at 11/23/2018 1131 Last data filed at 11/23/2018 0900 Gross per 24 hour  Intake 1210 ml  Output 500 ml  Net 710 ml   Filed Weights   11/16/18 0315  Weight: 65.3 kg    Examination:   General: NAD  Neurology: Awake and alert, non focal. Confused but not agitated  E ENT: mild pallor, no icterus, oral mucosa moist Cardiovascular: No JVD. S1-S2 present, rhythmic, no gallops, rubs, or murmurs. No lower extremity edema. Pulmonary: positive breath sounds bilaterally, no wheezing, but scattered rhonchi with no rales. Gastrointestinal. Abdomen with no organomegaly, non tender, no rebound or guarding Skin. No rashes Musculoskeletal: no joint deformities     Data Reviewed: I have personally reviewed following labs and imaging studies  CBC: Recent Labs  Lab 11/19/18 1728 11/20/18 0500 11/21/18 0335 11/22/18 0549  WBC 9.4 8.8 8.5 9.5  NEUTROABS 7.9* 7.3 7.3 8.2*  HGB 13.3 13.2 13.4 12.0*  HCT 43.4 42.3 41.8 39.4  MCV 92.3 92.2 90.7 92.1  PLT 137* 135* 142* [id number]   Basic Metabolic Panel: Recent Labs  Lab 11/19/18 1400 11/20/18 1314 11/21/18 0335 11/22/18 0549 11/23/18 1010  NA 138 145 147* 148* 147*  K 5.0 4.8 4.5 4.2 4.2  CL 105 112* 116* 115* 117*  CO2 20* 21* 22 22 22   GLUCOSE 208* 93 118* 94 92  BUN 64* 46* 43* 45* 42*  CREATININE 2.00* 1.28* 1.23 1.31* 1.25*  CALCIUM 9.0 9.1 9.0 9.0 9.0   GFR: Estimated Creatinine Clearance: 41.4 mL/min (A) (by C-G formula based on SCr of 1.25 mg/dL (H)). Liver Function Tests: Recent Labs  Lab 11/20/18 1314  AST 351*  ALT 350*  ALKPHOS 135*  BILITOT 1.0  PROT 6.5  ALBUMIN 2.6*   No results for input(s): LIPASE, AMYLASE in the last 168 hours. No results for input(s): AMMONIA in the last 168 hours. Coagulation Profile: No results for input(s): INR, PROTIME in the last 168 hours. Cardiac Enzymes: No results  for input(s): CKTOTAL, CKMB, CKMBINDEX, TROPONINI in the last 168 hours. BNP (last 3 results) No results for input(s): PROBNP in the last 8760 hours. HbA1C: No results for input(s): HGBA1C in the last 72 hours. CBG: Recent Labs  Lab 11/22/18 1930 11/22/18 2336 11/23/18 0347 11/23/18 0722 11/23/18 1110  GLUCAP 159* 167* 101* 92 92   Lipid Profile: No results for input(s): CHOL, HDL, LDLCALC, TRIG, CHOLHDL, LDLDIRECT in the last 72 hours. Thyroid Function Tests: No results for input(s): TSH, T4TOTAL, FREET4, T3FREE, THYROIDAB in the last 72 hours. Anemia Panel: No results for input(s): VITAMINB12, FOLATE, FERRITIN, TIBC, IRON, RETICCTPCT in the last 72 hours.    Radiology Studies: I have reviewed all of the imaging during this hospital visit personally     Scheduled Meds: . amoxicillin-clavulanate  400 mg Oral Q12H  . carbidopa-levodopa  1.5 tablet Oral TID  . dorzolamide-timolol  1 drop Both Eyes BID  . enoxaparin (LOVENOX) injection  40 mg Subcutaneous Q24H  . insulin aspart  0-9 Units Subcutaneous Q4H  . latanoprost  1 drop Right Eye QHS   Continuous Infusions:   LOS: 3 days        Benito Mccreedy, MD

## 2018-11-24 LAB — BASIC METABOLIC PANEL
Anion gap: 8 (ref 5–15)
BUN: 47 mg/dL — ABNORMAL HIGH (ref 8–23)
CO2: 22 mmol/L (ref 22–32)
Calcium: 8.6 mg/dL — ABNORMAL LOW (ref 8.9–10.3)
Chloride: 118 mmol/L — ABNORMAL HIGH (ref 98–111)
Creatinine, Ser: 1.26 mg/dL — ABNORMAL HIGH (ref 0.61–1.24)
GFR calc Af Amer: >60 mL/min (ref >60)
GFR calc non Af Amer: 52 mL/min — ABNORMAL LOW (ref >60)
Glucose, Bld: 87 mg/dL (ref 70–99)
Potassium: 4.1 mmol/L (ref 3.5–5.1)
Sodium: 148 mmol/L — ABNORMAL HIGH (ref 135–145)

## 2018-11-24 LAB — GLUCOSE, CAPILLARY
Glucose-Capillary: 84 mg/dL (ref 70–99)
Glucose-Capillary: 70 mg/dL (ref 70–99)
Glucose-Capillary: 98 mg/dL (ref 70–99)

## 2018-11-24 LAB — CULTURE, BLOOD (ROUTINE X 2): Culture: NO GROWTH

## 2018-11-24 MED ORDER — MORPHINE SULFATE (CONCENTRATE) 10 MG/0.5ML PO SOLN: 2.6000 mg | ORAL | Status: DC | PRN

## 2018-11-24 MED ORDER — DEXTROSE 5 % IV SOLN: INTRAVENOUS | Status: DC

## 2018-11-24 NOTE — TOC Progression Note (Addendum)
Transition of Care [facility]) - Progression Note    Patient Details  Name: BERNARDINO MCTIERNAN MRN: [id number] Date of Birth: 10-Dec-1933  Transition of Care [facility]) CM/SW [place], [place] Phone Number: 11/24/2018, 10:17 AM  Clinical Narrative:     CSW received insurance authorization from [facility]   Reference # is [id number] Authorization # is [id number]  Approval starting today and good for 4 days. Next review date is 11/27/2018. Case Manager is Norlene Campbell and her fax number is [phone].   CSW also received PASSR. [id number] E end date is 12/24/2018.   Expected Discharge Plan: Skilled Nursing Facility Barriers to Discharge: Ship broker, [place] Forensic scientist)  Expected Discharge Plan and Services Expected Discharge Plan: [place] In-house Referral: Clinical Social Work Discharge Planning Services: CM Consult Post Acute Care Choice: [place] arrangements for the past 2 months: Single Family Home                                       Social Determinants of Health (SDOH) Interventions    Readmission Risk Interventions No flowsheet data found.
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
## 2018-11-24 NOTE — Clinical Social Work Note (Addendum)
Admissions director at [facility] contacted and provided with insurance auth and PASRR inform. [place] advised that per MD's note for today, patient not medically stable for discharge. CSW will continue to follow and facilitate discharge to [facility] once medically stable.  3:33 pm - CSW informed by MD that patient is not appropriate for SNF, needs residential hospice and he has talked to family who is in agreement. CSW informed that [facility] is the family's choice.  3:53 pm: Call made to Cheri, with [facility] and referral made. Requested information provided, including family contact information.   CSW will continue to follow, provide SW intervention services as needed and facilitate d/c to a hospice facility once approved by facility for residential hospice.   Hortensia Duffin Givens, MSW, LCSW Licensed Clinical Social Worker [place] [phone]

## 2018-11-24 NOTE — Care Management Important Message (Signed)
Important Message  Patient Details  Name: Dylan Orozco MRN: [id number] Date of Birth: 28-Mar-1933   Medicare Important Message Given:  Yes     [name] [name] 11/24/2018, 4:33 PM

## 2018-11-24 NOTE — Progress Notes (Signed)
Palliative: Conference with family related to Mr. Feagan's continued decline.  During family discussion on Friday, wife David Stall if Mr. Kalm is eligible for residential hospice at that point.  They decided to give through the weekend for improvements or declines.  It seems that Mr. Floyd has continued to decline over the last few days and is now eligible for residential hospice care.  Comfort care orders placed.  Choice offered, family is requesting [facility] for residential hospice care.   Conference with attending, bedside nursing, social work, related to patient condition, needs, request for residential hospice.  PMT to continue to follow.   Plan: Family is requesting comfort and dignity at end-of-life, residential hospice to "let nature take its course".  Contact person is sister-in-law, Nelda Severe.  She will be with Mr. Raker's wife of 16 years, Deborra Medina.  Prognosis: 2 weeks or less expected based on severity of aspiration, poor by mouth intake with worsening sodium, family's desire to focus on comfort only, frailty and functional decline.  55 minutes, extended time Quinn Axe, NP Palliative Medicine Team Team Phone # [phone] Greater than 50% of this time was spent counseling and coordinating care related to the above assessment and plan.

## 2018-11-24 NOTE — Plan of Care (Signed)
  Problem: Nutrition Goal: Patient maintains adequate hydration Outcome: Progressing Goal: Patient maintains weight Outcome: Progressing Goal: Patient/Family demonstrates understanding of diet Outcome: Progressing Goal: Patient/Family independently completes tube feeding Outcome: Progressing Goal: Patient will have no more than 5 lb weight change during LOS Outcome: Progressing Goal: Patient will utilize adaptive techniques to administer nutrition Outcome: Progressing Goal: Patient will verbalize dietary restrictions Outcome: Progressing   Problem: Education: Goal: Knowledge of General Education information will improve Description: Including pain rating scale, medication(s)/side effects and non-pharmacologic comfort measures Outcome: Progressing   Problem: Health Behavior/Discharge Planning: Goal: Ability to manage health-related needs will improve Outcome: Progressing   Problem: Clinical Measurements: Goal: Ability to maintain clinical measurements within normal limits will improve Outcome: Progressing Goal: Will remain free from infection Outcome: Progressing Goal: Diagnostic test results will improve Outcome: Progressing Goal: Respiratory complications will improve Outcome: Progressing Goal: Cardiovascular complication will be avoided Outcome: Progressing   Problem: Activity: Goal: Risk for activity intolerance will decrease Outcome: Progressing   Problem: Nutrition: Goal: Adequate nutrition will be maintained Outcome: Progressing   Problem: Coping: Goal: Level of anxiety will decrease Outcome: Progressing   Problem: Elimination: Goal: Will not experience complications related to bowel motility Outcome: Progressing Goal: Will not experience complications related to urinary retention Outcome: Progressing   Problem: Pain Managment: Goal: General experience of comfort will improve Outcome: Progressing   Problem: Safety: Goal: Ability to remain free from  injury will improve Outcome: Progressing   Problem: Skin Integrity: Goal: Risk for impaired skin integrity will decrease Outcome: Progressing

## 2018-11-24 NOTE — Plan of Care (Signed)
°  Problem: Coping: °Goal: Level of anxiety will decrease °Outcome: Progressing °  °

## 2018-11-25 MED ORDER — MORPHINE SULFATE (CONCENTRATE) 10 MG/0.5ML PO SOLN: 2.6000 mg | ORAL | Status: AC | PRN

## 2018-11-25 MED ORDER — ONDANSETRON HCL 4 MG PO TABS: 4.0000 mg | ORAL_TABLET | Freq: Four times a day (QID) | ORAL | 0 refills | Status: AC | PRN

## 2018-11-25 NOTE — Progress Notes (Signed)
Palliative Medicine RN Note: Rec'd sign out from PMT NP Quinn Axe that family is requesting transfer to [facility] in [facility]. Notified [facility] liaison Webb Silversmith, Therapist, sports.  Marjie Skiff Quaneshia Wareing, RN, BSN, [facility] Palliative Medicine Team 11/25/2018 7:55 AM Office [phone]

## 2018-11-25 NOTE — Progress Notes (Signed)
Nutrition Brief Note  Chart reviewed. Pt now transitioning to comfort care.  No further nutrition interventions warranted at this time.  Please re-consult as needed.   Joel Cowin RD, LDN Clinical Nutrition Pager # - [phone]    

## 2018-11-25 NOTE — Progress Notes (Signed)
Pt left via [facility]. Report given to Neoma Laming at [place].

## 2018-11-25 NOTE — Discharge Summary (Signed)
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 approved or cleared by the [place] FDA and  has been authorized for detection and/or diagnosis of SARS-CoV-2 by FDA under an Emergency Use Authorization (EUA). This EUA will remain  in effect (meaning this test can be used) for the duration of the COVID-19 declaration under Section 56 4(b)(1) of the Act, 21 U.S.C. section 360bbb-3(b)(1), unless the authorization is terminated or revoked sooner. Performed at [facility], [place] [address] [place], [place] [address]   Urine Culture     Status: Abnormal   Collection Time: 11/19/18  3:55 PM   Specimen: Urine, Catheterized  Result Value Ref Range Status   Specimen Description URINE, CATHETERIZED  Final   Special Requests   Final    NONE Performed at [facility], [place] [address] [place],  [place] [address]    Culture 60,000 COLONIES/mL KLEBSIELLA PNEUMONIAE (A)  Final   Report Status 11/21/2018 FINAL  Final   Organism ID, Bacteria KLEBSIELLA PNEUMONIAE (A)  Final      Susceptibility   Klebsiella pneumoniae - MIC*    AMPICILLIN RESISTANT Resistant     CEFAZOLIN <=4 SENSITIVE Sensitive     CEFTRIAXONE <=1 SENSITIVE Sensitive     CIPROFLOXACIN <=0.25 SENSITIVE Sensitive     GENTAMICIN <=1 SENSITIVE Sensitive     IMIPENEM <=0.25 SENSITIVE Sensitive     NITROFURANTOIN <=16 SENSITIVE Sensitive     TRIMETH/SULFA <=20 SENSITIVE Sensitive     AMPICILLIN/SULBACTAM <=2 SENSITIVE Sensitive     PIP/TAZO <=4 SENSITIVE Sensitive     Extended ESBL NEGATIVE Sensitive     * 60,000 COLONIES/mL KLEBSIELLA PNEUMONIAE  Blood culture (routine x 2)     Status: None   Collection Time: 11/19/18  5:29 PM   Specimen: BLOOD  Result Value Ref Range Status   Specimen Description BLOOD RIGHT ANTECUBITAL  Final   Special Requests   Final    BOTTLES DRAWN AEROBIC ONLY Blood Culture results may not be optimal due to an inadequate volume of blood received in culture bottles   Culture   Final    NO GROWTH 5 DAYS Performed at [facility], [place] [address] [place], [place] [address]    Report Status 11/24/2018 FINAL  Final  Blood culture (routine x 2)     Status: Abnormal   Collection Time: 11/19/18  5:30 PM   Specimen: BLOOD RIGHT FOREARM  Result Value Ref Range Status   Specimen Description BLOOD RIGHT FOREARM  Final   Special Requests   Final    BOTTLES DRAWN AEROBIC AND ANAEROBIC Blood Culture results may not be optimal due to an inadequate volume of blood received in culture bottles   Culture  Setup Time   Final    GRAM POSITIVE COCCI IN CLUSTERS IN BOTH AEROBIC AND ANAEROBIC BOTTLES CRITICAL RESULT CALLED TO, READ BACK BY AND VERIFIED WITH: T. BAUMEISTER PHARMD, AT 1226 11/20/18 BY D.VANHOOK    Culture (A)  Final    STAPHYLOCOCCUS SPECIES (COAGULASE NEGATIVE) THE SIGNIFICANCE OF ISOLATING  THIS ORGANISM FROM A SINGLE SET OF BLOOD CULTURES WHEN MULTIPLE SETS ARE DRAWN IS UNCERTAIN. PLEASE NOTIFY THE MICROBIOLOGY DEPARTMENT WITHIN ONE WEEK IF SPECIATION AND SENSITIVITIES ARE REQUIRED. Performed at [facility], [place] [address] [place], [place] [address]    Report Status 11/22/2018 FINAL  Final    FURTHER DISCHARGE INSTRUCTIONS:  Get Medicines reviewed and adjusted: Please take all your medications with you for your next visit with your Primary MD  Laboratory/radiological data: Please request your Primary MD to go over all hospital tests and procedure/radiological results at the follow up, please ask your Primary MD to get all Hospital records sent to his/her office.  In some cases, they will be blood work, cultures and biopsy results pending at the time of your discharge. Please request that your primary care M.D. goes through all the records of your hospital data and follows up on these results.  Also Note the following: If you experience worsening of your admission symptoms, develop shortness of breath, life threatening emergency, suicidal or homicidal thoughts you must seek medical attention immediately by calling 911 or calling your MD immediately  if symptoms less severe.  You must read complete instructions/literature along with all the possible adverse reactions/side effects for all the Medicines you take and that have been prescribed to you. Take any new Medicines after you have completely understood and accpet all the possible adverse reactions/side effects.   Do not drive when taking Pain medications or sleeping medications (Benzodaizepines)  Do not take more than prescribed Pain, Sleep and Anxiety Medications. It is not advisable to combine anxiety,sleep and pain medications without talking with your primary care practitioner  Special Instructions: If you have smoked or chewed Tobacco  in the last 2 yrs please stop smoking, stop any regular Alcohol  and or any  Recreational drug use.  Wear Seat belts while driving.  Please note: You were cared for by a hospitalist during your hospital stay. Once you are discharged, your primary care physician will handle any further medical issues. Please note that NO REFILLS for any discharge medications will be authorized once you are discharged, as it is imperative that you return to your primary care physician (or establish a relationship with a primary care physician if you do not have one) for your post hospital discharge needs so that they can reassess your need for medications and monitor your lab values.  Total Time spent coordinating discharge including counseling, education and face to face time equals 35 minutes.  SignedOren Binet 11/25/2018 9:40 AM

## 2018-11-25 NOTE — Plan of Care (Signed)
?  Problem: Nutrition ?Goal: Patient maintains adequate hydration ?Outcome: Progressing ?  ?

## 2018-11-25 NOTE — TOC Transition Note (Addendum)
Transition of Care [facility]) - CM/SW Discharge Note *11/25/18 - Discharged to [place] via ambulance   Patient Details  Name: Dylan Orozco MRN: [id number] Date of Birth: [birth year]  [facility] CM/SW Contact:  Sable Feil, LCSW Phone Number: 11/25/2018, 11:08 AM   Clinical Narrative:  Patient discharging to [facility] today, transported by [employer]. Cherie, hospice representative arranged ambulance transport and scheduled appointment with family today to complete admissions paperwork. Cherie was able to access the discharge summary and transmit it to the hospice facility. [place], admissions director at [facility] facility contacted and advised of change in discharge disposition.   Final next level of care: Hospice Medical Facility([facility]) Barriers to Discharge: No Barriers Identified   Patient Goals and CMS Choice Patient states their goals for this hospitalization and ongoing recovery are:: Family agreeable to residential hospice versus SNF placement after talking with MD CMS Medicare.gov Compare Post Acute Care list provided to:: Patient Represenative (must comment)(Family was involved in SNF placement) Choice offered to / list presented to : Adult Children  Discharge Placement PASRR number recieved: 11/24/18            Patient chooses bed at: (Family chose [facility]) Patient to be transferred to facility by: Ambulance Name of family member notified: Family aware of discharge plan and was notified by Hospice rep. of transport arrangement Patient and family notified of of transfer: 11/25/18  Discharge Plan and Services In-house Referral: Clinical Social Work Discharge Planning Services: CM Consult Post Acute Care Choice: [place]                             Social Determinants of Health (SDOH) Interventions  No SDOH interventions needed   Readmission Risk  Interventions No flowsheet data found.

## 2018-11-26 ENCOUNTER — Telehealth: Payer: Medicare Other | Admitting: Neurology

## 2018-11-27 ENCOUNTER — Telehealth: Payer: Self-pay

## 2018-11-27 NOTE — Telephone Encounter (Signed)
Palliative Medicine RN Note: Rec'd a call from [place] re letter for Dylan Orozco, who was discharged 11/3. Family needs a letter stating he lacks capacity to manage financial matters. PMT NP Tasha, who was working with him, is not at this facility this week. The attending who had him has likewise gone off service. Spoke w SW at [facility]; she is willing to help with the letter & asked family to call her.   I called family back with the update; they are going to [facility] and will follow up with the SW when they get there. Confirmed that there is no letter in the chart, at the front desk or in the nurses station.  Marjie Skiff Edu On, RN, BSN, [facility] Palliative Medicine Team 11/27/2018 9:01 AM Office [phone]

## 2018-12-04 ENCOUNTER — Telehealth: Payer: Self-pay

## 2018-12-04 NOTE — Telephone Encounter (Signed)
Pt passed away on December 28, 2018.

## 2018-12-04 NOTE — Telephone Encounter (Signed)
Noted  

## 2018-12-23 DEATH — deceased
